# Patient Record
Sex: Female | Born: 1935 | Race: Black or African American | Hispanic: No
Health system: Midwestern US, Community
[De-identification: ages and names within clinical notes are randomized; demographics above are authoritative.]

## PROBLEM LIST (undated history)

## (undated) DIAGNOSIS — I10 Essential (primary) hypertension: Secondary | ICD-10-CM

## (undated) HISTORY — PX: APPENDECTOMY: SHX54

---

## 2014-07-15 ENCOUNTER — Emergency Department (HOSPITAL_COMMUNITY)
Admission: EM | Admit: 2014-07-15 | Discharge: 2014-07-16 | Disposition: A | Discharge status: Home / Self Care | Payer: Medicare Other | Attending: Emergency Medicine | Admitting: Emergency Medicine

## 2014-07-15 ENCOUNTER — Encounter (HOSPITAL_COMMUNITY): Payer: Self-pay | Admitting: Emergency Medicine

## 2014-07-15 DIAGNOSIS — R1013 Epigastric pain: Secondary | ICD-10-CM | POA: Insufficient documentation

## 2014-07-15 DIAGNOSIS — R079 Chest pain, unspecified: Secondary | ICD-10-CM | POA: Diagnosis present

## 2014-07-15 DIAGNOSIS — R1011 Right upper quadrant pain: Secondary | ICD-10-CM | POA: Diagnosis not present

## 2014-07-15 HISTORY — DX: Essential (primary) hypertension: I10

## 2014-07-15 LAB — I-STAT TROPONIN, ED: Troponin i, poc: 0 ng/mL (ref 0.00–0.08)

## 2014-07-15 MED ORDER — PANTOPRAZOLE SODIUM 40 MG IV SOLR
40.0000 mg | Freq: Once | INTRAVENOUS | Status: AC
  Administered 2014-07-16: 40 mg via INTRAVENOUS
  Filled 2014-07-15: qty 40

## 2014-07-15 MED ORDER — ONDANSETRON HCL 4 MG/2ML IJ SOLN
4.0000 mg | Freq: Once | INTRAMUSCULAR | Status: AC
  Administered 2014-07-16: 4 mg via INTRAVENOUS
  Filled 2014-07-15: qty 2

## 2014-07-15 MED ORDER — FENTANYL CITRATE 0.05 MG/ML IJ SOLN
50.0000 ug | Freq: Once | INTRAMUSCULAR | Status: AC
  Administered 2014-07-16: 50 ug via INTRAVENOUS
  Filled 2014-07-15: qty 2

## 2014-07-15 NOTE — ED Notes (Signed)
Pt. reports intermittent epigastric and substernal chest pressure onset this evening with emesis x1, denies SOB , no diaphoresis , pt. stated recent death in the family , pt. Received 1 NTG sl and 4 baby ASA by EMS .

## 2014-07-15 NOTE — ED Provider Notes (Signed)
This chart was scribed for Jenny Maw Zelma Mazariego, DO by Arlan Organ, ED Scribe. This patient was seen in room B17C/B17C and the patient's care was started 11:44 PM.  TIME SEEN: 11:44 PM   CHIEF COMPLAINT:  Chief Complaint  Patient presents with  . Chest Pain     HPI:   HPI Comments: [place] brought in by ambulance is a 81 y.o. female with a PMHx of GERD and HTN who presents to the Emergency Department complaining of intermittent, ongoing, gradually worsening RUQ and eigastric abdominal pain/lower chest pain that radiates to the back onset 7:00 PM this evening. Pain described as "gas pain"/pressure. Discomfort is exacerbated with certain movements and mildly alleviated after vomiting. She also reports nausea and 3 episodes of vomiting since onset. Pt was given Nitro and aspirin en route to ED without any relief for symptoms. No SOB, diaphoresis, dizziness at this time. No history of heart disease or heart failure. No previous cardiac catheterization or stress test. She denies any history of blood clots. She is not a smoker. No PSHx of abdominal surgery other than appendectomy. Pt [place]. States she is planning on staying in [place] for an extended period of time. No known allergies to medications.  ROS: See HPI Constitutional: no fever  Eyes: no drainage  ENT: no runny nose   Cardiovascular:  Positive for chest pain  Resp: no SOB  GI: Positive for nausea and vomiting GU: no dysuria Integumentary: no rash  Allergy: no hives  Musculoskeletal: no leg swelling  Neurological: no slurred speech or dizziness ROS otherwise negative  PAST MEDICAL HISTORY/PAST SURGICAL HISTORY:  No past medical history on file.  MEDICATIONS:  Prior to Admission medications   Not on File    ALLERGIES:  Allergies not on file  SOCIAL HISTORY:  History  Substance Use Topics  . Smoking status: Not on file  . Smokeless tobacco: Not on file  . Alcohol Use: Not on  file    FAMILY HISTORY: No family history on file.  EXAM: There were no vitals taken for this visit. CONSTITUTIONAL: Alert and oriented and responds appropriately to questions. Well-appearing; well-nourished HEAD: Normocephalic EYES: Conjunctivae clear, PERRL ENT: normal nose; no rhinorrhea; moist mucous membranes; pharynx without lesions noted NECK: Supple, no meningismus, no LAD  CARD: RRR; S1 and S2 appreciated; no murmurs, no clicks, no rubs, no gallops RESP: Normal chest excursion without splinting or tachypnea; breath sounds clear and equal bilaterally; no wheezes, no rhonchi, no rales,  ABD/GI: Normal bowel sounds; non-distended; soft; Tenderness to palpation over RUQ with no guarding or rebounding; no peritoneal signs; negative Murphy's sign BACK:  The back appears normal and is non-tender to palpation, there is no CVA tenderness EXT: Normal ROM in all joints; non-tender to palpation; no edema; normal capillary refill; no cyanosis; no calf tenderness or swelling, 2+ DP pulses bilaterally, 2+ radial pulses bilaterally   SKIN: Normal color for age and race; warm NEURO: Moves all extremities equally PSYCH: The patient's mood and manner are appropriate. Grooming and personal hygiene are appropriate.  MEDICAL DECISION MAKING: Patient here with right upper quadrant and epigastric pain that radiates into her chest and back. She has a heart score of 3 secondary to age and history of her blood pressure. Will obtain cardiac labs. EKG shows nonspecific T-wave abnormalities with no old for comparison. This does appear to be gastric in nature versus gallbladder in origin. We'll obtain LFTs, lipase. We'll obtain chest x-ray, right  upper quadrant ultrasound. We'll give Zofran, Protonix, fentanyl and reassess. Have discussed with patient possibility of admission for chest pain rule out she declines this.  ED PROGRESS: Patient's labs are unremarkable including normal LFTs, lipase. 2 negative  troponins. Chest x-ray clear. Abdominal ultrasound normal. Pain completely resolved after GI cocktail. Have offered admission but patient and daughter declined. We'll discharge patient on Protonix. We'll give gastroneurology follow-up information as she may have gastritis. Denies bloody stools or melena. No heavy NSAID or alcohol use. I feel that this is less likely ACS. Discussed strict return precautions. She verbalized understanding and discomfort with plan. [facility] given she is not planning on going back to [place] anytime soon.     EKG Interpretation  Date/Time:  Saturday July 15 2014 23:46:56 EDT Ventricular Rate:  58 PR Interval:  178 QRS Duration: 76 QT Interval:  450 QTC Calculation: 441 R Axis:   -19 Text Interpretation:  Sinus bradycardia Nonspecific T wave abnormality Abnormal ECG No old tracing to compare Confirmed by Ramzi Brathwaite,  DO, Khaliyah Northrop ([id number]) on 07/15/2014 11:55:42 PM       I personally performed the services described in this documentation, which was scribed in my presence. The recorded information has been reviewed and is accurate.   Jenny MawKristen N Ohm Dentler, DO 07/16/14 [phone]

## 2014-07-16 ENCOUNTER — Emergency Department (HOSPITAL_COMMUNITY): Payer: Medicare Other

## 2014-07-16 LAB — CBC WITH DIFFERENTIAL/PLATELET
BASOS PCT: 0 % (ref 0–1)
Basophils Absolute: 0 10*3/uL (ref 0.0–0.1)
Eosinophils Absolute: 0.3 10*3/uL (ref 0.0–0.7)
Eosinophils Relative: 5 % (ref 0–5)
HEMATOCRIT: 36.8 % (ref 36.0–46.0)
HEMOGLOBIN: 12.9 g/dL (ref 12.0–15.0)
Lymphocytes Relative: 32 % (ref 12–46)
Lymphs Abs: 1.7 10*3/uL (ref 0.7–4.0)
MCH: 28 pg (ref 26.0–34.0)
MCHC: 35.1 g/dL (ref 30.0–36.0)
MCV: 79.8 fL (ref 78.0–100.0)
MONO ABS: 0.5 10*3/uL (ref 0.1–1.0)
MONOS PCT: 9 % (ref 3–12)
Neutro Abs: 2.9 10*3/uL (ref 1.7–7.7)
Neutrophils Relative %: 54 % (ref 43–77)
PLATELETS: 142 10*3/uL — AB (ref 150–400)
RBC: 4.61 MIL/uL (ref 3.87–5.11)
RDW: 13.4 % (ref 11.5–15.5)
WBC: 5.4 10*3/uL (ref 4.0–10.5)

## 2014-07-16 LAB — COMPREHENSIVE METABOLIC PANEL
ALBUMIN: 3.8 g/dL (ref 3.5–5.2)
ALT: 22 U/L (ref 0–35)
AST: 31 U/L (ref 0–37)
Alkaline Phosphatase: 54 U/L (ref 39–117)
Anion gap: 9 (ref 5–15)
BUN: 11 mg/dL (ref 6–23)
CALCIUM: 8.9 mg/dL (ref 8.4–10.5)
CO2: 25 mmol/L (ref 19–32)
Chloride: 103 mmol/L (ref 96–112)
Creatinine, Ser: 0.93 mg/dL (ref 0.50–1.10)
GFR calc Af Amer: 64 mL/min — ABNORMAL LOW (ref >90)
GFR calc non Af Amer: 55 mL/min — ABNORMAL LOW (ref >90)
Glucose, Bld: 127 mg/dL — ABNORMAL HIGH (ref 70–99)
Potassium: 3.6 mmol/L (ref 3.5–5.1)
Sodium: 137 mmol/L (ref 135–145)
TOTAL PROTEIN: 6.8 g/dL (ref 6.0–8.3)
Total Bilirubin: 0.4 mg/dL (ref 0.3–1.2)

## 2014-07-16 LAB — I-STAT TROPONIN, ED: Troponin i, poc: 0 ng/mL (ref 0.00–0.08)

## 2014-07-16 LAB — BRAIN NATRIURETIC PEPTIDE: B Natriuretic Peptide: 32 pg/mL (ref 0.0–100.0)

## 2014-07-16 LAB — LIPASE, BLOOD: LIPASE: 23 U/L (ref 11–59)

## 2014-07-16 MED ORDER — GI COCKTAIL ~~LOC~~
30.0000 mL | Freq: Once | ORAL | Status: AC
  Administered 2014-07-16: 30 mL via ORAL
  Filled 2014-07-16: qty 30

## 2014-07-16 MED ORDER — FENTANYL CITRATE 0.05 MG/ML IJ SOLN
INTRAMUSCULAR | Status: AC
  Filled 2014-07-16: qty 2

## 2014-07-16 MED ORDER — PANTOPRAZOLE SODIUM 40 MG PO TBEC: 40.0000 mg | DELAYED_RELEASE_TABLET | Freq: Every day | ORAL | Status: AC

## 2014-07-16 NOTE — Discharge Instructions (Signed)
Gastritis, Adult Gastritis is soreness and swelling (inflammation) of the lining of the stomach. Gastritis can develop as a sudden onset (acute) or long-term (chronic) condition. If gastritis is not treated, it can lead to stomach bleeding and ulcers. CAUSES  Gastritis occurs when the stomach lining is weak or damaged. Digestive juices from the stomach then inflame the weakened stomach lining. The stomach lining may be weak or damaged due to viral or bacterial infections. One common bacterial infection is the Helicobacter pylori infection. Gastritis can also result from excessive alcohol consumption, taking certain medicines, or having too much acid in the stomach.  SYMPTOMS  In some cases, there are no symptoms. When symptoms are present, they may include:  Pain or a burning sensation in the upper abdomen.  Nausea.  Vomiting.  An uncomfortable feeling of fullness after eating. DIAGNOSIS  Your caregiver may suspect you have gastritis based on your symptoms and a physical exam. To determine the cause of your gastritis, your caregiver may perform the following:  Blood or stool tests to check for the H pylori bacterium.  Gastroscopy. A thin, flexible tube (endoscope) is passed down the esophagus and into the stomach. The endoscope has a light and camera on the end. Your caregiver uses the endoscope to view the inside of the stomach.  Taking a tissue sample (biopsy) from the stomach to examine under a microscope. TREATMENT  Depending on the cause of your gastritis, medicines may be prescribed. If you have a bacterial infection, such as an H pylori infection, antibiotics may be given. If your gastritis is caused by too much acid in the stomach, H2 blockers or antacids may be given. Your caregiver may recommend that you stop taking aspirin, ibuprofen, or other nonsteroidal anti-inflammatory drugs (NSAIDs). HOME CARE INSTRUCTIONS  Only take over-the-counter or prescription medicines as directed by  your caregiver.  If you were given antibiotic medicines, take them as directed. Finish them even if you start to feel better.  Drink enough fluids to keep your urine clear or pale yellow.  Avoid foods and drinks that make your symptoms worse, such as:  Caffeine or alcoholic drinks.  Chocolate.  Peppermint or mint flavorings.  Garlic and onions.  Spicy foods.  Citrus fruits, such as oranges, lemons, or limes.  Tomato-based foods such as sauce, chili, salsa, and pizza.  Fried and fatty foods.  Eat small, frequent meals instead of large meals. SEEK IMMEDIATE MEDICAL CARE IF:   You have black or dark red stools.  You vomit blood or material that looks like coffee grounds.  You are unable to keep fluids down.  Your abdominal pain gets worse.  You have a fever.  You do not feel better after 1 week.  You have any other questions or concerns. MAKE SURE YOU:  Understand these instructions.  Will watch your condition.  Will get help right away if you are not doing well or get worse. Document Released: 03/25/2001 Document Revised: 09/30/2011 Document Reviewed: 05/14/2011 [facility] Patient Information 2015 [place], [place]. This information is not intended to replace advice given to you by your health care provider. Make sure you discuss any questions you have with your health care provider.   Food Choices for Gastroesophageal Reflux Disease When you have gastroesophageal reflux disease (GERD), the foods you eat and your eating habits are very important. Choosing the right foods can help ease the discomfort of GERD. WHAT GENERAL GUIDELINES DO I NEED TO FOLLOW?  Choose fruits, vegetables, whole grains, low-fat dairy products, and  low-fat meat, fish, and poultry.  Limit fats such as oils, salad dressings, butter, nuts, and avocado.  Keep a food diary to identify foods that cause symptoms.  Avoid foods that cause reflux. These may be different for different people.  Eat  frequent small meals instead of three large meals each day.  Eat your meals slowly, in a relaxed setting.  Limit fried foods.  Cook foods using methods other than frying.  Avoid drinking alcohol.  Avoid drinking large amounts of liquids with your meals.  Avoid bending over or lying down until 2-3 hours after eating. WHAT FOODS ARE NOT RECOMMENDED? The following are some foods and drinks that may worsen your symptoms: Vegetables Tomatoes. Tomato juice. Tomato and spaghetti sauce. Chili peppers. Onion and garlic. Horseradish. Fruits Oranges, grapefruit, and lemon (fruit and juice). Meats High-fat meats, fish, and poultry. This includes hot dogs, ribs, ham, sausage, salami, and bacon. Dairy Whole milk and chocolate milk. Sour cream. Cream. Butter. Ice cream. Cream cheese.  Beverages Coffee and tea, with or without caffeine. Carbonated beverages or energy drinks. Condiments Hot sauce. Barbecue sauce.  Sweets/Desserts Chocolate and cocoa. Donuts. Peppermint and spearmint. Fats and Oils High-fat foods, including [place] fries and potato chips. Other Vinegar. Strong spices, such as black pepper, white pepper, red pepper, cayenne, curry powder, cloves, ginger, and chili powder. The items listed above may not be a complete list of foods and beverages to avoid. Contact your dietitian for more information. Document Released: 03/31/2005 Document Revised: 04/05/2013 Document Reviewed: 02/02/2013 [facility] Patient Information 2015 [place], [place]. This information is not intended to replace advice given to you by your health care provider. Make sure you discuss any questions you have with your health care provider.   Chest Pain (Nonspecific) It is often hard to give a specific diagnosis for the cause of chest pain. There is always a chance that your pain could be related to something serious, such as a heart attack or a blood clot in the lungs. You need to follow up with your health care  provider for further evaluation. CAUSES   Heartburn.  Pneumonia or bronchitis.  Anxiety or stress.  Inflammation around your heart (pericarditis) or lung (pleuritis or pleurisy).  A blood clot in the lung.  A collapsed lung (pneumothorax). It can develop suddenly on its own (spontaneous pneumothorax) or from trauma to the chest.  Shingles infection (herpes zoster virus). The chest wall is composed of bones, muscles, and cartilage. Any of these can be the source of the pain.  The bones can be bruised by injury.  The muscles or cartilage can be strained by coughing or overwork.  The cartilage can be affected by inflammation and become sore (costochondritis). DIAGNOSIS  Lab tests or other studies may be needed to find the cause of your pain. Your health care provider may have you take a test called an ambulatory electrocardiogram (ECG). An ECG records your heartbeat patterns over a 24-hour period. You may also have other tests, such as:  Transthoracic echocardiogram (TTE). During echocardiography, sound waves are used to evaluate how blood flows through your heart.  Transesophageal echocardiogram (TEE).  Cardiac monitoring. This allows your health care provider to monitor your heart rate and rhythm in real time.  Holter monitor. This is a portable device that records your heartbeat and can help diagnose heart arrhythmias. It allows your health care provider to track your heart activity for several days, if needed.  Stress tests by exercise or by giving medicine that makes the  heart beat faster. TREATMENT   Treatment depends on what may be causing your chest pain. Treatment may include:  Acid blockers for heartburn.  Anti-inflammatory medicine.  Pain medicine for inflammatory conditions.  Antibiotics if an infection is present.  You may be advised to change lifestyle habits. This includes stopping smoking and avoiding alcohol, caffeine, and chocolate.  You may be advised  to keep your head raised (elevated) when sleeping. This reduces the chance of acid going backward from your stomach into your esophagus. Most of the time, nonspecific chest pain will improve within 2-3 days with rest and mild pain medicine.  HOME CARE INSTRUCTIONS   If antibiotics were prescribed, take them as directed. Finish them even if you start to feel better.  For the next few days, avoid physical activities that bring on chest pain. Continue physical activities as directed.  Do not use any tobacco products, including cigarettes, chewing tobacco, or electronic cigarettes.  Avoid drinking alcohol.  Only take medicine as directed by your health care provider.  Follow your health care provider's suggestions for further testing if your chest pain does not go away.  Keep any follow-up appointments you made. If you do not go to an appointment, you could develop lasting (chronic) problems with pain. If there is any problem keeping an appointment, call to reschedule. SEEK MEDICAL CARE IF:   Your chest pain does not go away, even after treatment.  You have a rash with blisters on your chest.  You have a fever. SEEK IMMEDIATE MEDICAL CARE IF:   You have increased chest pain or pain that spreads to your arm, neck, jaw, back, or abdomen.  You have shortness of breath.  You have an increasing cough, or you cough up blood.  You have severe back or abdominal pain.  You feel nauseous or vomit.  You have severe weakness.  You faint.  You have chills. This is an emergency. Do not wait to see if the pain will go away. Get medical help at once. Call your local emergency services (911 in [place] Do not drive yourself to the hospital. MAKE SURE YOU:   Understand these instructions.  Will watch your condition.  Will get help right away if you are not doing well or get worse. Document Released: 01/08/2005 Document Revised: 04/05/2013 Document Reviewed: 11/04/2007 [facility] Patient  Information 2015 [place], [place]. This information is not intended to replace advice given to you by your health care provider. Make sure you discuss any questions you have with your health care provider.

## 2014-07-16 NOTE — ED Notes (Signed)
Patient transported to X-ray 

## 2014-07-16 NOTE — ED Notes (Signed)
MD at bedside. 

## 2015-10-28 NOTE — Nursing Note (Signed)
Nursing Discharge Summary - Text       Nursing Discharge Summary Entered On:  10/28/2015 10:13 EDT    Performed On:  10/28/2015 10:12 EDT by Daphine DeutscherMARTIN, RN, Apolinar JunesBRANDON               DC Information   Discharge To, Anticipated :   Home independently   Mode of Discharge :   Ambulatory   Transportation :   Private vehicle   Accompanied By :   Family member   [place], RN, Apolinar JunesBRANDON - 10/28/2015 10:12 EDT   Education   Responsible Learner(s) :   No Data Available     Barriers To Learning :   None evident   Teaching Method :   Explanation, Printed materials   Roylene ReasonMARTIN, RN, BRANDON - 10/28/2015 10:12 EDT   Post-Hospital Education Adult Grid   Importance of Follow-Up Visits :   Verbalizes understanding   Pain Management :   Verbalizes understanding   Plan of Care :   Verbalizes understanding   [place], RApolinar Junes, BRANDON - 10/28/2015 10:12 EDT   Medication Education Adult Grid   Med Dosage, Route, Scheduling :   TEFL teacherVerbalizes understanding   Med Generic/Brand Name, Purpose, Action :   Verbalizes understanding   Medication Precautions :   Verbalizes understanding   Daphine DeutscherMARTIN, RApolinar Junes, BRANDON - 10/28/2015 10:12 EDT   Education Referral Made To :   Physician Specialist, Primary Care Physician   [place], RN, Apolinar JunesBRANDON - 10/28/2015 10:12 EDT

## 2017-06-24 NOTE — Nursing Note (Signed)
Orthostatics - Text       Orthostatics Entered On:  06/24/2017 18:54 EDT    Performed On:  06/24/2017 18:45 EDT by Patrick NorthAllen,  Katie H               Orthostatics   Systolic/Diastolic  Supine BP :   228 mmHg (>HHI)    Systolic/Diastolic  Supine BP :   100 mmHg (HI)    Pulse Supine :   66 bpm   Systolic/Diastolic  Standing BP :   210 mmHg (>HHI)    Systolic/Diastolic  Standing BP :   103 mmHg (>HHI)    Pulse Standing :   69 bpm   Systolic/Diastolic  Sitting BP :   216 mmHg (>HHI)    Systolic/Diastolic  Sitting BP :   98 mmHg (HI)    Pulse Sitting :   68 bpm   Comments :   pt got lightheaded on standing   Patrick Northllen,  Katie H - 06/24/2017 18:45 EDT

## 2017-06-24 NOTE — Nursing Note (Signed)
Medication Administration Follow Up-Text       Medication Administration Follow Up Entered On:  06/24/2017 21:42 EDT    Performed On:  06/24/2017 21:42 EDT by Donivan Scull, RN, Anderson Malta      Intervention Information:     ondansetron  Performed by Ace, RN, Anderson Malta on 06/24/2017 21:11:00 EDT       ondansetron,4mg   IV Push,Wrist, Right       Med Response   ED Medication Response :   No adverse reaction, Symptoms improved   Ace, RN, Anderson Malta - 06/24/2017 21:42 EDT

## 2017-06-25 NOTE — Progress Notes (Signed)
Outpatient OT Certification Letter-Text       OT Outpatient Certification Letter Entered On:  06/25/2017 10:53 EDT    Performed On:  06/25/2017 10:51 EDT by Crisoforo OxfordAMMONS, OT, STEPHEN               Physician Certification   Date of Injury or Surgery :   06/24/2017 EDT   Ordering Physician Name :   Kerin SalenGOYAL-MD,  ABHINAV   Number of Visits This Interval :   1   Dear Physician :   Thank you for your referral. Below is the patient information for the stated interval of treatment. Please review, modify (if necessary), sign, and return. Thank you.   Date of Evaluation :   06/25/2017 EDT   OT Certification Interval End :   07/26/2017 EDT   Physician Signature Required :   Yes   Staff Physician Signature :   The physician's electronic signature noted above indicates approval of the documented Plan of Care for the stated interval.   AMMONS, OT, STEPHEN - 06/25/2017 10:51 EDT   Problem List   (As Of: 06/25/2017 10:53:01 EDT)   Problems(Active)    At risk for falls (SNOMED CT  :[id number] )  Name of Problem:   At risk for falls ; Onset Date:   06/25/2017 ; Recorder:   SYSTEM,  SYSTEM; Confirmation:   Confirmed ; Classification:   Interdisciplinary ; Code:   [id number] ; Last Updated:   06/25/2017 2:38 EDT ; Life Cycle Date:   06/25/2017 ; Life Cycle Status:   Active ; Vocabulary:   SNOMED CT   ; Comments:        06/25/2017 2:38 - SYSTEM,  SYSTEM  Problem added by Discern Expert      HLD (hyperlipidemia) (SNOMED CT  :[id number] )  Name of Problem:   HLD (hyperlipidemia) ; Recorder:   BELLEW, RN, KRISTY M; Confirmation:   Confirmed ; Classification:   Patient Stated ; Code:   [id number] ; Contributor System:   PowerChart ; Last Updated:   06/24/2017 16:16 EDT ; Life Cycle Date:   06/24/2017 ; Life Cycle Status:   Active ; Vocabulary:   SNOMED CT        HTN (hypertension) (SNOMED CT  [id number] )  Name of Problem:   HTN (hypertension) ; Recorder:   Daphine DeutscherMARTIN, RN, Apolinar JunesBRANDON; Confirmation:   Confirmed ; Classification:   Medical ; Code:    [id number] ; Contributor System:   PowerChart ; Last Updated:   10/28/2015 8:22 EDT ; Life Cycle Date:   10/28/2015 ; Life Cycle Status:   Active ; Vocabulary:   SNOMED CT          Diagnoses(Active)    Dizziness  Date:   06/24/2017 ; Diagnosis Type:   Reason For Visit ; Confirmation:   Complaint of ; Clinical Dx:   Dizziness ; Classification:   Medical ; Clinical Service:   Emergency medicine ; Code:   PNED ; Probability:   0 ; Diagnosis Code:   [id number]      Dizziness  Date:   06/24/2017 ; Diagnosis Type:   Discharge ; Confirmation:   Confirmed ; Clinical Dx:   Dizziness ; Classification:   Medical ; Clinical Service:   Non-Specified ; Code:   ICD-10-CM ; Probability:   0 ; Diagnosis Code:   R42      Epigastric pain  Date:   06/24/2017 ; Diagnosis Type:   Discharge ; Confirmation:   Confirmed ; Clinical Dx:  Epigastric pain ; Classification:   Medical ; Clinical Service:   Non-Specified ; Code:   ICD-10-CM ; Probability:   0 ; Diagnosis Code:   R10.13      Unsteadiness on feet  Date:   06/25/2017 ; Diagnosis Type:   Other ; Confirmation:   Differential ; Clinical Dx:   Unsteadiness on feet ; Classification:   Interdisciplinary ; Clinical Service:   Non-Specified ; Code:   ICD-10-CM ; Probability:   0 ; Diagnosis Code:   R26.81        Plan   OT Evaluation Date :   06/25/2017 EDT   OT Frequency Rehab :   Mo/Tu/We/Th/Fr   OT Duration Unit Rehab :   Days   OT Duration Rehab :   30   OT Anticipated Treatments :   Activity of daily living training, Balance training, Mobility training, Pain management, Patient/Caregiver education, Safety education, Therapeutic exercises   OT Certification Letter Complete :   Yes   AMMONS, OT, STEPHEN - 06/25/2017 10:51 EDT   Outpatient Review   Rehab Potential Occupational Therapy :   Good   OT Impairments or Limitations :   Balance deficits, Basic activity of daily living deficits, Cognitive deficits, Endurance deficits, Mobility deficits,  Pain, Strength deficits   OT Clinical Assessment Summary :   Pt may benefit from rehab upon d/c.     AMMONS, OT, STEPHEN - 06/25/2017 10:51 EDT    Signature Line                                                 Electronically Signed On 06/25/17 10:51 AM                                               _________________________________________________                                               Austin Miles, STEPHEN                                                     Electronically Signed On 07/03/17 07:51 PM                                               _________________________________________________                                                Kerin Salen                  Dicatation Date: 06/25/17 10:51 AM

## 2017-06-25 NOTE — Nursing Note (Signed)
Adult Patient History Form-Text       [name]:  06/25/2017 1:05 EDT    Performed On:  06/25/2017 1:01 EDT by Sharol Roussel, RN, [place]   Patient Identified :   Identification band, Verbal   Information Given By :   Self   Preferred Mode of Communication :   Verbal   Accompanied By :   Denman George   Pregnancy Status :   N/A   Has the patient received chemotherapy or biotherapy within the last 48 hours? :   No   In Clinical Trial With Signed Consent for Related Condition :   N/A   Is the patient currently (2-3 days) receiving radiation treatment? :   No   Sharol Roussel, RN, Collene Schlichter - 06/25/2017 1:01 EDT   Allergies   (As Of: 06/25/2017 01:05:08 EDT)   Allergies (Active)   Chocolate  Estimated Onset Date:   Unspecified ; Created By:   Montez Morita RN, KRISTINA A; Reaction Status:   Active ; Category:   Drug ; Substance:   Chocolate ; Type:   Allergy ; Severity:   Moderate ; Updated By:   Montez Morita RN, Caryl Never; Reviewed Date:   06/25/2017 1:01 EDT      Peaches  Estimated Onset Date:   Unspecified ; Created By:   Montez Morita RN, KRISTINA A; Reaction Status:   Active ; Category:   Drug ; Substance:   Peaches ; Type:   Allergy ; Severity:   Moderate ; Updated By:   Montez Morita RN, Caryl Never; Reviewed Date:   06/25/2017 1:01 EDT        Medication History   Medication List   (As Of: 06/25/2017 01:05:08 EDT)   Normal Order    A Patient Specific Medication  :   A Patient Specific Medication ; Status:   Ordered ; Ordered As Mnemonic:   A Patient Specific Medication ; Simple Display Line:   1 EA, Kit-Combo, q2mn, PRN: other (see comment) ; Ordering Provider:   GTyron Russell Catalog Code:   A Patient Specific Medication ; Order Dt/Tm:   06/25/2017 00:57:39 ; Comment:   to access the patient specific medication drawer          A Patient Specific Refrigerated Medication  :   A Patient Specific Refrigerated Medication ; Status:   Ordered ; Ordered As Mnemonic:   A Patient Specific Refrigerated Medication ;  Simple Display Line:   1 EA, Kit-Combo, q566m, PRN: other (see comment) ; Ordering Provider:   GOTyron RussellCatalog Code:   A Patient Specific Refrigerated Medicati ; Order Dt/Tm:   06/25/2017 00:57:40 ; Comment:   to access the patient specific Refrigerated medications          acetaminophen 325 mg Tab  :   acetaminophen 325 mg Tab ; Status:   Ordered ; Ordered As Mnemonic:   acetaminophen ; Simple Display Line:   650 mg, 2 tabs, Oral, q4hr, PRN: mild pain (1-3) ; Ordering Provider:   GOTyron RussellCatalog Code:   acetaminophen ; Order Dt/Tm:   06/25/2017 00:11:29          aluminum hydroxide/magnesium hydroxide/simethicone 200 mg-200 mg-20 mg/5 mL  :   aluminum hydroxide/magnesium hydroxide/simethicone 200 mg-200 mg-20 mg/5 mL ; Status:   Ordered ; Ordered As Mnemonic:   aluminum hydroxide/magnesium hydroxide/simethicone 200 mg-200 mg-20 mg/5 mL oral suspension ;  Simple Display Line:   30 mL, Oral, q3hr, PRN: indigestion ; Ordering Provider:   Tyron Russell; Catalog Code:   Al hydroxide/Mg hydroxide/simethicone ; Order Dt/Tm:   06/25/2017 00:11:30          amLODIPine 5 mg Tab  :   amLODIPine 5 mg Tab ; Status:   Ordered ; Ordered As Mnemonic:   amLODIPine ; Simple Display Line:   10 mg, 2 tabs, Oral, Daily ; Ordering Provider:   Tyron Russell; Catalog Code:   amLODIPine ; Order Dt/Tm:   06/25/2017 00:10:05          famotidine 10 mg/mL IV Soln 2 mL  :   famotidine 10 mg/mL IV Soln 2 mL ; Status:   Completed ; Ordered As Mnemonic:   Pepcid ; Simple Display Line:   20 mg, 2 mL, IV Push, Once ; Ordering Provider:   YOUNG, DO, KELLI M; Catalog Code:   famotidine ; Order Dt/Tm:   06/24/2017 20:57:24          hydrALAZINE 25 mg Tab  :   hydrALAZINE 25 mg Tab ; Status:   Ordered ; Ordered As Mnemonic:   hydrALAZINE ; Simple Display Line:   50 mg, 2 tabs, Oral, TID ; Ordering Provider:   Tyron Russell; Catalog Code:   hydrALAZINE ; Order Dt/Tm:   06/25/2017 00:09:59 ; Comment:   SOUND ALIKE / LOOK ALIKE  - VERIFY DRUG          hydrALAZINE 20 mg/mL Inj Soln 1 mL  :   hydrALAZINE 20 mg/mL Inj Soln 1 mL ; Status:   Ordered ; Ordered As Mnemonic:   hydrALAZINE ; Simple Display Line:   10 mg, 0.5 mL, IV Push, q6hr, PRN: hypertension ; Ordering Provider:   Tyron Russell; Catalog Code:   hydrALAZINE ; Order Dt/Tm:   06/25/2017 00:10:25 ; Comment:   Provider should specify BP parameters at order entry to meet [facility] standards  SOUND ALIKE / LOOK ALIKE - VERIFY DRUG    give for sbp>170          hydrALAZINE 20 mg/mL Inj Soln 1 mL  :   hydrALAZINE 20 mg/mL Inj Soln 1 mL ; Status:   Completed ; Ordered As Mnemonic:   hydrALAZINE ; Simple Display Line:   10 mg, 0.5 mL, IV Push, Once ; Ordering Provider:   YOUNG, DO, KELLI M; Catalog Code:   hydrALAZINE ; Order Dt/Tm:   06/24/2017 22:03:00          hydrALAZINE 20 mg/mL Inj Soln 1 mL  :   hydrALAZINE 20 mg/mL Inj Soln 1 mL ; Status:   Completed ; Ordered As Mnemonic:   hydrALAZINE ; Simple Display Line:   10 mg, 0.5 mL, IV Push, Once ; Ordering Provider:   YOUNG, DO, KELLI M; Catalog Code:   hydrALAZINE ; Order Dt/Tm:   06/24/2017 19:00:53          lidocaine 1% PF Inj Soln 2 mL  :   lidocaine 1% PF Inj Soln 2 mL ; Status:   Ordered ; Ordered As Mnemonic:   lidocaine 1% preservative-free injectable solution ; Simple Display Line:   0.5 mL, ID, q2mn, PRN: other (see comment) ; Ordering Provider:   GTyron Russell Catalog Code:   lidocaine ; Order Dt/Tm:   06/25/2017 00:57:40 ; Comment:   to access lidocaine 1%  2 mL vial for IV start and Life Port access  meclizine 12.5 mg Tab  :   meclizine 12.5 mg Tab ; Status:   Ordered ; Ordered As Mnemonic:   meclizine ; Simple Display Line:   25 mg, 2 tabs, Oral, TID, PRN: dizziness ; Ordering Provider:   Tyron Russell; Catalog Code:   meclizine ; Order Dt/Tm:   06/25/2017 00:12:43          meclizine 12.5 mg Tab  :   meclizine 12.5 mg Tab ; Status:   Completed ; Ordered As Mnemonic:   meclizine ; Simple Display Line:   25 mg, 2  tabs, Oral, Once ; Ordering Provider:   YOUNG, DO, KELLI M; Catalog Code:   meclizine ; Order Dt/Tm:   06/24/2017 23:35:40          ondansetron 2 mg/mL Inj Soln 2 mL  :   ondansetron 2 mg/mL Inj Soln 2 mL ; Status:   Ordered ; Ordered As Mnemonic:   ondansetron ; Simple Display Line:   4 mg, 2 mL, IV Push, q6hr, PRN: nausea/vomiting ; Ordering Provider:   Tyron Russell; Catalog Code:   ondansetron ; Order Dt/Tm:   06/25/2017 00:11:29          ondansetron 2 mg/mL Inj Soln 2 mL  :   ondansetron 2 mg/mL Inj Soln 2 mL ; Status:   Completed ; Ordered As Mnemonic:   Zofran ; Simple Display Line:   4 mg, 2 mL, IV Push, Once ; Ordering Provider:   YOUNG, DO, KELLI M; Catalog Code:   ondansetron ; Order Dt/Tm:   06/24/2017 13:08:65          potassium chloride 20 mEq ER Tab  :   potassium chloride 20 mEq ER Tab ; Status:   Ordered ; Ordered As Mnemonic:   potassium chloride 20 mEq oral tablet, extended release ; Simple Display Line:   40 mEq, 2 tabs, Oral, Once ; Ordering Provider:   Tyron Russell; Catalog Code:   potassium chloride ; Order Dt/Tm:   06/25/2017 00:12:03 ; Comment:   DO NOT CRUSH          promethazine 6.25 mg/5 mL Oral Syrup 480 mL  :   promethazine 6.25 mg/5 mL Oral Syrup 480 mL ; Status:   Ordered ; Ordered As Mnemonic:   promethazine ; Simple Display Line:   6.25 mg, 5 mL, Oral, q6hr, PRN: nausea/vomiting ; Ordering Provider:   Tyron Russell; Catalog Code:   promethazine ; Order Dt/Tm:   06/25/2017 00:11:30 ; Comment:   if nausea unrelieved by ondansetron (zofran) give promethazine (phenergan)          Respiratory MDI Treatment  :   Respiratory MDI Treatment ; Status:   Ordered ; Ordered As Mnemonic:   Respiratory MDI Treatment ; Simple Display Line:   1 EA, Kit-Combo, q20mn, PRN: other (see comment) ; Ordering Provider:   GTyron Russell Catalog Code:   Respiratory MDI Treatment ; Order Dt/Tm:   06/25/2017 00:57:40          simvastatin 20 mg Tab  :   simvastatin 20 mg Tab ; Status:   Ordered ;  Ordered As Mnemonic:   simvastatin ; Simple Display Line:   20 mg, 1 tabs, Oral, Once a Day (at bedtime) ; Ordering Provider:   GTyron Russell Catalog Code:   simvastatin ; Order Dt/Tm:   06/25/2017 00:10:12          Sodium Chloride 0.9% intravenous solution Bolus  :  Sodium Chloride 0.9% intravenous solution Bolus ; Status:   Completed ; Ordered As Mnemonic:   Sodium Chloride 0.9% bolus ; Simple Display Line:   1,000 mL, 1000 mL/hr, IV Piggyback, Once ; Ordering Provider:   YOUNG, DO, KELLI M; Catalog Code:   Sodium Chloride 0.9% ; Order Dt/Tm:   06/24/2017 19:48:06          sodium chloride 0.9% Inj Soln 10 mL syringe  :   sodium chloride 0.9% Inj Soln 10 mL syringe ; Status:   Ordered ; Ordered As Mnemonic:   sodium chloride 0.9% flush syringe range dose ; Simple Display Line:   30 mL, IV Push, q14mn, PRN: other (see comment) ; Ordering Provider:   GTyron Russell Catalog Code:   sodium chloride flush ; Order Dt/Tm:   06/25/2017 00:57:40 ; Comment:   for access to sodium chloride 0.9% syringe for INT flush if needed          sodium chloride 0.9% Inj Soln 10 mL vial  :   sodium chloride 0.9% Inj Soln 10 mL vial ; Status:   Ordered ; Ordered As Mnemonic:   sodium chloride 0.9% vial for reconstitution range dose ; Simple Display Line:   30 mL, IV Push, q566m, PRN: other (see comment) ; Ordering Provider:   GOTyron RussellCatalog Code:   sodium chloride flush ; Order Dt/Tm:   06/25/2017 00:57:40 ; Comment:   for access to sodium chloride 0.9% vial when needed as a diluent for reconstitutable medications          sterile water Inj Soln 10 mL  :   sterile water Inj Soln 10 mL ; Status:   Ordered ; Ordered As Mnemonic:   sterile water for reconstitution ; Simple Display Line:   10 mL, N/A, q5m4m PRN: other (see comment) ; Ordering Provider:   GOYTyron Russellatalog Code:   sterile water for reconstitution ; Order Dt/Tm:   06/25/2017 00:57:40 ; Comment:   to access sterile water when needed as a diluent  for reconstitutable medications. Not for IV use.            Home Meds    atorvastatin  :   atorvastatin ; Status:   Documented ; Ordered As Mnemonic:   atorvastatin 40 mg oral tablet ; Simple Display Line:   40 mg, 1 tabs, Oral, Daily, 0 Refill(s) ; Ordering Provider:   FRAStorm Friskatalog Code:   atorvastatin ; Order Dt/Tm:   10/28/2015 08:23:11          hydrALAZINE  :   hydrALAZINE ; Status:   Documented ; Ordered As Mnemonic:   hydrALAZINE 50 mg oral tablet ; Simple Display Line:   0 Refill(s) ; Ordering Provider:   FRAStorm Friskatalog Code:   hydrALAZINE ; Order Dt/Tm:   06/24/2017 16:16:33          Misc Medication  :   Misc Medication ; Status:   Completed ; Ordered As Mnemonic:   Misc Medication ; Simple Display Line:   0 Refill(s) ; Ordering Provider:   FRAStorm Friskatalog Code:   Misc Medication ; Order Dt/Tm:   10/28/2015 08:23:11            Problem History   (As Of: 06/25/2017 01:05:08 EDT)   Problems(Active)    HLD (hyperlipidemia) (SNOMED CT  :[id number] Name of Problem:   HLD (hyperlipidemia) ; Recorder:   BELLEW, RN, KRISTY  M; Confirmation:   Confirmed ; Classification:   Patient Stated ; Code:   [id number] ; Contributor System:   PowerChart ; Last Updated:   06/24/2017 16:16 EDT ; Life Cycle Date:   06/24/2017 ; Life Cycle Status:   Active ; Vocabulary:   SNOMED CT        HTN (hypertension) (SNOMED CT  [phone] )  Name of Problem:   HTN (hypertension) ; Recorder:   Hassell Done, RN, Erlene Quan; Confirmation:   Confirmed ; Classification:   Medical ; Code:   [id number] ; Contributor System:   PowerChart ; Last Updated:   10/28/2015 8:22 EDT ; Life Cycle Date:   10/28/2015 ; Life Cycle Status:   Active ; Vocabulary:   SNOMED CT          Diagnoses(Active)    Dizziness  Date:   06/24/2017 ; Diagnosis Type:   Reason For Visit ; Confirmation:   Complaint of ; Clinical Dx:   Dizziness ; Classification:   Medical ; Clinical Service:    Emergency medicine ; Code:   PNED ; Probability:   0 ; Diagnosis Code:   [id number]      Dizziness  Date:   06/24/2017 ; Diagnosis Type:   Discharge ; Confirmation:   Confirmed ; Clinical Dx:   Dizziness ; Classification:   Medical ; Clinical Service:   Non-Specified ; Code:   ICD-10-CM ; Probability:   0 ; Diagnosis Code:   R42      Epigastric pain  Date:   06/24/2017 ; Diagnosis Type:   Discharge ; Confirmation:   Confirmed ; Clinical Dx:   Epigastric pain ; Classification:   Medical ; Clinical Service:   Non-Specified ; Code:   ICD-10-CM ; Probability:   0 ; Diagnosis Code:   R10.13        Procedure History        -    Procedure History   (As Of: 06/25/2017 01:05:08 EDT)     Immunizations   Influenza Vaccine Status :   Not received   Last Tetanus :   Unknown   Bohman, RN, Collene Schlichter - 06/25/2017 1:01 EDT   ID Risk Screen Symptoms   CRE Screening :   Not applicable   GAME, RN, JANA C - 06/25/2017 7:50 EDT   Recent Travel History :   No recent travel   TB Symptom Screen :   No symptoms   C. diff Symptom/History ID :   Neither of the above   Patient Pregnant :   None of the above   MRSA/VRE Screening :   None of these apply   Charyl Dancer - 06/25/2017 1:01 EDT   Bloodless Medicine   Will Patient Accept Blood Transfusion and/or Blood Products :   Yes   Sharol Roussel, RN, Collene Schlichter - 06/25/2017 1:01 EDT   Nutrition   Nutritional Risk Factors :   None   Unintentional Weight Change Greater Than 10 lbs in the Last 6 Months :   No   Sharol Roussel RNCollene Schlichter - 06/25/2017 1:01 EDT   Functional   Sensory Deficits :   None   ADLs Prior to Admission :   Independent   Charyl Dancer - 06/25/2017 1:01 EDT   Social History   Social History   (As Of: 06/25/2017 01:05:08 EDT)   Tobacco:        Never smoker   (Last Updated: 10/28/2015 40:97:35 EDT by Hassell Done [place], Erlene Quan)  Spiritual   Faith/Denomination :   Protestant   Do you have a concern that you would like to address with a Chaplain? :   Yes    Bohman, RN, Collene Schlichter - 06/25/2017 1:01 EDT   Harm Screen   Injuries/Abuse/Neglect in Household :   Denies   Feels Unsafe at Home :   No   Suicidal Behavior :   None   Self Harming Behavior :   None   Suicidal Ideation :   None   Sharol Roussel, RN, Collene Schlichter - 06/25/2017 1:01 EDT   Advance Directive   Advance Directive :   No   Patient Wishes to Receive Further Information on Advance Directives :   Yes   Charyl Dancer - 06/25/2017 1:01 EDT   Education   Written Language :   Cleophus Molt   Caregiver/Advocate Primary Language :   Cleophus Molt   Caregiver/Advocate Written Language :   Cleophus Molt   Primary Language :   Janene Madeira, RN, Collene Schlichter - 06/25/2017 1:01 EDT   Caregiver/Advocate Language   Patient :   Verbal explanation   Family :   Verbal explanation   Sharol Roussel, RN, Collene Schlichter - 06/25/2017 1:01 EDT   Barriers to Learning :   None evident   Teaching Method :   Demonstration, Explanation, Printed materials   Responsible Learner Present for Session :   No   Sharol Roussel, RN, Collene Schlichter - 06/25/2017 1:01 EDT   DCP GENERIC CODE   Unit/Room Orientation :   Rosezena Sensor understanding   Environmental Safety :   Rosezena Sensor understanding   Hand Washing :   Verbalizes understanding   Infection Prevention :   Verbalizes understanding   DVT Prophylaxis :   Verbalizes understanding   Isolation Precaution :   Verbalizes understanding   Bohman, RN, Collene Schlichter - 06/25/2017 1:01 EDT   DC Needs   Living Situation :   Home independently   Anticipated Discharge Needs :   None   Bohman, RN, Collene Schlichter - 06/25/2017 1:01 EDT   Valuables and Belongings   Does Patient Have Valuables and Belongings :   Yes   Bohman, RN, Delilah Shan N - 06/25/2017 1:01 EDT   DCP GENERIC CODE   At Bedside :   Clothes   Bohman, RN, Collene Schlichter - 06/25/2017 1:01 EDT   Admission Complete   Admission Complete :   Yes   Bohman, RN, Kendall N - 06/25/2017 1:01 EDT

## 2017-06-25 NOTE — Progress Notes (Signed)
 Inpatient OT Evaluation - Text       Inpatient OT Evaluation Entered On:  06/25/2017 10:51 EDT    Performed On:  06/25/2017 10:42 EDT by AMMONS, OT, STEPHEN               Reason for Treatment   Subjective Statement :   Evaluation complete     *Reason for Referral :   Syncope     *Chief Complaint :   Dizziness     AMMONS, OT, STEPHEN - 06/25/2017 10:42 EDT   General Information   Occupational Therapy Orders :   Occupational Therapy Evaluation and Treatment Inpatient Acute - 06/25/17 1:11:00 EDT, Stop date 06/25/17 1:11:00 EDT     Precautions RTF :   Precaution Orders   No qualifying data available.     Pain Present :   Yes actual or suspected pain   Orientation Assessment :   Oriented x 4   Affect/Behavior :   Appropriate, Cooperative   AMMONS, OT, STEPHEN - 06/25/2017 10:42 EDT   Problem List   (As Of: 06/25/2017 10:51:17 EDT)   Problems(Active)    At risk for falls (SNOMED CT  :791316981 )  Name of Problem:   At risk for falls ; Onset Date:   06/25/2017 ; Recorder:   SYSTEM,  SYSTEM; Confirmation:   Confirmed ; Classification:   Interdisciplinary ; Code:   [id number] ; Last Updated:   06/25/2017 2:38 EDT ; Life Cycle Date:   06/25/2017 ; Life Cycle Status:   Active ; Vocabulary:   SNOMED CT   ; Comments:        06/25/2017 2:38 - SYSTEM,  SYSTEM  Problem added by Discern Expert      HLD (hyperlipidemia) (SNOMED CT  :07173982 )  Name of Problem:   HLD (hyperlipidemia) ; Recorder:   BELLEW, RN, KRISTY M; Confirmation:   Confirmed ; Classification:   Patient Stated ; Code:   [id number] ; Contributor System:   PowerChart ; Last Updated:   06/24/2017 16:16 EDT ; Life Cycle Date:   06/24/2017 ; Life Cycle Status:   Active ; Vocabulary:   SNOMED CT        HTN (hypertension) (SNOMED CT  :[phone] )  Name of Problem:   HTN (hypertension) ; Recorder:   GLADIS, RN, PENNE; Confirmation:   Confirmed ; Classification:   Medical ; Code:   [phone] ; Contributor System:   PowerChart ;  Last Updated:   10/28/2015 8:22 EDT ; Life Cycle Date:   10/28/2015 ; Life Cycle Status:   Active ; Vocabulary:   SNOMED CT          Diagnoses(Active)    Dizziness  Date:   06/24/2017 ; Diagnosis Type:   Reason For Visit ; Confirmation:   Complaint of ; Clinical Dx:   Dizziness ; Classification:   Medical ; Clinical Service:   Emergency medicine ; Code:   PNED ; Probability:   0 ; Diagnosis Code:   [id number]      Dizziness  Date:   06/24/2017 ; Diagnosis Type:   Discharge ; Confirmation:   Confirmed ; Clinical Dx:   Dizziness ; Classification:   Medical ; Clinical Service:   Non-Specified ; Code:   ICD-10-CM ; Probability:   0 ; Diagnosis Code:   R42      Epigastric pain  Date:   06/24/2017 ; Diagnosis Type:   Discharge ; Confirmation:   Confirmed ; Clinical Dx:   Epigastric pain ; Classification:  Medical ; Clinical Service:   Non-Specified ; Code:   ICD-10-CM ; Probability:   0 ; Diagnosis Code:   R10.13      Unsteadiness on feet  Date:   06/25/2017 ; Diagnosis Type:   Other ; Confirmation:   Differential ; Clinical Dx:   Unsteadiness on feet ; Classification:   Interdisciplinary ; Clinical Service:   Non-Specified ; Code:   ICD-10-CM ; Probability:   0 ; Diagnosis Code:   R26.81        Pain Assessment   Pain Location :   Chest   Quality :   Pressure, Tightness   Time Pattern :   Intermittent   AMMONS, OT, STEPHEN - 06/25/2017 10:42 EDT   OT Basic ADL   Basic ADL Grid   Eating :   Supervision or setup   Grooming :   Supervision or setup   Bathing :   Contact guard assistance   UE Dressing :   Supervision or setup   LE Dressing :   Contact guard assistance   AMMONS, OT, STEPHEN - 06/25/2017 10:42 EDT   Limiting Factors :   Pain, Other: Dizziness   AMMONS, OT, STEPHEN - 06/25/2017 10:42 EDT   UE Strength/ROM   Upper Extremity Overall ROM Grid   Left Upper Extremity Passive Range :   Within functional limits   Left Upper Extremity Active Range :   Within functional limits   Right Upper Extremity  Passive Range :   Within functional limits   Right Upper Extremity Active Range :   Within functional limits   AMMONS, OT, STEPHEN - 06/25/2017 10:42 EDT   Lt Upper Extremity Strength :   Within functional limits   Rt Upper Extremity Strength :   Within functional limits   AMMONS, OT, STEPHEN - 06/25/2017 10:42 EDT   Long Term Goals   Outpatient OT Long Term Goals Rehab     Long Term Goal 1  Long Term Goal 2  Long Term Goal 3      Goal :    Maximize Indep with UE/LE ADLs   Maximize Indep with toilet t/f   Maximize B UE strength/endurance          AMMONS, OT, STEPHEN - 06/25/2017 10:42 EDT  AMMONS, OT, STEPHEN - 06/25/2017 10:42 EDT  AMMONS, OT, STEPHEN - 06/25/2017 10:42 EDT       OT LT Goals Reviewed :   Yes   AMMONS, OT, STEPHEN - 06/25/2017 10:42 EDT   Short Term Goals   Eating Goal Grid     Goal #1          Assist :    Complete independence                AMMONS, OT, STEPHEN - 06/25/2017 10:42 EDT         Grooming Goal Grid     Goal #1          Assist :    Complete independence                AMMONS, OT, STEPHEN - 06/25/2017 10:42 EDT         Upper Body Dressing Short Term Goal Grid     Goal #1          Assist :    Setup                AMMONS, OT, STEPHEN - 06/25/2017 10:42 EDT  Lower Body Dressing Grid     Goal #1          Assist :    Close supervision                AMMONS, OT, STEPHEN - 06/25/2017 10:42 EDT         Bathing Goal Grid     Goal #1          Assist :    Close supervision                AMMONS, OT, STEPHEN - 06/25/2017 10:42 EDT         Toileting and Transfers Goal Grid     Goal #1          Activity :    Toilet transfers              Assist :    Close supervision              Transfer Equipment :    Vannie REAP, [id number] - 06/25/2017 10:42 EDT         OT ST Goals Reviewed :   Yes   AMMONS, OT, STEPHEN - 06/25/2017 10:42 EDT   Plan   OT Evaluation Date :   06/25/2017 EDT   OT Frequency Acute :   Mo/Tu/We/Th/Fr   Duration :   30    Duration Unit :   Days   Estimated Hours Per Day :    Other: 15 mins per day   Planned Treatments :   Balance training, Basic Activities of Daily Living, Mobility training, Pain management, Patient education, Safety education, Therapeutic activities, Therapeutic exercises, Therapeutic exercises for strengthening and ROM   Treatment Plan/Goals Established With Patient/Caregiver :   Yes   OT Evaluation Complete :   Yes   AMMONS, OT, STEPHEN - 06/25/2017 10:42 EDT   Time Spent With Patient   OT Evaluation Units, Low Complexity :   1 Unit   OT Time In :   10:30 EST   OT Time Out :   10:40 EST   OT Individual Eval Time, Low Complexity :   10 minutes   OT Total Individual Therapy Time :   10 minutes   OT Total Untimed Code Treatment Minutes :   10 minutes   OT Total Treatment Time :   10 minutes   AMMONS, OT, STEPHEN - 06/25/2017 10:42 EDT   Assessment   OT Impairments or Limitations :   Balance deficits, Basic activity of daily living deficits, Cognitive deficits, Endurance deficits, Mobility deficits, Pain, Strength deficits   Barriers to Safe Discharge OT :   Severity of deficits   OT Discharge Recommendations :   Pt may benefit from rehab upon d/c.     OT Treatment Recommendations :   Pt limited by feeling of dizziness and unsteadiness on her feet with sit>stand transition.  Pt also c/o chest discomfort that feels like someone is squeezing me.  RN Judith) notified.     AMMONS, OT, STEPHEN - 06/25/2017 10:42 EDT

## 2017-06-25 NOTE — Progress Notes (Signed)
 Inpatient PT Examination - Text       Inpatient PT Examination Entered On:  06/25/2017 12:03 EDT    Performed On:  06/25/2017 10:15 EDT by HAMBLIN, PT, PAULA               Reason for Treatment   Subjective Statement :   Pt reports feeling dizziness upon standing and fatigued after ambulation.     *Reason for Referral :   This is an 88yoF adm with dx: SOB,syncope. CT - for acute abnormality    precautions: fall risk, dizziness with change in position  PMH: HTN ,hx noncompliance     HAMBLIN, PT, PAULA - 06/25/2017 11:55 EDT   General Info   Physical Therapy Orders :   Physical Therapy Evaluation and Treatment Acute - 06/25/17 1:11:00 EDT, Stop date 06/25/17 1:11:00 EDT     Precautions RTF :    Communication to Nursing, 06/25/17 0:11:00 EDT, RD may manage/modify diet order and/or enteral nutrition per approved MNT protocol, 06/25/17 0:11:00 EDT, 06/25/17 0:11:00 EDT, Ordered   Notify Rapid Response Team, 06/25/17 0:11:00 EDT, For concerns regarding patient condition & notify MD, 06/25/17 0:11:00 EDT, 06/25/17 0:11:00 EDT, Ordered   Change attending to, 06/24/17 23:35:00 EDT, GOYAL-MD,  ABHINAV, Ordered     Orientation Assessment :   Oriented x 4   Affect/Behavior :   Appropriate, Cooperative   Basic Command Following :   Intact   Safety/Judgment :   Impaired   Pain Present :   Yes actual or suspected pain   HAMBLIN, PT, PAULA - 06/25/2017 11:55 EDT   Problem List   (As Of: 06/25/2017 12:03:46 EDT)   Problems(Active)    At risk for falls (SNOMED CT  :791316981 )  Name of Problem:   At risk for falls ; Onset Date:   06/25/2017 ; Recorder:   SYSTEM,  SYSTEM; Confirmation:   Confirmed ; Classification:   Interdisciplinary ; Code:   [id number] ; Last Updated:   06/25/2017 2:38 EDT ; Life Cycle Date:   06/25/2017 ; Life Cycle Status:   Active ; Vocabulary:   SNOMED CT   ; Comments:        06/25/2017 2:38 - SYSTEM,  SYSTEM  Problem added by Discern Expert      HLD (hyperlipidemia) (SNOMED CT  :07173982 )  Name of Problem:   HLD  (hyperlipidemia) ; Recorder:   BELLEW, RN, KRISTY M; Confirmation:   Confirmed ; Classification:   Patient Stated ; Code:   07173982 ; Contributor System:   PowerChart ; Last Updated:   06/24/2017 16:16 EDT ; Life Cycle Date:   06/24/2017 ; Life Cycle Status:   Active ; Vocabulary:   SNOMED CT        HTN (hypertension) (SNOMED CT  :[phone] )  Name of Problem:   HTN (hypertension) ; Recorder:   GLADIS, RN, PENNE; Confirmation:   Confirmed ; Classification:   Medical ; Code:   [phone] ; Contributor System:   PowerChart ; Last Updated:   10/28/2015 8:22 EDT ; Life Cycle Date:   10/28/2015 ; Life Cycle Status:   Active ; Vocabulary:   SNOMED CT          Diagnoses(Active)    Dizziness  Date:   06/24/2017 ; Diagnosis Type:   Reason For Visit ; Confirmation:   Complaint of ; Clinical Dx:   Dizziness ; Classification:   Medical ; Clinical Service:   Emergency medicine ; Code:   PNED ; Probability:  0 ; Diagnosis Code:   [id number]      Dizziness  Date:   06/24/2017 ; Diagnosis Type:   Discharge ; Confirmation:   Confirmed ; Clinical Dx:   Dizziness ; Classification:   Medical ; Clinical Service:   Non-Specified ; Code:   ICD-10-CM ; Probability:   0 ; Diagnosis Code:   R42      Epigastric pain  Date:   06/24/2017 ; Diagnosis Type:   Discharge ; Confirmation:   Confirmed ; Clinical Dx:   Epigastric pain ; Classification:   Medical ; Clinical Service:   Non-Specified ; Code:   ICD-10-CM ; Probability:   0 ; Diagnosis Code:   R10.13      Unsteadiness on feet  Date:   06/25/2017 ; Diagnosis Type:   Other ; Confirmation:   Differential ; Clinical Dx:   Unsteadiness on feet ; Classification:   Interdisciplinary ; Clinical Service:   Non-Specified ; Code:   ICD-10-CM ; Probability:   0 ; Diagnosis Code:   R26.81        Pain Assessment   Pain Location :   Other: from back  (betw scapula)around to sternum   Quality :   Fredericka SETTLE, PT, PAULA -  06/25/2017 11:55 EDT   Home Environment   Living Environment :   Home Environment  Devices/Equipment at Home:  None  Performed By:  Keller Delon HERO 06/25/2017  Lives With:  Alone  Performed By:  Keller Delon HERO 06/25/2017  Living Situation:  Home independently  Performed By:  Keller Delon HERO 06/25/2017  Sensory Deficits:  None  Performed By:  Keller Delon HERO 06/25/2017     Living Situation :   Home independently   Lives With :   Alone   Lives In :   Single level home   Calcium, PT, PAULA - 06/25/2017 11:55 EDT   Home Environment II   Living Environment :   No Living Environment Information Available     Devices/Equipment :   None   HAMBLIN, PT, PAULA - 06/25/2017 11:55 EDT   Prior Functional Status   ADL :   Independent   Mobility :   Independent   HAMBLIN, PT, PAULA - 06/25/2017 11:55 EDT   LE Range/Strength   LE Overall Range of Motion Grid   Left Lower Extremity Passive Range :   Within functional limits   Left Lower Extremity Active Range :   Within functional limits   Right Lower Extremity Passive Range :   Within functional limits   Right Lower Extremity Active Range :   Within functional limits   HAMBLIN, PT, PAULA - 06/25/2017 11:55 EDT   Lt Lower Extremity Strength :   Within functional limits   Rt Lower Extremity Strength :   Within functional limits   HAMBLIN, PT, PAULA - 06/25/2017 11:55 EDT   Sensation   Left Lower Extremity Sensation   Light Touch :   Intact   HAMBLIN, PT, PAULA - 06/25/2017 11:55 EDT   Right Lower Extremity Sensation   Light Touch :   Intact   HAMBLIN, PT, PAULA - 06/25/2017 11:55 EDT   Mobility   Mobility Grid   Sit to Supine :   Rehab Contact guard assistance   Transfer Sit to Stand :   Rehab Contact guard assistance   Transfer Stand to Sit :   Rehab Contact guard assistance   HAMBLIN, PT, PAULA - 06/25/2017 11:55 EDT  Ambulation Quality :   cga<>minA+2 for safety x 60' using rw and encouraged pt to pick @ a point @ eye level  to help with dizziness   Ambulation Distance  :   60 ft   Device :   Gait belt, Rolling walker   HAMBLIN, PT, PAULA - 06/25/2017 11:55 EDT   Balance   Balance Tests Performed :   Other: fair<>fair+ sitting balance - tends to have rounded shld posture and leans posteriorly  while sitting EOB and fair standing balance with B UE support   HAMBLIN, PT, PAULA - 06/25/2017 11:55 EDT   Assessment   PT Treatment Recommendations :   Pt presents with decreased balance(dizziness with change in position) and unsteady gait limited by dizziness. Pt reports chest discomfort and describes a sensation like something is squeezing her.Pt will benefit from skilled PT intervention for progressive amb, balance to help pt rtn to PLOF     HAMBLIN, PT, PAULA - 06/25/2017 12:04 EDT   PT Impairments or Limitations :   Ambulation deficits, Balance deficits, Endurance deficits, Safety awareness deficits, Other: dizziness with change in position   HAMBLIN, PT, PAULA - 06/25/2017 11:55 EDT   Discharge Recommendations :    may benefit from rehab @ d/c     HAMBLIN, PT, PAULA - 06/25/2017 12:04 EDT   Education   Responsible Learner Present for Session :   Yes   Teaching Method :   Explanation   HAMBLIN, PT, PAULA - 06/25/2017 12:04 EDT   Physical Therapy Education Grid   Ambulation with Nurse, adult :   Needs practice/supervision   Physical Therapy Plan of Care :   Needs practice/supervision   HAMBLIN, PT, PAULA - 06/25/2017 12:04 EDT   Long Term Goals   Outpatient PT Long Term Goals Rehab     Long Term Goal 1  Long Term Goal 2  Long Term Goal 3  Long Term Goal 4    Goal :    (I) with bed mobility sup<>sit EOB   sit<>stand +mod(I)   amb with LRAD +1 sba x 200' with good balance   sit<>stand x 5 reps +1 UE support     Status :    Initial              Comments :    to be met in 6 days                HAMBLIN, PT, PAULA - 06/25/2017 12:04 EDT  HAMBLIN, PT, PAULA - 06/25/2017 12:04 EDT  HAMBLIN, PT, PAULA - 06/25/2017 12:04 EDT  HAMBLIN, PT, PAULA - 06/25/2017 12:04 EDT      PT LT Goals Reviewed :   Yes    HAMBLIN, PT, PAULA - 06/25/2017 12:04 EDT   Short Term Goals   Other PT Goals Grid     Goal #1  Goal #2  Goal #3  Goal #4    Goal :    +1 sba with sup<>sit EOB   sit<>stand +1 cga using rw   amb x 100-150' with rw +1 cga and fair+<>good- balance   sit<>stand x5 reps B UE support +1 cga     Status :    Initial              Comments :    to be met in 3 days                HAMBLIN, PT, PAULA - 06/25/2017 12:04 EDT  HAMBLIN,  PT, PAULA - 06/25/2017 12:04 EDT  HAMBLIN, PT, PAULA - 06/25/2017 12:04 EDT  HAMBLIN, PT, PAULA - 06/25/2017 12:04 EDT      PT ST Goals Reviewed :   Yes   HAMBLIN, PT, PAULA - 06/25/2017 12:04 EDT   Plan   PT Frequency Acute :   Daily   Duration :   30    PT Duration Unit Rehab :   Days   Treatments Planned :   Balance training, Equipment training, Functional training, Gait training, Pain management, Patient education, Therapeutic activities, Therapeutic exercises   Treatment Plan/Goals Established With Patient/Caregiver :   Yes   Other PT Treatment Provided :   follow daily x 5-7days/wk   Evaluation Complete :   Yes   HAMBLIN, PT, PAULA - 06/25/2017 12:04 EDT   Time Spent With Patient   PT Evaluation Units, Low Complexity :   1 Unit   PT Individual Eval Time, Low Complexity :   15 minutes   PT Therapeutic Activity Units :   1 units   PT Therapeutic Activity Time :   10 minutes   PT Total Individual Min :   15    PT Treatment Time Comment :   PT eval, bed mobility, sit<>stand and amb with rw; ther ex sitting EOB and then rtn to supine  +1 cga. bed alarm on and nsg in with pt   PT Total Timed Code Treatment Units :   1 units   PT Total Timed Code Min :   10    PT Total Untimed Min Ac/Outp :   15    PT Total Treatment Time Ac/Outp :   25    HAMBLIN, PT, PAULA - 06/25/2017 12:04 EDT

## 2017-06-25 NOTE — Case Communication (Signed)
CM Discharge Planning Assessment - Text       CM Discharge Planning Assessment Entered On:  06/25/2017 10:22 EDT    Performed On:  06/25/2017 10:19 EDT by Vida Rigger               Home Environment   Living Environment :   No Living Environment Information Available     Affect/Behavior :   Appropriate, Calm, Cooperative   Lives With :   Alone   Living Situation :   Home independently   Outside Facility Information :   PCP: Dr. Silvana Newness  Pharmacy: [facility]   Home Health Provider :   none   Barriers at Home :   None   Vida Rigger - 06/25/2017 10:19 EDT   Home Environment   Home Equipment Rehab :   None   ADLs Prior to Admission :   Independent   Sensory Deficits :   None   Vida Rigger - 06/25/2017 10:19 EDT   Discharge Needs I   Previously Documented Discharge Needs :   DISCHARGE PLAN/NEEDS:  EQUIPMENT/TREATMENT NEEDS:       Previously Documented Benefits Information :   No discharge data available.     Anticipated Discharge Date :   06/25/2017 EDT   Anticipated Discharge Time Slot :   1400-1600   Discharge To :   Home independently   Home Caregiver Name/Relationship CM :   Neighbor: Jesusita Oka [phone]   Vida Rigger - 06/25/2017 10:19 EDT   CM Progress Note :   03/14: Patient admitted for dizziness and epigastric pain.  Patient is a/o and lives at home alone.  Patient is independent with ADLs.  Patient has no DME or home health. Patient is in OBS status.  MOON letter discussed and signed by patient. PT/OT to eval.  Awaiting recommedations.  CM to follow. (JLD)     Vida Rigger - 06/25/2017 10:22 EDT     Discharge Needs II   DischargDischarge Device/Equipment CMe Device/Equipment CM :   None   Professional Skilled Services :   No Needs   Needs Assistance with Transportation :   No   Discharge Transportation Arranged :   N/A   Needs Assistance at Home Upon Discharge :   No   Requires Caregiver Involvement :   No   Discharge Planning Time Spent :   10 minutes   Vida Rigger - 06/25/2017 10:19 EDT   Benefits   Admission Medicare Message Provided :   06/25/2017 9:35 EDT   Insurance Information :   Medicare, Private Insurance   Vida Rigger - 06/25/2017 10:19 EDT   Advance Directive   *Advance Directive :   No   Patient Wishes to Receive Further Information on Advance Directives :   No   Vida Rigger - 06/25/2017 10:19 EDT   Discharge Planning   Discharge Arrangements :       Patient Post-Acute Information    Patient Name: Allison [name], Allison [name]  MRN: [id number]  FIN: [id number]  Gender: Female  DOB: May 20, 2028  Age:  84 Years        *** No Post-Acute Placement(s) Listed ***          *** No Post-Acute Service(s) Listed ***       Barriers to Discharge Identified :   None identified   Vida Rigger - 06/25/2017 10:19 EDT  Discharge Planning Assessment Status   Discharge Planning Assessment Complete :   Yes   Vida RiggerDewolfe,  Jennifer M - 06/25/2017 10:19 EDT

## 2017-06-25 NOTE — Progress Notes (Signed)
Outpatient PT Certification Letter-Text       PT Outpatient Certification Letter Entered On:  06/25/2017 12:13 EDT    Performed On:  06/25/2017 12:12 EDT by Tresa Res, PT, PAULA               Physician Certification   Date of Injury or Surgery :   06/24/2017 EDT   Ordering Physician Name :   Tyron Russell   Number of Visits This Interval :   1   Dear Physician :   Thank you for your referral. Below is the patient information for the stated interval of treatment. Please review, modify (if necessary), sign, and return. Thank you.   Date of Evaluation :   1/32/4401 EDT   PT Certification Interval End :   06/25/2017 EDT   Physician Signature Required :   Yes   Staff Physician Signature :   The physician's electronic signature noted above indicates approval of the documented Plan of Care for the stated interval.   HAMBLIN, PT, PAULA - 06/25/2017 12:12 EDT   Problem List   (As Of: 06/25/2017 12:13:45 EDT)   Problems(Active)    At risk for falls (SNOMED CT  :027253664 )  Name of Problem:   At risk for falls ; Onset Date:   06/25/2017 ; Recorder:   SYSTEM,  SYSTEM; Confirmation:   Confirmed ; Classification:   Interdisciplinary ; Code:   [id number] ; Last Updated:   06/25/2017 2:38 EDT ; Life Cycle Date:   06/25/2017 ; Life Cycle Status:   Active ; Vocabulary:   SNOMED CT   ; Comments:        06/25/2017 2:38 - SYSTEM,  SYSTEM  Problem added by Discern Expert      HLD (hyperlipidemia) (SNOMED CT  :56387564 )  Name of Problem:   HLD (hyperlipidemia) ; Recorder:   BELLEW, RN, KRISTY M; Confirmation:   Confirmed ; Classification:   Patient Stated ; Code:   [id number] ; Contributor System:   PowerChart ; Last Updated:   06/24/2017 16:16 EDT ; Life Cycle Date:   06/24/2017 ; Life Cycle Status:   Active ; Vocabulary:   SNOMED CT        HTN (hypertension) (SNOMED CT  [phone] )  Name of Problem:   HTN (hypertension) ; Recorder:   Hassell Done, RN, Erlene Quan; Confirmation:   Confirmed ; Classification:   Medical ; Code:    [phone] ; Contributor System:   PowerChart ; Last Updated:   10/28/2015 8:22 EDT ; Life Cycle Date:   10/28/2015 ; Life Cycle Status:   Active ; Vocabulary:   SNOMED CT          Diagnoses(Active)    Dizziness  Date:   06/24/2017 ; Diagnosis Type:   Reason For Visit ; Confirmation:   Complaint of ; Clinical Dx:   Dizziness ; Classification:   Medical ; Clinical Service:   Emergency medicine ; Code:   PNED ; Probability:   0 ; Diagnosis Code:   [id number]      Dizziness  Date:   06/24/2017 ; Diagnosis Type:   Discharge ; Confirmation:   Confirmed ; Clinical Dx:   Dizziness ; Classification:   Medical ; Clinical Service:   Non-Specified ; Code:   ICD-10-CM ; Probability:   0 ; Diagnosis Code:   R42      Epigastric pain  Date:   06/24/2017 ; Diagnosis Type:   Discharge ; Confirmation:   Confirmed ; Clinical Dx:  Epigastric pain ; Classification:   Medical ; Clinical Service:   Non-Specified ; Code:   ICD-10-CM ; Probability:   0 ; Diagnosis Code:   R10.13      Unsteadiness on feet  Date:   06/25/2017 ; Diagnosis Type:   Other ; Confirmation:   Differential ; Clinical Dx:   Unsteadiness on feet ; Classification:   Interdisciplinary ; Clinical Service:   Non-Specified ; Code:   ICD-10-CM ; Probability:   0 ; Diagnosis Code:   R26.81        Plan   PT Frequency Rehab :   Daily   PT Duration Rehab :   30   PT Anticipated Treatments, Needs :   Balance training, Equipment training, Functional training, Gait training, Pain management, Patient education, Therapeutic activities, Therapeutic exercises   PT Certification Letter Complete :   Yes   HAMBLIN, PT, [place] - 06/25/2017 12:12 EDT   Outpatient Review   Prior Functional Level Grid   ADL :   Independent   Mobility :   Independent   HAMBLIN, PT, PAULA - 06/25/2017 12:12 EDT   PT Impairments or Limitations :   Ambulation deficits, Balance deficits, Endurance deficits, Safety awareness deficits, Other: dizziness with change in  position   *Clinical Assessment Summary :   pt presents with good strength but demostrated unsteady gait and decreased balance 2* dizziness. Pt will benefit from skilled PT for balance,safety awareness andprogressive amb. pt may benefit from rehab @ d/c     HAMBLIN, PT, PAULA - 06/25/2017 12:12 EDT   Short Term Goals   Other PT Goals Grid     Goal #1  Goal #2  Goal #3  Goal #4    Goal :    +1 sba with sup<>sit EOB   sit<>stand +1 cga using rw   amb x 100-150' with rw +1 cga and fair+<>good- balance   sit<>stand x5 reps B UE support +1 cga     Status :    Initial              Comments :    to be met in 3 days                HAMBLIN, PT, PAULA - 06/25/2017 12:12 EDT  HAMBLIN, PT, PAULA - 06/25/2017 12:12 EDT  HAMBLIN, PT, PAULA - 06/25/2017 12:12 EDT  HAMBLIN, PT, PAULA - 06/25/2017 12:12 EDT      PT ST Goals Reviewed :   Yes   HAMBLIN, PT, PAULA - 06/25/2017 12:12 EDT    Signature Line                                                 Electronically Signed On 06/25/17 12:12 PM                                               _________________________________________________                                               HAMBLIN, PT, PAULA  Electronically Signed On 07/03/17 07:52 PM                                               _________________________________________________                                                Tyron Russell                  Dicatation Date: 06/25/17 12:12 PM

## 2017-06-25 NOTE — Progress Notes (Signed)
Chaplaincy Note - Text       Chaplaincy Note Entered On:  06/25/2017 9:37 EDT    Performed On:  06/25/2017 9:34 EDT by Renee HarderBennett,  Kevin               Chaplaincy Consult   Faith/Denomination :   Protestant   Importance of Faith :   Moderately important   Religious Spiritual Resources :   [facility]   Additional Information, Chaplaincy :   Pt was postive about themselves. There were no issues that she raised and she was hopeful about recovery.   Renee HarderBennett,  Kevin - 06/25/2017 9:34 EDT

## 2017-06-26 NOTE — Progress Notes (Signed)
Anticoagulation Monitoring - Text       Pharmacy Anticoagulation Monitoring Entered On:  06/26/2017 12:08 EDT    Performed On:  06/26/2017 12:07 EDT by Ernestine ConradShah,  Setu               Anticoagulation Monitoring Chart   Indication for Treatment :   VTE prophylaxis   Ernestine ConradShah,  Setu - 06/26/2017 12:07 EDT [Not Validated]    Date :    06/26/2017 EDT              Medication(s) :    Enoxaparin SQ              Current Dose :    40mg  q24h              Hgb/Hct :    12.3              CrCl :    40               Notes :    Plt 141, 84yo F, Wt 68kg              Initials :    SS                Ernestine ConradShah,  Setu - 06/26/2017 12:07 EDT

## 2017-06-26 NOTE — Progress Notes (Signed)
Inpatient PT Daily Documentation - Text       Inpatient PT Daily Documentation Entered On:  06/26/2017 12:53 EDT    Performed On:  06/26/2017 12:44 EDT by WARD, PTA, CHERYL H               Reason for Treatment   *Reason for Referral :   pta #1 pt sup and agreed to PT    This is an 88yoF adm with dx: SOB,syncope. CT - for acute abnormality    precautions: fall risk, dizziness with change in position  PMH: HTN ,hx noncompliance     WARD, PTA, CHERYL H - 06/26/2017 12:44 EDT   Review/Treatments Provided   PT Goals :   PT Short Term Goals    06/25/2017  Other PT Goal #1: +1 sba with sup<>sit EOB; Initial; to be met in 3 days  Other PT Goal #2: sit<>stand +1 cga using rw  Other PT Goal #3: amb x 100-150' with rw +1 cga and fair+<>good- balance  Other PT Goal #4: sit<>stand x5 reps B UE support +1 cga     PT Plan :   Treatment Frequency:  Daily (modified)   Performed ByTresa Res, PT, PAULA  06/25/2017 12:12  Treatment Duration: 30 Performed By: Tresa Res, PT, PAULA  06/25/2017 12:12  Planned Treatments: Balance training, Equipment training, Functional training, Gait training, Pain management, Patient education, Therapeutic activities, Therapeutic exercises Performed ByTresa Res, PT, [place]  06/25/2017 12:12     Physical Therapy Orders :   Physical Therapy Additional Treatment Acute - 06/25/17 12:12:05 EDT, Balance training, Equipment training, Functional training, Gait training, Pain management, Patient education, Therapeutic activities, Therapeutic exercises, for 30 Days, Stop date 07/26/17 6:59:00 EDT, Daily     Pain Present :   No actual or suspected pain   PT Therapeutic Activity,Mobility,Balance :   Yes   WARD, PTA, CHERYL H - 06/26/2017 12:44 EDT   Therapeutic Activities/Mobility/Balance   Reassess Mobility :   Yes   WARD, PTA, CHERYL H - 06/26/2017 12:44 EDT   Mobility   Mobility Grid   Supine to Sit :   Rehab Contact guard assistance   Scooting :   Rehab Contact guard assistance   Transfer Sit to Stand :   Rehab Contact  guard assistance   Transfer Stand to Sit :   Rehab Contact guard assistance   WARD, PTA, CHERYL H - 06/26/2017 12:44 EDT   Ambulation Level :   Minimal contact assistance   Ambulation Quality :   100' c RW; 120' HHA   Ambulation Distance :   220 ft   Device :   Rolling walker   WARD, PTA, CHERYL H - 06/26/2017 12:44 EDT   Assessment   PT Impairments or Limitations :   Ambulation deficits, Balance deficits, Endurance deficits, Safety awareness deficits, Other: dizziness with change in position   Discharge Recommendations :    may benefit from rehab @ d/c     PT Treatment Recommendations :   Pt improving but did c/o of slight dizziness at onset of session. Able to inc amb distance and also able to amb c steady, slow pace c RW and HHA. Pt lives alone and is agreeable to rehab stay to cont improving overall fxl mob and ind.      WARD, PTA, CHERYL H - 06/26/2017 12:44 EDT   Time Spent With Patient   PT Time In :   10:11 EST   PT Time Out :   10:36  EST   PT Therapeutic Activity Units :   2 units   PT Therapeutic Activity Time :   25 minutes   PT Treatment Time Comment :   sup-sit; sat EOB; sit-stand; amb c RW and HHA; returned to chair; discussed d/c plans to rehab and answered pt questions; d/c planner arrived   PT Total Timed Code Treatment Units :   2 units   PT Total Timed Code Min :   25    PT Total Treatment Time Ac/Outp :   25    WARD, PTA, CHERYL H - 06/26/2017 12:44 EDT

## 2017-06-26 NOTE — Case Communication (Signed)
CM Discharge Planning Assessment - Text       CM Discharge Planning Assessment Entered On:  06/26/2017 11:16 EDT    Performed On:  06/26/2017 11:13 EDT by PATTERSON,  [place] Environment   Living Environment :   Living Situation: Home independently  Current Home Treatments:   Home Devices/Equipment   Professional Skilled Services:   Education administrator and Community Resources:   Sensory Deficits: None  Performed by: Ainsley Spinner M-06/25/17 10:19:00    Living Situation: Home independently  Current Home Treatments:   Home Devices/Equipment   Professional Skilled Services:   Education administrator and Community Resources:   Sensory Deficits:   Performed byTresa Res, PT, PAULA-06/25/17 10:15:00       Affect/Behavior :   Appropriate, Calm, Cooperative   Lives With :   Alone   Lives In :   Single level home   Living Situation :   Home independently   Outside Facility Information :   PCP: Dr. Almyra Deforest  Pharmacy: [place] Provider :   none   Barriers at Home :   None   PATTERSON,  [name] - 06/26/2017 11:13 EDT   Home Environment   Home Equipment Rehab :   None   ADLs Prior to Admission :   Independent   Sensory Deficits :   None   Cloria Spring R - 06/26/2017 11:13 EDT   Discharge Needs I   Previously Documented Discharge Needs :   DISCHARGE PLAN/NEEDS:  Anticipated Discharge Date: 06/25/2017 - Maryfrances Bunnell - 06/25/17 10:19:00  Discharge To, Anticipated: Home independently - Maryfrances Bunnell - 06/25/17 10:19:00  Needs Assistance with Transportation: No - Maryfrances Bunnell - 06/25/17 10:19:00  EQUIPMENT/TREATMENT NEEDS:  Needs Assistance at Home Upon Discharge: No - Maryfrances Bunnell - 06/25/17 10:19:00  Professional Skilled Services: No Needs - Maryfrances Bunnell - 06/25/17 10:19:00       Previously Documented Benefits Information :   Performed By: Maryfrances Bunnell  - 06/25/17 10:19:00       Anticipated Discharge Date :   06/28/2017 EDT   Anticipated Discharge Time  Slot :   1400-1600   Discharge To :   [place] Caregiver Name/Relationship CM :   NeighborCarolin Coy [phone]   CM Progress Note :   03/14: Patient admitted for dizziness and epigastric pain.  Patient is a/o and lives at home alone.  Patient is independent with ADLs.  Patient has no DME or home health. Patient is in OBS status.  MOON letter discussed and signed by patient. PT/OT to eval.  Awaiting recommedations.  CM to follow. (JLD)    3/15 RRP I met with pt to followup.  PT rec rehab.  Gave her SNF list.  She chose [facility], referral sent.  PPD ordered today.  3rd night stay will be sat night, possible dc Sunday.  MD notified.  She was changed to inpt yesterday, IM letter reviewed and signed today.  Her granddaughter plans to move in with her to help.  Her daughter is coming into town Sunday.  Packet started and transport forms filled out.     Cloria Spring R - 06/26/2017 11:13 EDT   Discharge Needs II   DischargDischarge Device/Equipment CMe Device/Equipment CM :   None  Professional Skilled Services :   Nursing, Physical Therapy   Needs Assistance with Transportation :   No   Discharge Transportation Arranged :   N/A   Needs Assistance at Home Upon Discharge :   Yes   Requires Caregiver Involvement :   No   Discharge Planning Time Spent :   30 minutes   [name] - 06/26/2017 11:13 EDT   Benefits   Admission Medicare Message Provided :   06/25/2017 9:35 EDT   Insurance Information :   Medicare, [place],  [place] - 06/26/2017 11:13 EDT   Advance Directive   *Advance Directive :   No   Patient Wishes to Receive Further Information on Advance Directives :   No   [name] - 06/26/2017 11:13 EDT   Discharge Planning   Discharge Arrangements :       Patient Post-Acute Information    Patient Name: VERMELL, MADRID  MRN: [id number]  FIN: [id number]  Gender: Female  DOB: Sep 08, 2028  Age:  84 Years        *** No Post-Acute Placement(s) Listed  ***          *** No Post-Acute Service(s) Listed ***       [place] [phone]  [facility] [phone]     Discharge Options Discussed with Patient :   Nursing home   Interventions Performed :   All resolved   Discharge Plan Discussion :   Discussed with patient, Patient agrees with plan   Discharge Medicare Message Date/Time :   06/26/2017 11:16 EDT   Choice Lists Provided :   Skilled nursing facility   Barriers to Discharge Identified :   None identified   [name] - 06/26/2017 11:13 EDT   Discharge Planning Assessment Status   Discharge Planning Assessment Complete :   Yes   PATTERSON,  [name] - 06/26/2017 11:13 EDT

## 2017-06-26 NOTE — Progress Notes (Signed)
Chaplaincy Note - Text       Chaplaincy Note Entered On:  06/26/2017 15:46 EDT    Performed On:  06/26/2017 15:43 EDT by Elmer SowILEY,  ROSALIND R               Chaplaincy Consult   Faith/Denomination :   Non-denominational   Worshipping Community :   [facility] [employer]  [facility] [place]   Importance of Faith :   Very important   Religious Spiritual Resources :   Divine presence, Hope, Spiritual well being   Religious Spiritual Practices :   Prayer, Scripture, Worship services   Religious Family Issues :   Church member present   Additional Information, Chaplaincy :   Pt has a strong faith foundation.  She is very upbeat and a little talker.  I establsihed pastoral presence and support, offered prayer, and left contact card.   Elmer SowRILEY,  ROSALIND R - 06/26/2017 15:43 EDT

## 2017-06-26 NOTE — Nursing Note (Signed)
Medication Administration Follow Up-Text       Medication Administration Follow Up Entered On:  06/26/2017 22:26 EDT    Performed On:  06/26/2017 22:26 EDT by Cornelia CopaEvangelista, RN, [place]      Intervention Information:     hydrALAZINE  Performed by Cornelia CopaEvangelista, RN, [place] on 06/26/2017 21:30:00 EDT       hydrALAZINE,10mg   IV Push,Forearm, Mid Right,hypertension       Med Response   ED Medication Response :   No adverse reaction, Symptoms improved   Cornelia CopaEvangelista, RN, [place] - 06/26/2017 22:26 EDT

## 2017-06-26 NOTE — Case Communication (Signed)
CM Discharge Planning Assessment - Text       CM Discharge Planning Ongoing Assessment Entered On:  06/26/2017 15:02 EDT    Performed On:  06/26/2017 15:01 EDT by Allison [name]               Discharge Needs I   Previously Documented Discharge Needs :   DISCHARGE PLAN/NEEDS:  Anticipated Discharge Date: 06/28/2017 - Allison [name] - 06/26/17 11:13:00  Discharge To, Anticipated: Allison [name] - 06/26/17 11:13:00  Needs Assistance with Transportation: No - Allison [name] - 06/26/17 11:13:00  EQUIPMENT/TREATMENT NEEDS:  Needs Assistance at Home Upon Discharge: Yes Allison [name] - 06/26/17 11:13:00  Professional Skilled Services: Nursing, Physical Therapy - Allison [name] - 06/26/17 11:13:00       Previously Documented Benefits Information :   Performed By: Allison [name]  - 06/26/17 11:13:00    Performed By: Allison [name]  - 06/25/17 10:19:00       Anticipated Discharge Date :   06/28/2017 EDT   Anticipated Discharge Time Slot :   1400-1600   Discharge To :   [place] Caregiver Name/Relationship CM :   Neighbor: Allison [name] [phone]  Allison [name] (dtr) [phone]   Allison [name] - 06/26/2017 15:01 EDT   CM Progress Note :   03/14: Patient admitted for dizziness and epigastric pain.  Patient is a/o and lives at home alone.  Patient is independent with ADLs.  Patient has no DME or home health. Patient is in OBS status.  MOON letter discussed and signed by patient. PT/OT to eval.  Awaiting recommedations.  CM to follow. (JLD)    3/15 RRP I met with pt to followup.  PT rec rehab.  Gave her SNF list.  She chose [facility], referral sent.  PPD ordered today.  3rd night stay will be sat night, possible dc Sunday.  MD notified.  She was changed to inpt yesterday, IM letter reviewed and signed today.  Her granddaughter plans to move in with her to help.  Her daughter is coming into town Sunday.   Packet started and transport forms filled out.  Spoke with pt's dtr Allison [name], she will eb here Sunday to help transition from hospital to SNF.     Allison [name] - 06/26/2017 15:07 EDT     Discharge Needs II   DischargDischarge Device/Equipment CMe Device/Equipment CM :   None   Professional Skilled Services :   Nursing, Physical Therapy   Needs Assistance with Transportation :   No   Discharge Transportation Arranged :   N/A   Needs Assistance at Home Upon Discharge :   Yes   Requires Caregiver Involvement :   No   Discharge Planning Time Spent :   30 minutes   Allison [name] - 06/26/2017 15:01 EDT   Discharge Planning   Discharge Arrangements :       Patient Post-Acute Information    Patient Name: Allison [name], Allison [name]  MRN: [id number]  FIN: [id number]  Gender: Female  DOB: 09-Sep-2028  Age:  84 Years        *** No Post-Acute Placement(s) Listed ***          *** No Post-Acute Service(s) Listed [facility] Services :   [place] [phone])  [phone]  [facility] [phone]     Discharge Options Discussed with Patient :   Nursing home   Interventions Performed :   All resolved   Discharge Plan Discussion :   Discussed with patient, Patient agrees with plan   Discharge Medicare Message Date/Time :   06/26/2017 11:16 EDT   Choice Lists Provided :   Skilled nursing facility   Barriers to Discharge Identified :   None identified   Allison [name] - 06/26/2017 15:01 EDT   Discharge Planning Assessment Status   Discharge Planning Assessment Complete :   Yes   Allison [name] - 06/26/2017 15:01 EDT

## 2017-06-26 NOTE — Nursing Note (Signed)
Orthostatics - Text       Orthostatics Entered On:  06/26/2017 21:32 EDT    Performed On:  06/26/2017 21:30 EDT by Cornelia Copa, RN, [place]               Orthostatics   Systolic/Diastolic  Supine BP :   178 mmHg (HI)    Systolic/Diastolic  Supine BP :   74 mmHg   Pulse Supine :   50 bpm (LOW)    Systolic/Diastolic  Standing BP :   193 mmHg (>HHI)    Systolic/Diastolic  Standing BP :   86 mmHg   Pulse Standing :   60 bpm   Systolic/Diastolic  Sitting BP :   180 mmHg (HI)    Systolic/Diastolic  Sitting BP :   82 mmHg   Pulse Sitting :   56 bpm (LOW)    Comments :   denies any c/o dizziness nor light  headedness   Cornelia Copa, RN, [place] - 06/26/2017 21:30 EDT

## 2017-06-27 NOTE — Nursing Note (Signed)
PPD Reading - Text       PPD Reading Entered On:  06/27/2017 23:16 EDT    Performed On:  06/27/2017 23:04 EDT by Cornelia CopaEvangelista, RN, [place]               PPD Reading   PPD mm of Induration :   0 mm   PPD Interpretation :   Negative   Cornelia CopaEvangelista, RN, [place] - 06/27/2017 23:04 EDT

## 2017-06-28 NOTE — Discharge Summary (Signed)
 Inpatient Patient Summary       ;        [facility]  [address]  [place], [place] [address]  [phone]  Patient Discharge Instructions     Name: Allison [name], Allison [name]  Current Date: 06/28/2017 14:06:49  DOB: Sep 05, 1928 MRN: [id number] FIN: [id number]  Patient Address: [address] [place] [address]  Patient Phone: [phone]  Primary Care Provider:  Name: LORENZO MAGDALENA CROME  Phone: [phone]   Immunizations Provided:         Immunization(s) Given This Visit    Name Date   tuberculin purified protein derivative 06/26/17 15:43:00           Discharge Diagnosis: Dizziness; Epigastric pain  Discharged To: TO, ANTICIPATED%>Skilled Nursing Facility - Short Term  Home Treatments: TREATMENTS, ANTICIPATED%>  Devices/Equipment: EQUIPMENT REHAB%>None  Post Hospital Services: HOSPITAL SERVICES%>[facility] [phone]    [facility] [phone]      Professional Skilled Services: SKILLED SERVICES%>  Therapist, sports and Community Resources:               SERV AND COMM RES, ANTICIPATED%>  Mode of Discharge Transportation: TRANSPORTATION%>  Discharge Orders          Discharge Patient 06/28/17 9:41:00 EDT, Discharge to Acute Care Facility         Comment:      Medications   During the course of your visit, your medication list was updated with the most current information. The details of those changes are reflected below:          New Medications  Other Medications  acetaminophen  (acetaminophen  325 mg oral tablet) 2 Tabs Oral (given by mouth) every 4 hours as needed mild pain (1-3).  Last Dose:____________________  lisinopril  (lisinopril  10 mg oral tablet) 1 Tabs Oral (given by mouth) At Bedtime (Once a Day). Refills: 0.  Last Dose:____________________  lisinopril  (lisinopril  20 mg oral tablet) 1 Tabs Oral (given by mouth) every day.  Last Dose:____________________  meclizine  (meclizine  12.5 mg oral tablet) 2 Tabs Oral (given by mouth) 3 times a day as needed dizziness.  Last  Dose:____________________  omeprazole (omeprazole 20 mg oral delayed release tablet) 1 Tabs Oral (given by mouth) every day. Refills: 0.  Last Dose:____________________  Medications that have not changed  Other Medications  amLODIPine  (Norvasc  10 mg oral tablet) 1 Tabs Oral (given by mouth) every day.  Last Dose:____________________  aspirin  (aspirin  81 mg oral tablet) 1 Tabs Oral (given by mouth) every day.  Last Dose:____________________  atorvastatin (atorvastatin 40 mg oral tablet) 1 Tabs Oral (given by mouth) every day.  Last Dose:____________________  multivitamin with minerals (One-A-Day Women 50 Plus oral tablet) 1 Tabs Oral (given by mouth) every day.  Last Dose:____________________  No Longer Take the Following Medications  hydrALAZINE  (hydrALAZINE  50 mg oral tablet) 1 Tabs Oral (given by mouth) 3 times a day., SOUND ALIKE / LOOK ALIKE - VERIFY DRUG  Stop Taking Reason: Physician Request  hydrALAZINE  (hydrALAZINE  50 mg oral tablet)   Stop Taking Reason:   irbesartan (irbesartan 300 mg oral tablet) 1 Tabs Oral (given by mouth) every day.  Stop Taking Reason: Physician Request         [facility] would like to thank you for allowing us  to assist you with your healthcare needs. The following includes patient education materials and information regarding your injury/illness.     [name], [name] has been given the following list of follow-up  instructions, prescriptions, and patient education materials:  Follow-up Instructions:              With: Address: When:   [facility] [address], [address] [place], [place] [address]  [phone] Business (1) Within 1 to 2 weeks   Comments:   Follow up after discharge from rehab                    It is important to always keep an active list of medications available so that you can share with other providers and manage your medications appropriately. As an additional courtesy, we are also providing you with your final active medications list that you  can keep with you.            acetaminophen  (acetaminophen  325 mg oral tablet) 2 Tabs Oral (given by mouth) every 4 hours as needed mild pain (1-3).  amLODIPine  (Norvasc  10 mg oral tablet) 1 Tabs Oral (given by mouth) every day.  aspirin  (aspirin  81 mg oral tablet) 1 Tabs Oral (given by mouth) every day.  atorvastatin (atorvastatin 40 mg oral tablet) 1 Tabs Oral (given by mouth) every day.  lisinopril  (lisinopril  10 mg oral tablet) 1 Tabs Oral (given by mouth) At Bedtime (Once a Day). Refills: 0.  lisinopril  (lisinopril  20 mg oral tablet) 1 Tabs Oral (given by mouth) every day.  meclizine  (meclizine  12.5 mg oral tablet) 2 Tabs Oral (given by mouth) 3 times a day as needed dizziness.  multivitamin with minerals (One-A-Day Women 50 Plus oral tablet) 1 Tabs Oral (given by mouth) every day.  omeprazole (omeprazole 20 mg oral delayed release tablet) 1 Tabs Oral (given by mouth) every day. Refills: 0.      Take only the medications listed above. Contact your doctor prior to taking any medications not on this list.        Discharge instructions, if any, will display below     Instructions for Diet: INSTRUCTIONS FOR DIET%>   Instructions for Supplements: SUPPLEMENT INSTRUCTIONS%>   Instructions for Activity: INSTRUCTIONS FOR ACTIVITY%>   Instructions for Wound Care: INSTRUCTIONS FOR WOUND CARE%>     Medication leaflets, if any, will display below     Patient education materials, if any, will display below            Omeprazole; Sodium Bicarbonate oral capsule   What is this medicine?   OMEPRAZOLE; SODIUM BICARBONATE (oh ME pray zol; SOE dee um bye KAR Golden ate) prevents the production of acid in the stomach. It is used to treat gastroesophageal reflux disease (GERD), ulcers, and inflammation of the esophagus.   How should I use this medicine?   Take this medicine by mouth with a glass of water. Do not take with other drinks or foods. Follow the directions on the prescription label. Do not cut, crush or chew the capsules.  Take this medicine on an empty stomach, at least 1 hour before a meal. Take your medicine at regular intervals. Do not take your medicine more often than directed. Do not stop taking except on your doctor's advice.   Talk to your pediatrician regarding the use of this medicine in children. Special care may be needed.   What side effects may I notice from receiving this medicine?   Side effects that you should report to your doctor or health care professional as soon as possible:      allergic reactions like skin rash, itching or hives, swelling of the face, lips, or tongue  bone, muscle or joint pain      breathing problems      chest pain      dark urine      diarrhea      dizziness      fast, irregular heartbeat      feeling faint or lightheaded, falls      fever or sore throat      high blood pressure      palpitations      muscle spasms      redness, blistering, peeling or loosening of the skin, including inside the mouth      seizures      stomach pain      swelling of the ankles, legs      tremors      trouble passing urine or change in the amount of urine      unusual bleeding, bruising      unusually weak or tired      yellowing of the eyes or skin    Side effects that usually do not require medical attention (report to your doctor or health care professional if they continue or are bothersome):      constipation      dry mouth      headache      loose stools      nausea    What may interact with this medicine?   Do not take this medicine with any of the following medications:      atazanavir      clopidogrel      nelfinavir    This medicine may also interact with the following medications:      antifungals like itraconazole, ketoconazole, and voriconazole      antivirals like delavirdine, efavirenz, indinavir, saquinavir      certain medicines that treat or prevent blood clots like warfarin      cyclosporine      dasatinib      digoxin      disulfiram       diuretics      iron supplements      medicines for anxiety, panic, and sleep like diazepam      medicines for seizures like carbamazepine, phenobarbital, phenytoin      methotrexate      mycophenolate mofetil      other medicines for stomach acid      tacrolimus      tolmetin      vitamin B12    What if I miss a dose?   If you miss a dose, take it as soon as you can. If it is almost time for your next dose, take only that dose. Do not take double or extra doses.   Where should I keep my medicine?   Keep out of the reach of children.   Store at room temperature between 15 and 30 degrees C (59 and 86 degrees F). Protect from moisture. Throw away any unused medicine after the expiration date.   What should I tell my health care provider before I take this medicine?   They need to know if you have any of these conditions:      Bartter's syndrome      kidney disease      history of low levels of calcium, magnesium , or potassium in the blood      low salt diet      metabolic alkalosis      an unusual reaction to omeprazole,  sodium bicarbonate, other medicines, foods, dyes, or preservatives      pregnant or trying to get pregnant      breast-feeding    What should I watch for while using this medicine?   Visit your doctor for regular check ups. Tell your doctor if your symptoms do not get better or if they get worse.   Do not treat diarrhea with over the counter products. Contact your doctor if you have diarrhea that lasts more than 2 days or if it is severe and watery.   You may need blood work done while you are taking this medicine.          NOTE:This sheet is a summary. It may not cover all possible information. If you have questions about this medicine, talk to your doctor, pharmacist, or health care provider. Copyright 2017 [employer] Standard             Meclizine  tablets or capsules   What is this medicine?   MECLIZINE  (MEK li zeen) is an antihistamine. It is used to prevent nausea, vomiting, or  dizziness caused by motion sickness. It is also used to prevent and treat vertigo (extreme dizziness or a feeling that you or your surroundings are tilting or spinning around).   How should I use this medicine?   Take this medicine by mouth with a glass of water. Follow the directions on the prescription label. If you are using this medicine to prevent motion sickness, take the dose at least 1 hour before travel. If it upsets your stomach, take it with food or milk. Take your doses at regular intervals. Do not take your medicine more often than directed.   Talk to your pediatrician regarding the use of this medicine in children. Special care may be needed.   What side effects may I notice from receiving this medicine?   Side effects that you should report to your doctor or health care professional as soon as possible:      feeling faint or lightheaded, falls      fast, irregular heartbeat     Side effects that usually do not require medical attention (report these to your doctor or health care professional if they continue or are bothersome):      constipation      headache      trouble passing urine or change in the amount of urine      trouble sleeping      upset stomach    What may interact with this medicine?   Do not take this medicine with any of the following medications:      MAOIs like Carbex, Eldepryl, Marplan, Nardil, and Parnate     This medicine may also interact with the following medications:      alcohol      antihistamines for allergy, cough and cold      certain medicines for anxiety or sleep      certain medicines for depression, like amitriptyline, fluoxetine, sertraline      certain medicines for seizures like phenobarbital, primidone      general anesthetics like halothane, isoflurane, methoxyflurane, propofol       local anesthetics like lidocaine , pramoxine, tetracaine      medicines that relax muscles for surgery      narcotic medicines for pain      phenothiazines like  chlorpromazine, mesoridazine, prochlorperazine, thioridazine    What if I miss a dose?   If you miss a dose, take it as  soon as you can. If it is almost time for your next dose, take only that dose. Do not take double or extra doses.   Where should I keep my medicine?   Keep out of the reach of children.   Store at room temperature between 15 and 30 degrees C (59 and 86 degrees F). Keep container tightly closed. Throw away any unused medicine after the expiration date.   What should I tell my health care provider before I take this medicine?   They need to know if you have any of these conditions:      glaucoma      lung or breathing disease, like asthma      problems urinating      prostate disease      stomach or intestine problems      an unusual or allergic reaction to meclizine , other medicines, foods, dyes, or preservatives      pregnant or trying to get pregnant      breast-feeding    What should I watch for while using this medicine?   Tell your doctor or healthcare professional if your symptoms do not start to get better or if they get worse.   You may get drowsy or dizzy. Do not drive, use machinery, or do anything that needs mental alertness until you know how this medicine affects you. Do not stand or sit up quickly, especially if you are an older patient. This reduces the risk of dizzy or fainting spells. Alcohol may interfere with the effect of this medicine. Avoid alcoholic drinks.   Your mouth may get dry. Chewing sugarless gum or sucking hard candy, and drinking plenty of water may help. Contact your doctor if the problem does not go away or is severe.   This medicine may cause dry eyes and blurred vision. If you wear contact lenses you may feel some discomfort. Lubricating drops may help. See your eye doctor if the problem does not go away or is severe.          NOTE:This sheet is a summary. It may not cover all possible information. If you have questions about this medicine, talk to your  doctor, pharmacist, or health care provider. Copyright 2017 [employer] Standard             Lisinopril  tablets   What is this medicine?   LISINOPRIL  (lyse IN oh pril) is an ACE inhibitor. This medicine is used to treat high blood pressure and heart failure. It is also used to protect the heart immediately after a heart attack.   How should I use this medicine?   Take this medicine by mouth with a glass of water. Follow the directions on your prescription label. You may take this medicine with or without food. If it upsets your stomach, take it with food. Take your medicine at regular intervals. Do not take it more often than directed. Do not stop taking except on your doctor's advice.   Talk to your pediatrician regarding the use of this medicine in children. Special care may be needed. While this drug may be prescribed for children as young as 78 years of age for selected conditions, precautions do apply.   What side effects may I notice from receiving this medicine?   Side effects that you should report to your doctor or health care professional as soon as possible:      allergic reactions like skin rash, itching or hives, swelling of the hands, feet, face,  lips, throat, or tongue      breathing problems      signs and symptoms of kidney injury like trouble passing urine or change in the amount of urine      signs and symptoms of increased potassium like muscle weakness; chest pain; or fast, irregular heartbeat      signs and symptoms of liver injury like dark yellow or brown urine; general ill feeling or flu-like symptoms; light-colored stools; loss of appetite; nausea; right upper belly pain; unusually weak or tired; yellowing of the eyes or skin      signs and symptoms of low blood pressure like dizziness; feeling faint or lightheaded, falls; unusually weak or tired      stomach pain with or without nausea and vomiting     Side effects that usually do not require medical attention (report to your doctor or  health care professional if they continue or are bothersome):      changes in taste      cough      dizziness      fever      headache      sensitivity to light    What may interact with this medicine?   Do not take this medicine with any of the following medications:      hymenoptera venomThis medicines may also interact with the following medications:      aliskiren      angiotensin receptor blockers, like losartan or valsartan      certain medicines for diabetes      diuretics      everolimus      gold compounds      lithium      NSAIDs, medicines for pain and inflammation, like ibuprofen or naproxen      potassium salts or supplements      salt substitutes      sirolimus      temsirolimus    What if I miss a dose?   If you miss a dose, take it as soon as you can. If it is almost time for your next dose, take only that dose. Do not take double or extra doses.   Where should I keep my medicine?   Keep out of the reach of children.   Store at room temperature between 15 and 30 degrees C (59 and 86 degrees F). Protect from moisture. Keep container tightly closed. Throw away any unused medicine after the expiration date.   What should I tell my health care provider before I take this medicine?   They need to know if you have any of these conditions:      diabetes      heart or blood vessel disease      kidney disease      low blood pressure      previous swelling of the tongue, face, or lips with difficulty breathing, difficulty swallowing, hoarseness, or tightening of the throat      an unusual or allergic reaction to lisinopril , other ACE inhibitors, insect venom, foods, dyes, or preservatives      pregnant or trying to get pregnant      breast-feeding    What should I watch for while using this medicine?   Visit your doctor or health care professional for regular check ups. Check your blood pressure as directed. Ask your doctor what your blood pressure should be, and when you should  contact him or her.   Do not treat  yourself for coughs, colds, or pain while you are using this medicine without asking your doctor or health care professional for advice. Some ingredients may increase your blood pressure.   Women should inform their doctor if they wish to become pregnant or think they might be pregnant. There is a potential for serious side effects to an unborn child. Talk to your health care professional or pharmacist for more information.   Check with your doctor or health care professional if you get an attack of severe diarrhea, nausea and vomiting, or if you sweat a lot. The loss of too much body fluid can make it dangerous for you to take this medicine.   You may get drowsy or dizzy. Do not drive, use machinery, or do anything that needs mental alertness until you know how this drug affects you. Do not stand or sit up quickly, especially if you are an older patient. This reduces the risk of dizzy or fainting spells. Alcohol can make you more drowsy and dizzy. Avoid alcoholic drinks.   Avoid salt substitutes unless you are told otherwise by your doctor or health care professional.          NOTE:This sheet is a summary. It may not cover all possible information. If you have questions about this medicine, talk to your doctor, pharmacist, or health care provider. Copyright 2017 [employer] Standard      Gastritis, Adult    Gastritis is soreness and swelling (inflammation) of the lining of the stomach. Gastritis can develop as a sudden onset (acute) or long-term (chronic) condition. If gastritis is not treated, it can lead to stomach bleeding and ulcers.      CAUSES    Gastritis occurs when the stomach lining is weak or damaged. Digestive juices from the stomach then inflame the weakened stomach lining. The stomach lining may be weak or damaged due to viral or bacterial infections. One common bacterial infection is the Helicobacter pylori  infection. Gastritis can also result from excessive alcohol  consumption, taking certain medicines, or having too much acid in the stomach.     SYMPTOMS    In some cases, there are no symptoms. When symptoms are present, they may include:     Pain or a burning sensation in the upper abdomen.     Nausea.     Vomiting.     An uncomfortable feeling of fullness after eating.     DIAGNOSIS    Your caregiver may suspect you have gastritis based on your symptoms and a physical exam. To determine the cause of your gastritis, your caregiver may perform the following:     Blood or stool tests to check for the H pylori bacterium.     Gastroscopy. A thin, flexible tube (endoscope) is passed down the esophagus and into the stomach. The endoscope has a light and camera on the end. Your caregiver uses the endoscope to view the inside of the stomach.     Taking a tissue sample (biopsy) from the stomach to examine under a microscope.     TREATMENT    Depending on the cause of your gastritis, medicines may be prescribed. If you have a bacterial infection, such as an H pylori infection, antibiotics may be given. If your gastritis is caused by too much acid in the stomach, H2 blockers or antacids may be given. Your caregiver may recommend that you stop taking aspirin , ibuprofen, or other nonsteroidal anti-inflammatory drugs (NSAIDs).    HOME CARE INSTRUCTIONS  Only take over-the-counter or prescription medicines as directed by your caregiver.     If you were given antibiotic medicines, take them as directed. Finish them even if you start to feel better.     Drink enough fluids to keep your urine clear or pale yellow.      Avoid foods and drinks that make your symptoms worse, such as:    ? Caffeine or alcoholic drinks.    ? Chocolate.    ? Peppermint or mint flavorings.    ? Garlic and onions.    ? Spicy foods.    ? Citrus fruits, such as oranges, lemons, or limes.    ? Tomato-based foods such as sauce, chili, salsa, and pizza.    ? Fried and fatty foods.     Eat small, frequent meals instead  of large meals.    SEEK IMMEDIATE MEDICAL CARE IF:     You have black or dark red stools.     You vomit blood or material that looks like coffee grounds.     You are unable to keep fluids down.     Your abdominal pain gets worse.     You have a fever.     You do not feel better after 1 week.      You have any other questions or concerns.    MAKE SURE YOU:     Understand these instructions.     Will watch your condition.     Will get help right away if you are not doing well or get worse.    This information is not intended to replace advice given to you by your health care provider. Make sure you discuss any questions you have with your health care provider.    Document Released: 03/25/2001 Document Revised: 09/30/2011 Document Reviewed: 05/14/2011  Elsevier Interactive Patient Education ?2016 [employer]       Dizziness    Dizziness is a common problem. It is a feeling of unsteadiness or light-headedness. You may feel like you are about to faint. Dizziness can lead to injury if you stumble or fall. Anyone can become dizzy, but dizziness is more common in older adults. This condition can be caused by a number of things, including medicines, dehydration, or illness.      HOME CARE INSTRUCTIONS    Taking these steps may help with your condition:    Eating and Drinking     Drink enough fluid to keep your urine clear or pale yellow. This helps to keep you from becoming dehydrated. Try to drink more clear fluids, such as water.     Do not drink alcohol.     Limit your caffeine intake if directed by your health care provider.     Limit your salt intake if directed by your health care provider.    Activity     Avoid making quick movements.    ? Rise slowly from chairs and steady yourself until you feel okay.    ? In the morning, first sit up on the side of the bed. When you feel okay, stand slowly while you hold onto something until you know that your balance is fine.     Move your legs often if you need to stand in one  place for a long time. Tighten and relax your muscles in your legs while you are standing.     Do not drive or operate heavy machinery if you feel dizzy.     Avoid  bending down if you feel dizzy. Place items in your home so that they are easy for you to reach without leaning over.    Lifestyle     Do not use any tobacco products, including cigarettes, chewing tobacco, or electronic cigarettes. If you need help quitting, ask your health care provider.     Try to reduce your stress level, such as with yoga or meditation. Talk with your health care provider if you need help.    General Instructions     Watch your dizziness for any changes.     Take medicines only as directed by your health care provider. Talk with your health care provider if you think that your dizziness is caused by a medicine that you are taking.     Tell a friend or a family member that you are feeling dizzy. If he or she notices any changes in your behavior, have this person call your health care provider.     Keep all follow-up visits as directed by your health care provider. This is important.    SEEK MEDICAL CARE IF:     Your dizziness does not go away.     Your dizziness or light-headedness gets worse.     You feel nauseous.     You have reduced hearing.     You have new symptoms.     You are unsteady on your feet or you feel like the room is spinning.    SEEK IMMEDIATE MEDICAL CARE IF:     You vomit or have diarrhea and are unable to eat or drink anything.     You have problems talking, walking, swallowing, or using your arms, hands, or legs.     You feel generally weak.     You are not thinking clearly or you have trouble forming sentences. It may take a friend or family member to notice this.     You have chest pain, abdominal pain, shortness of breath, or sweating.     Your vision changes.     You notice any bleeding.     You have a headache.     You have neck pain or a stiff neck.     You have a fever.    This information is not intended  to replace advice given to you by your health care provider. Make sure you discuss any questions you have with your health care provider.    Document Released: 09/24/2000 Document Revised: 08/15/2014 Document Reviewed: 03/27/2014  Elsevier Interactive Patient Education ?2016 [employer]             IS IT A STROKE? Act FAST and Check for these signs:    FACE                         Does the face look uneven?    ARM                         Does one arm drift down?    SPEECH                    Does their speech sound strange?    TIME                         Call 9-1-1 at any sign of stroke  ---------------------------------------------------------------------------------------------------------------------------  Heart Attack Signs  Chest discomfort: Most heart  attacks involve discomfort in the center of the chest and lasts more than a few minutes, or goes away and comes back. It can feel like uncomfortable pressure, squeezing, fullness or pain.  Discomfort in upper body: Symptoms can include pain or discomfort in one or both arms, back, neck, jaw or stomach.  Shortness of breath: With or without discomfort.  Other signs: Breaking out in a cold sweat, nausea, or lightheaded.  Remember, MINUTES DO MATTER. If you experience any of these heart attack warning signs, call 9-1-1 to get immediate medical attention!     ---------------------------------------------------------------------------------------------------------------------------  [facility] allows you to manage your health, view your test results, and retrieve your discharge documents from your hospital stay securely and conveniently from your computer.  To begin the enrollment process, visit [email or web address] Click on "Sign up now" under [facility].

## 2017-06-28 NOTE — Discharge Summary (Signed)
 Inpatient Clinical Summary             [facility]  Post-Acute Care Transfer Instructions  PERSON INFORMATION   Name: Allison [name], Allison [name]  MRN: [id number]    FIN#: [id number]   PHYSICIANS  Admitting Physician: TAMALA HINDERS  Attending Physician: TAMALA HINDERS   PCP: LORENZO MAGDALENA CROME  Discharge Diagnosis:  Dizziness; Epigastric pain  Comment:       PATIENT EDUCATION INFORMATION  Instructions:             Omeprazole; Sodium Bicarbonate oral capsule; Meclizine  tablets or capsules; Lisinopril  tablets; Gastritis, Adult; Dizziness  Medication Leaflets:               Follow-up:                           With: Address: When:   [facility] [address], [address] [place], [place] [address]  [phone] Business (1) Within 1 to 2 weeks   Comments:   Follow up after discharge from rehab                             MEDICATION LIST  Medication Reconciliation at Discharge:          New Medications  Other Medications  acetaminophen  (acetaminophen  325 mg oral tablet) 2 Tabs Oral (given by mouth) every 4 hours as needed mild pain (1-3).  Last Dose:____________________  lisinopril  (lisinopril  10 mg oral tablet) 1 Tabs Oral (given by mouth) At Bedtime (Once a Day). Refills: 0.  Last Dose:____________________  lisinopril  (lisinopril  20 mg oral tablet) 1 Tabs Oral (given by mouth) every day.  Last Dose:____________________  meclizine  (meclizine  12.5 mg oral tablet) 2 Tabs Oral (given by mouth) 3 times a day as needed dizziness.  Last Dose:____________________  omeprazole (omeprazole 20 mg oral delayed release tablet) 1 Tabs Oral (given by mouth) every day. Refills: 0.  Last Dose:____________________  Medications that have not changed  Other Medications  amLODIPine  (Norvasc  10 mg oral tablet) 1 Tabs Oral (given by mouth) every day.  Last Dose:____________________  aspirin  (aspirin  81 mg oral tablet) 1 Tabs Oral (given by mouth) every day.  Last Dose:____________________  atorvastatin (atorvastatin 40 mg oral  tablet) 1 Tabs Oral (given by mouth) every day.  Last Dose:____________________  multivitamin with minerals (One-A-Day Women 50 Plus oral tablet) 1 Tabs Oral (given by mouth) every day.  Last Dose:____________________  No Longer Take the Following Medications  hydrALAZINE  (hydrALAZINE  50 mg oral tablet) 1 Tabs Oral (given by mouth) 3 times a day., SOUND ALIKE / LOOK ALIKE - VERIFY DRUG  Stop Taking Reason: Physician Request  hydrALAZINE  (hydrALAZINE  50 mg oral tablet)   Stop Taking Reason:   irbesartan (irbesartan 300 mg oral tablet) 1 Tabs Oral (given by mouth) every day.  Stop Taking Reason: Physician Request         Patient's Final Home Medication List Upon Discharge:           acetaminophen  (acetaminophen  325 mg oral tablet) 2 Tabs Oral (given by mouth) every 4 hours as needed mild pain (1-3).  amLODIPine  (Norvasc  10 mg oral tablet) 1 Tabs Oral (given by mouth) every day.  aspirin  (aspirin  81 mg oral tablet) 1 Tabs Oral (given by mouth) every day.  atorvastatin (atorvastatin 40 mg oral tablet) 1 Tabs Oral (given by mouth) every day.  lisinopril  (lisinopril  10 mg oral  tablet) 1 Tabs Oral (given by mouth) At Bedtime (Once a Day). Refills: 0.  lisinopril  (lisinopril  20 mg oral tablet) 1 Tabs Oral (given by mouth) every day.  meclizine  (meclizine  12.5 mg oral tablet) 2 Tabs Oral (given by mouth) 3 times a day as needed dizziness.  multivitamin with minerals (One-A-Day Women 50 Plus oral tablet) 1 Tabs Oral (given by mouth) every day.  omeprazole (omeprazole 20 mg oral delayed release tablet) 1 Tabs Oral (given by mouth) every day. Refills: 0.         Comment:       ORDERS          Order Name Order Details   Discharge Patient 06/28/17 9:41:00 EDT, Discharge to Acute Care Facility

## 2017-06-28 NOTE — Nursing Note (Signed)
Nursing Discharge Summary - Text       Nursing Discharge Summary Entered On:  06/28/2017 15:32 EDT    Performed On:  06/28/2017 15:26 EDT by Burnadette Popimmons, RN, Alfredo Martinezreva M               DC Information   Discharge To, Anticipated :   Acute Rehab   Transportation :   Ground ambulance   Accompanied By :   Daughter, Alphonzo GrieveFriend   Mode of Discharge :   Roxana HiresStretcher   Timmons, RN, Alfredo Martinezreva M - 06/28/2017 15:26 EDT   Education   Responsible Learner(s) :   Living Situation: Home independently        Performed by: Idamae SchullerPATTERSON,  [name] - 06/26/2017 11:13  Discharge To: Skilled Nursing Facility - Short Term        Performed by: Idamae SchullerPATTERSON,  [name] - 06/28/2017 14:33     Home Caregiver Present for Session :   No   Barriers To Learning :   None evident   Teaching Method :   Explanation, Printed materials   Elenor Legatoimmons, RN, Treva M - 06/28/2017 15:26 EDT   Post-Hospital Education Adult Grid   Activity Expectations :   Bristol-Myers SquibbVerbalizes understanding   Community Resources :   [employer] SquibbVerbalizes understanding   Disease Process :   Verbalizes understanding   Importance of Follow-Up Visits :   TEFL teacherVerbalizes understanding   Physical Limitations :   Verbalizes understanding   Plan of Care :   Verbalizes understanding   When to Call Health Care Provider :   [employer] SquibbVerbalizes understanding   Burnadette Popimmons, RAlfredo Martinez, Treva M - 06/28/2017 15:26 EDT   Health Maintenance Education Adult Grid   Allergies :   Verbalizes understanding   Diet/Nutrition :   Public house managerVerbalizes understanding   Timmons, RN, Alfredo Martinezreva M - 06/28/2017 15:26 EDT   Medication Education Adult Grid   Drug to Food Interactions :   Verbalizes understanding   Med Dosage, Route, Scheduling :   TEFL teacherVerbalizes understanding, Needs practice/supervision   Med Teacher, musicpecial Administration, Storage :   [employer] SquibbVerbalizes understanding   Medication Precautions :   Verbalizes understanding   Burnadette Popimmons, Radiographer, therapeuticN, Treva M - 06/28/2017 15:26 EDT   Safety Education Adult Grid   Safety, Fall :   Verbalizes understanding   [place], RN, Alfredo Martinezreva M - 06/28/2017 15:26 EDT   Additional Learner(s)  Present :   Daughter, Family member, Friend   Time Spent Educating Patient :   20 minutes   Burnadette Popimmons, Radiographer, therapeuticN, Treva M - 06/28/2017 15:26 EDT

## 2017-06-28 NOTE — Case Communication (Signed)
CM Discharge Planning Assessment - Text       CM Discharge Planning Ongoing Assessment Entered On:  06/28/2017 14:35 EDT    Performed On:  06/28/2017 14:33 EDT by Cloria Spring R               Discharge Needs I   Previously Documented Discharge Needs :   DISCHARGE PLAN/NEEDS:  Anticipated Discharge Date: 06/28/2017 - [name] - 06/26/17 15:01:00  Discharge To, Anticipated: [place],  [name] - 06/26/17 15:01:00  Needs Assistance with Transportation: No - Cloria Spring R - 06/26/17 15:01:00  EQUIPMENT/TREATMENT NEEDS:  Needs Assistance at Home Upon Discharge: Yes [name] - 06/26/17 15:01:00  Professional Skilled Services: Nursing, Physical Therapy - Cloria Spring R - 06/26/17 15:01:00       Previously Documented Benefits Information :   Performed By: Cloria Spring R  - 06/26/17 11:13:00    Performed By: Maryfrances Bunnell  - 06/25/17 10:19:00       Anticipated Discharge Date :   06/28/2017 EDT   Anticipated Discharge Time Slot :   1400-1600   Discharge To :   [place] Caregiver Name/Relationship CM :   Neighbor: Allison [name] [phone]  Allison [name] (dtr) [phone]   CM Progress Note :   03/14: Patient admitted for dizziness and epigastric pain.  Patient is a/o and lives at home alone.  Patient is independent with ADLs.  Patient has no DME or home health. Patient is in OBS status.  MOON letter discussed and signed by patient. PT/OT to eval.  Awaiting recommedations.  CM to follow. (JLD)    3/15 RRP I met with pt to followup.  PT rec rehab.  Gave her SNF list.  She chose [facility], referral sent.  PPD ordered today.  3rd night stay will be sat night, possible dc Sunday.  MD notified.  She was changed to inpt yesterday, IM letter reviewed and signed today.  Her granddaughter plans to move in with her to help.  Her daughter is coming into town Sunday.  Packet started and transport forms filled  out.  Spoke with pt's dtr Allison [name], she will eb here Sunday to help transition from hospital to SNF.    3/17 RRP Pt stable for dc to [place] today.  Her IM from friday still good.  Transport arranged via [facility].  DC paperwork sent via [facility] and faxed to [place]     [name] - 06/28/2017 14:33 EDT   Discharge Needs II   DischargDischarge Device/Equipment CMe Device/Equipment CM :   None   Professional Skilled Services :   Nursing, Physical Therapy   Needs Assistance with Transportation :   No   Discharge Transportation Arranged :   N/A   Needs Assistance at Home Upon Discharge :   Yes   Requires Caregiver Involvement :   No   Discharge Planning Time Spent :   20 minutes   [name] - 06/28/2017 14:33 EDT   Discharge Planning   Discharge Arrangements :       Patient Post-Acute Information    Patient Name: Allison [name], Allison [name]  MRN: [id number]  FIN: [id number]  Gender: Female  DOB: 07/24/2028  Age:  84 Years        *** No Post-Acute Placement(s) Listed ***          ***  No Post-Acute Service(s) Listed [facility] Services :   [place] [phone]  [facility] [phone]     Discharge Options Discussed with Patient :   Nursing home   Interventions Performed :   All resolved   Discharge Plan Discussion :   Discussed with patient, Patient agrees with plan   Discharge Medicare Message Date/Time :   06/26/2017 11:16 EDT   Choice Lists Provided :   Skilled nursing facility   Barriers to Discharge Identified :   None identified   [name] - 06/28/2017 14:33 EDT   Discharge Planning Assessment Status   Discharge Planning Assessment Complete :   Yes   Cloria Spring R - 06/28/2017 14:33 EDT

## 2017-12-28 LAB — COMPREHENSIVE METABOLIC PANEL
ALT: 18 U/L (ref 0–33)
AST: 23 U/L (ref 0–32)
Albumin/Globulin Ratio: 1.47 mmol/L (ref 1.00–2.00)
Albumin: 4.7 g/dL (ref 3.5–5.2)
Alk Phosphatase: 66 U/L (ref 35–117)
Anion Gap: 12 mmol/L (ref 2–17)
BUN: 9 mg/dL (ref 8–23)
CO2: 28 mmol/L (ref 22–29)
Calcium: 9.6 mg/dL (ref 8.8–10.2)
Chloride: 104 mmol/L (ref 98–107)
Creatinine: 0.7 mg/dL (ref 0.5–0.9)
GFR African American: 90 mL/min/{1.73_m2} (ref >=90)
GFR Non-African American: 78 mL/min/{1.73_m2} — ABNORMAL LOW (ref >=90)
Globulin: 3.2 g/dL (ref 1.9–4.4)
Glucose: 106 mg/dL — ABNORMAL HIGH (ref 70–99)
Potassium: 3.7 mmol/L (ref 3.5–5.3)
Sodium: 144 mmol/L (ref 135–145)
Total Bilirubin: 0.5 mg/dL (ref 0.00–1.20)
Total Protein: 7.9 g/dL (ref 6.4–8.3)

## 2017-12-28 LAB — CBC
Hematocrit: 39.8 % (ref 34.0–47.0)
Hemoglobin: 13.7 g/dL (ref 11.5–15.7)
MCH: 28.9 pg (ref 27.0–34.5)
MCHC: 34.5 g/dL (ref 32.0–36.0)
MCV: 83.7 fL (ref 81.0–99.0)
MPV: 8.9 fL (ref 7.2–11.2)
Platelets: 172 10*3/uL (ref 140–440)
RBC: 4.75 x10e6/mcL (ref 3.60–5.20)
RDW: 14.3 % (ref 11.0–16.0)
WBC: 4.8 10*3/uL (ref 3.8–10.6)

## 2017-12-28 LAB — TROPONIN T: Troponin T: <0.01 ng/mL (ref 0.000–0.010)

## 2017-12-28 NOTE — Nursing Note (Signed)
Adult Patient History Form-Text       [name]:  12/28/2017 18:20 EDT    Performed On:  12/28/2017 17:57 EDT by [name], RN, Dellia Cloud Info   Patient Identified :   Identification band, Verbal   Patient Identified :   Allison [name]   Information Given By :   Self   Preferred Mode of Communication :   Verbal   Accompanied By :   Family member   In Charge of News (ICON) Name :   Mort Sawyers [phone]  [facility] [phone]   Pregnancy Status :   N/A   Has the patient received chemotherapy or biotherapy within the last 48 hours? :   No   In Clinical Trial With Signed Consent for Related Condition :   No signed consent for clinical trial   Is the patient currently (2-3 days) receiving radiation treatment? :   No   [name] RN, Huntley Dec - 12/28/2017 17:57 EDT   Allergies   (As Of: 12/28/2017 18:20:51 EDT)   Allergies (Active)   Chocolate  Estimated Onset Date:   Unspecified ; Created By:   Judi Saa RN, KRISTINA A; Reaction Status:   Active ; Category:   Drug ; Substance:   Chocolate ; Type:   Allergy ; Severity:   Moderate ; Updated By:   Judi Saa RN, Anabel Bene; Reviewed Date:   12/28/2017 18:00 EDT      Peaches  Estimated Onset Date:   Unspecified ; Created By:   Judi Saa RN, KRISTINA A; Reaction Status:   Active ; Category:   Drug ; Substance:   Peaches ; Type:   Allergy ; Severity:   Moderate ; Updated By:   Judi Saa RN, Anabel Bene; Reviewed Date:   12/28/2017 18:00 EDT      shellfish  Estimated Onset Date:   Unspecified ; Reactions:   facial swelling ; Created By:   Jennet Maduro, RN, Ciara; Reaction Status:   Active ; Category:   Drug ; Substance:   shellfish ; Type:   Allergy ; Severity:   Severe ; Updated By:   Jennet Maduro, RN, Ciara; Reviewed Date:   12/28/2017 18:00 EDT        Medication History   Medication List   (As Of: 12/28/2017 18:20:51 EDT)   Normal Order    amLODIPine 10 mg Tab  :   amLODIPine 10 mg Tab ; Status:   Ordered ; Ordered As Mnemonic:   Norvasc ; Simple Display Line:    10 mg, 1 tabs, Oral, Daily ; Ordering Provider:   [name], MD, Madlyn Frankel; Catalog Code:   amLODIPine ; Order Dt/Tm:   12/28/2017 17:31:15 EDT          aspirin 81 mg Chew Tab  :   aspirin 81 mg Chew Tab ; Status:   Ordered ; Ordered As Mnemonic:   aspirin ; Simple Display Line:   81 mg, 1 tabs, Oral, Daily ; Ordering Provider:   [name], MD, Madlyn Frankel; Catalog Code:   aspirin ; Order Dt/Tm:   12/28/2017 17:59:24 EDT          aspirin  :   aspirin ; Status:   Voided ; Ordered As Mnemonic:   aspirin 81 mg oral tablet ; Simple Display Line:   81 mg, 1 tabs, Oral, Daily ; Ordering Provider:   [name], MD, Madlyn Frankel; Catalog Code:  aspirin ; Order Dt/Tm:   12/28/2017 17:31:16 EDT          atorvastatin 40 mg Tab  :   atorvastatin 40 mg Tab ; Status:   Ordered ; Ordered As Mnemonic:   atorvastatin ; Simple Display Line:   40 mg, 1 tabs, Oral, Daily ; Ordering Provider:   [name], MD, Madlyn Frankel; Catalog Code:   atorvastatin ; Order Dt/Tm:   12/28/2017 17:31:18 EDT          lisinopril 20 mg Tab  :   lisinopril 20 mg Tab ; Status:   Ordered ; Ordered As Mnemonic:   lisinopril ; Simple Display Line:   20 mg, 1 tabs, Oral, Daily ; Ordering Provider:   [name], MD, Madlyn Frankel; Catalog Code:   lisinopril ; Order Dt/Tm:   12/28/2017 17:31:25 EDT          Therapeutic Multiple Vitamins with Minerals Tab  :   Therapeutic Multiple Vitamins with Minerals Tab ; Status:   Ordered ; Ordered As Mnemonic:   Centrum Silver ; Simple Display Line:   1 tabs, Oral, Daily ; Ordering Provider:   [name], MD, Madlyn Frankel; Catalog Code:   multivitamin with minerals ; Order Dt/Tm:   12/28/2017 17:59:05 EDT          multivitamin with minerals  :   multivitamin with minerals ; Status:   Voided ; Ordered As Mnemonic:   One-A-Day Women 50 Plus oral tablet ; Simple Display Line:   1 tabs, Oral, Daily ; Ordering Provider:   [name], MD, Madlyn Frankel; Catalog Code:   multivitamin with minerals ; Order Dt/Tm:   12/28/2017 17:31:29 EDT          omeprazole 20  mg DR Cap  :   omeprazole 20 mg DR Cap ; Status:   Ordered ; Ordered As Mnemonic:   omeprazole ; Simple Display Line:   20 mg, 1 caps, Oral, Daily ; Ordering Provider:   [name], MD, Madlyn Frankel; Catalog Code:   omeprazole ; Order Dt/Tm:   12/28/2017 17:56:54 EDT          hydrALAZINE 25 mg Tab  :   hydrALAZINE 25 mg Tab ; Status:   Ordered ; Ordered As Mnemonic:   hydrALAZINE ; Simple Display Line:   25 mg, 1 tabs, Oral, TID ; Ordering Provider:   [name], MD, Madlyn Frankel; Catalog Code:   hydrALAZINE ; Order Dt/Tm:   12/28/2017 17:34:57 EDT ; Comment:   SOUND ALIKE / LOOK ALIKE - VERIFY DRUG          acetaminophen 325 mg Tab  :   acetaminophen 325 mg Tab ; Status:   Ordered ; Ordered As Mnemonic:   acetaminophen ; Simple Display Line:   650 mg, 2 tabs, Oral, TID, PRN: mild pain (1-3) ; Ordering Provider:   [name], MD, Madlyn Frankel; Catalog Code:   acetaminophen ; Order Dt/Tm:   12/28/2017 17:56:53 EDT          aluminum hydroxide/magnesium hydroxide/simethicone 200 mg-200 mg-20 mg/5 mL  :   aluminum hydroxide/magnesium hydroxide/simethicone 200 mg-200 mg-20 mg/5 mL ; Status:   Ordered ; Ordered As Mnemonic:   aluminum hydroxide/magnesium hydroxide/simethicone 200 mg-200 mg-20 mg/5 mL oral suspension ; Simple Display Line:   30 mL, Oral, q3hr, PRN: indigestion ; Ordering Provider:   [name], MD, Madlyn Frankel; Catalog Code:   Al hydroxide/Mg hydroxide/simethicone ; Order Dt/Tm:   12/28/2017 17:56:54 EDT  bisacodyl 10 mg Rectal Supp  :   bisacodyl 10 mg Rectal Supp ; Status:   Ordered ; Ordered As Mnemonic:   bisacodyl ; Simple Display Line:   10 mg, 1 supp, PR, Once a Day (at bedtime), PRN: constipation ; Ordering Provider:   [name], MD, Madlyn Frankel; Catalog Code:   bisacodyl ; Order Dt/Tm:   12/28/2017 17:56:54 EDT          docusate-senna 50 mg-8.6 mg Tab  :   docusate-senna 50 mg-8.6 mg Tab ; Status:   Ordered ; Ordered As Mnemonic:   docusate-senna 50 mg-8.6 mg oral tablet ; Simple Display Line:   1 tabs, Oral,  BID, PRN: constipation ; Ordering Provider:   [name], MD, Madlyn Frankel; Catalog Code:   docusate-senna ; Order Dt/Tm:   12/28/2017 17:56:54 EDT ; Comment:   Use first as oral treatment PRN for constipation          magnesium hydroxide 8% Oral Susp 30 mL  :   magnesium hydroxide 8% Oral Susp 30 mL ; Status:   Ordered ; Ordered As Mnemonic:   magnesium hydroxide ; Simple Display Line:   30 mL, Oral, q6hr, PRN: constipation ; Ordering Provider:   [name], MD, Madlyn Frankel; Catalog Code:   magnesium hydroxide ; Order Dt/Tm:   12/28/2017 17:56:55 EDT ; Comment:   May use MOM  if Senokot-S does not relieve constipation          ondansetron 2 mg/mL Inj Soln 2 mL  :   ondansetron 2 mg/mL Inj Soln 2 mL ; Status:   Ordered ; Ordered As Mnemonic:   ondansetron ; Simple Display Line:   4 mg, 2 mL, IV Push, q6hr, PRN: nausea/vomiting ; Ordering Provider:   [name], MD, Madlyn Frankel; Catalog Code:   ondansetron ; Order Dt/Tm:   12/28/2017 17:56:54 EDT          cloNIDine 0.1 mg Tab  :   cloNIDine 0.1 mg Tab ; Status:   Ordered ; Ordered As Mnemonic:   cloNIDine ; Simple Display Line:   0.1 mg, 1 tabs, Oral, q8hr, PRN: hypertension ; Ordering Provider:   [name], MD, Madlyn Frankel; Catalog Code:   cloNIDine ; Order Dt/Tm:   12/28/2017 17:35:21 EDT ; Comment:   SOUND ALIKE / LOOK ALIKE - VERIFY DRUG          hydrALAZINE 20 mg/mL Inj Soln 1 mL  :   hydrALAZINE 20 mg/mL Inj Soln 1 mL ; Status:   Ordered ; Ordered As Mnemonic:   hydrALAZINE ; Simple Display Line:   10 mg, 0.5 mL, IV Push, q6hr, PRN: hypertension ; Ordering Provider:   [name], MD, Madlyn Frankel; Catalog Code:   hydrALAZINE ; Order Dt/Tm:   12/28/2017 17:34:21 EDT ; Comment:   Provider should specify BP parameters at order entry to meet JC standards  SOUND ALIKE / LOOK ALIKE - VERIFY DRUG          lisinopril 5 mg Tab  :   lisinopril 5 mg Tab ; Status:   Completed ; Ordered As Mnemonic:   lisinopril ; Simple Display Line:   10 mg, 2 tabs, Oral, Once ; Ordering Provider:    Yolanda Manges; Catalog Code:   lisinopril ; Order Dt/Tm:   12/28/2017 14:14:45 EDT          amLODIPine 5 mg Tab  :   amLODIPine 5 mg Tab ; Status:   Completed ; Ordered As Mnemonic:   Norvasc ;  Simple Display Line:   10 mg, 2 tabs, Oral, Once ; Ordering Provider:   Yolanda Manges; Catalog Code:   amLODIPine ; Order Dt/Tm:   12/28/2017 14:14:30 EDT          aspirin 81 mg Chew Tab  :   aspirin 81 mg Chew Tab ; Status:   Completed ; Ordered As Mnemonic:   aspirin ; Simple Display Line:   324 mg, 4 tabs, Chewed, Once ; Ordering Provider:   Yolanda Manges; Catalog Code:   aspirin ; Order Dt/Tm:   12/28/2017 12:56:31 EDT          meclizine 12.5 mg Tab  :   meclizine 12.5 mg Tab ; Status:   Completed ; Ordered As Mnemonic:   Antivert ; Simple Display Line:   25 mg, 2 tabs, Oral, Once ; Ordering Provider:   Yolanda Manges; Catalog Code:   meclizine ; Order Dt/Tm:   12/28/2017 12:56:32 EDT            Prescription/Discharge Order    omeprazole  :   omeprazole ; Status:   Voided ; Ordered As Mnemonic:   omeprazole 20 mg oral delayed release tablet ; Simple Display Line:   20 mg, 1 tabs, Oral, Daily, 30 tabs, 0 Refill(s) ; Ordering Provider:   Ottis Stain; Catalog Code:   omeprazole ; Order Dt/Tm:   06/28/2017 09:35:57 EDT          lisinopril  :   lisinopril ; Status:   Prescribed ; Ordered As Mnemonic:   lisinopril 10 mg oral tablet ; Simple Display Line:   10 mg, 1 tabs, Oral, At Bedtime (Once a Day), 30 tabs, 0 Refill(s) ; Ordering Provider:   Ottis Stain; Catalog Code:   lisinopril ; Order Dt/Tm:   06/28/2017 08:59:39 EDT            Home Meds    furosemide  :   furosemide ; Status:   Documented ; Ordered As Mnemonic:   Lasix ; Simple Display Line:   10 mg, Daily, 0 Refill(s) ; Catalog Code:   furosemide ; Order Dt/Tm:   12/28/2017 12:08:27 EDT          acetaminophen  :   acetaminophen ; Status:   Discontinued ; Ordered As Mnemonic:   acetaminophen 325 mg oral tablet ; Simple Display Line:   650 mg, 2 tabs,  Oral, q4hr, PRN: mild pain (1-3), 0 Refill(s) ; Ordering Provider:   Ottis Stain; Catalog Code:   acetaminophen ; Order Dt/Tm:   06/28/2017 08:57:05 EDT          lisinopril  :   lisinopril ; Status:   Documented ; Ordered As Mnemonic:   lisinopril 20 mg oral tablet ; Simple Display Line:   20 mg, 1 tabs, Oral, Daily, 0 Refill(s) ; Ordering Provider:   Ottis Stain; Catalog Code:   lisinopril ; Order Dt/Tm:   06/28/2017 08:57:17 EDT          meclizine  :   meclizine ; Status:   Voided ; Ordered As Mnemonic:   meclizine 12.5 mg oral tablet ; Simple Display Line:   25 mg, 2 tabs, Oral, TID, PRN: dizziness, 0 Refill(s) ; Ordering Provider:   Ottis Stain; Catalog Code:   meclizine ; Order Dt/Tm:   06/28/2017 08:57:19 EDT          amLODIPine  :   amLODIPine ; Status:   Documented ;  Ordered As Mnemonic:   Norvasc 10 mg oral tablet ; Simple Display Line:   10 mg, 1 tabs, Oral, Daily, 0 Refill(s) ; Catalog Code:   amLODIPine ; Order Dt/Tm:   06/25/2017 12:51:45 EDT          aspirin  :   aspirin ; Status:   Documented ; Ordered As Mnemonic:   aspirin 81 mg oral tablet ; Simple Display Line:   81 mg, 1 tabs, Oral, Daily, 0 Refill(s) ; Catalog Code:   aspirin ; Order Dt/Tm:   06/25/2017 12:54:01 EDT          multivitamin with minerals  :   multivitamin with minerals ; Status:   Documented ; Ordered As Mnemonic:   One-A-Day Women 50 Plus oral tablet ; Simple Display Line:   1 tabs, Oral, Daily, 30 tabs, 0 Refill(s) ; Catalog Code:   multivitamin with minerals ; Order Dt/Tm:   12/28/2017 18:01:25 EDT          atorvastatin  :   atorvastatin ; Status:   Documented ; Ordered As Mnemonic:   atorvastatin 40 mg oral tablet ; Simple Display Line:   40 mg, 1 tabs, Oral, Daily, 0 Refill(s) ; Ordering Provider:   Nadean Corwin; Catalog Code:   atorvastatin ; Order Dt/Tm:   10/28/2015 08:23:11 EDT            Problem History   (As Of: 12/28/2017 18:20:51 EDT)   Problems(Active)    At risk for falls (SNOMED CT  :[id number] )   Name of Problem:   At risk for falls ; Onset Date:   06/25/2017 ; Recorder:   SYSTEM,  SYSTEM; Confirmation:   Confirmed ; Classification:   Interdisciplinary ; Code:   [phone] ; Last Updated:   06/25/2017 2:38 EDT ; Life Cycle Date:   06/25/2017 ; Life Cycle Status:   Active ; Vocabulary:   SNOMED CT   ; Comments:        06/25/2017 2:38 - SYSTEM,  SYSTEM  Problem added by Discern Expert      HLD (hyperlipidemia) (SNOMED CT  :[id number] )  Name of Problem:   HLD (hyperlipidemia) ; Recorder:   BELLEW, RN, KRISTY M; Confirmation:   Confirmed ; Classification:   Patient Stated ; Code:   [id number] ; Contributor System:   PowerChart ; Last Updated:   06/24/2017 16:16 EDT ; Life Cycle Date:   06/24/2017 ; Life Cycle Status:   Active ; Vocabulary:   SNOMED CT        HTN (hypertension) (SNOMED CT  :[phone] )  Name of Problem:   HTN (hypertension) ; Recorder:   Daphine Deutscher, RN, Apolinar Junes; Confirmation:   Confirmed ; Classification:   Medical ; Code:   [phone] ; Contributor System:   PowerChart ; Last Updated:   10/28/2015 8:22 EDT ; Life Cycle Date:   10/28/2015 ; Life Cycle Status:   Active ; Vocabulary:   SNOMED CT          Diagnoses(Active)    Dizziness  Date:   12/28/2017 ; Diagnosis Type:   Reason For Visit ; Confirmation:   Complaint of ; Clinical Dx:   Dizziness ; Classification:   Medical ; Clinical Service:   Emergency medicine ; Code:   PNED ; Probability:   0 ; Diagnosis Code:   [id number]      Dizziness  Date:   12/28/2017 ; Diagnosis Type:   Discharge ; Confirmation:   Confirmed ; Clinical Dx:  Dizziness ; Classification:   Medical ; Clinical Service:   Non-Specified ; Code:   ICD-10-CM ; Probability:   0 ; Diagnosis Code:   R42      Hypertensive emergency  Date:   12/28/2017 ; Diagnosis Type:   Discharge ; Confirmation:   Confirmed ; Clinical Dx:   Hypertensive emergency ; Classification:   Medical ; Clinical Service:   Non-Specified ; Code:    ICD-10-CM ; Probability:   0 ; Diagnosis Code:   I16.1        Procedure History        -    Procedure History   (As Of: 12/28/2017 18:20:51 EDT)     Anesthesia Minutes:   0 ; Procedure Name:   Exploratory laparotomy ; Procedure Minutes:   0 ; Comments:     12/28/2017 18:07 EDT - Janee Mornhompson, RN, Huntley DecSara  Suspected fibroids, but did not find any. ; Last Reviewed Dt/Tm:   12/28/2017 18:07:26 EDT            Immunizations   Last Tetanus :   Greater than 5 years   Randall Hisshompson, RN, Sara - 12/28/2017 17:57 EDT   ID Risk Screen Symptoms   Recent Travel History :   No recent travel   TB Symptom Screen :   No symptoms   C. diff Symptom/History ID :   Neither of the above   Patient Pregnant :   None of the above   MRSA/VRE Screening :   None of these apply   CRE Screening :   Not applicable   Randall Hisshompson, RN, Sara - 12/28/2017 17:57 EDT   Bloodless Medicine   Will Patient Accept Blood Transfusion and/or Blood Products :   Yes   Janee Mornhompson, RN, Huntley DecSara - 12/28/2017 17:57 EDT   Nutrition   MST Does Your Current Diet Include :   None   MST Have You Recently Lost Weight Without Trying? :   No   MST Weight Loss Score :   0    MST Have You Been Eating Poorly? :   No   MST Appetite Score :   0    MST Score :   0    MST Interpretation :   Not at risk   Randall Hisshompson, RN, Sara - 12/28/2017 17:57 EDT   Functional   Sensory Deficits :   None   ADLs Prior to Admission :   Independent   Janee Mornhompson, RHuntley Dec, Sara - 12/28/2017 17:57 EDT   Social History   Social History   (As Of: 12/28/2017 18:20:51 EDT)   Tobacco:        Never smoker   (Last Updated: 10/28/2015 08:23:28 EDT by Daphine DeutscherMARTIN, RN, Apolinar JunesBRANDON)            Spiritual   Faith/Denomination :   [place] Knuck[place]ian, Non-denominational   Do you have a concern that you would like to address with a Chaplain? :   No   Do you have any religious/spiritual/cultural beliefs that could impact the way your care is provided? :   No   Janee Mornhompson, RN, Huntley DecSara - 12/28/2017 17:57 EDT   Harm Screen   Injuries/Abuse/Neglect in Household :   Denies   Feels Unsafe  at Home :   No   Recent Life Events :   None   Family Behavioral Health History :   None   Previous Mental Illness Diagnosis :   None   Suicidal Behavior :   None   Self Harming Behavior :  None   Suicidal Ideation :   None   Randall Hiss - 12/28/2017 17:57 EDT   Advance Directive   Location of Advance Directive :   Unable to obtain copy   Advance Directive :   Yes   Type of Advance Directive :   Living will   Intent of Advance Directive Stated By :   Relative   Medical Decision Maker :   Delrae Alfred- (daughter) POA   Patient Wishes to Receive Further Information on Advance Directives :   No   Randall Hiss - 12/28/2017 17:57 EDT   Education   Written Language :   [place]   Caregiver/Advocate Primary Language :   [place]   Caregiver/Advocate Written Language :   [place]   Primary Language :   Adelene Amas, RN, Huntley Dec - 12/28/2017 17:57 EDT   Caregiver/Advocate Language   Patient :   Verbal explanation   Family :   Verbal explanation   [name] RN, Huntley Dec - 12/28/2017 17:57 EDT   Barriers to Learning :   None evident   Teaching Method :   Explanation   Responsible Learner Present for Session :   Yes   Additional Session Learner(s) Present :   Family member   Randall Hiss - 12/28/2017 17:57 EDT   Preventative Measures Information   Unit/Room Orientation :   [place] understanding   Environmental Safety :   [place] understanding   Hand Washing :   Verbalizes understanding   Infection Prevention :   Verbalizes understanding   DVT Prophylaxis :   Verbalizes understanding   Isolation Precaution :   Verbalizes understanding   [name], RN, Huntley Dec - 12/28/2017 17:57 EDT   DC Needs   Living Situation :   Home with family support   Randall Hiss - 12/28/2017 17:57 EDT   Valuables and Belongings   Does Patient Have Valuables and Belongings :   No   Randall Hiss - 12/28/2017 17:57 EDT   Valuables and Belongings   Sent Home :   Clothes   [name], RNHuntley Dec - 12/28/2017 17:57 EDT   Admission Complete    Admission Complete :   Yes   Randall Hiss - 12/28/2017 17:57 EDT

## 2017-12-29 LAB — BASIC METABOLIC PANEL
Anion Gap: 12 mmol/L (ref 2–17)
BUN: 10 mg/dL (ref 8–23)
CO2: 24 mmol/L (ref 22–29)
Calcium: 9.1 mg/dL (ref 8.8–10.2)
Chloride: 105 mmol/L (ref 98–107)
Creatinine: 0.6 mg/dL (ref 0.5–0.9)
GFR African American: 95 mL/min/{1.73_m2} (ref >=90)
GFR Non-African American: 82 mL/min/{1.73_m2} — ABNORMAL LOW (ref >=90)
Glucose: 94 mg/dL (ref 70–99)
Potassium: 3.3 mmol/L — ABNORMAL LOW (ref 3.5–5.3)
Sodium: 141 mmol/L (ref 135–145)

## 2017-12-29 LAB — URINALYSIS WITH REFLEX TO CULTURE
Bilirubin Urine: NEGATIVE
Blood, Urine: NEGATIVE
Glucose, UA: NEGATIVE mg/dL
Ketones, Urine: NEGATIVE mg/dL
Leukocyte Esterase, Urine: NEGATIVE
Nitrite, Urine: NEGATIVE
Protein, UA: NEGATIVE
Specific Gravity, UA: 1.01 (ref 1.003–1.035)
Urobilinogen, Urine: 0.2 EU/dL
pH, UA: 7.5 (ref 4.5–8.0)

## 2017-12-29 LAB — SEDIMENTATION RATE: Sed Rate: 2 mm/hr (ref 0–30)

## 2017-12-29 LAB — HEPATIC FUNCTION PANEL
ALT: 14 U/L (ref 0–33)
AST: 19 U/L (ref 0–32)
Albumin: 3.6 g/dL (ref 3.5–5.2)
Alk Phosphatase: 55 U/L (ref 35–117)
Bilirubin, Direct: <0.2 mg/dL (ref 0.00–0.30)
Total Bilirubin: 0.5 mg/dL (ref 0.00–1.20)
Total Protein: 6.9 g/dL (ref 6.4–8.3)

## 2017-12-29 LAB — TROPONIN T: Troponin T: <0.01 ng/mL (ref 0.000–0.010)

## 2017-12-29 LAB — CBC
Hematocrit: 38.2 % (ref 34.0–47.0)
Hemoglobin: 13.2 g/dL (ref 11.5–15.7)
MCH: 29 pg (ref 27.0–34.5)
MCHC: 34.6 g/dL (ref 32.0–36.0)
MCV: 83.6 fL (ref 81.0–99.0)
MPV: 9.4 fL (ref 7.2–11.2)
Platelets: 159 10*3/uL (ref 140–440)
RBC: 4.57 x10e6/mcL (ref 3.60–5.20)
RDW: 14.2 % (ref 11.0–16.0)
WBC: 7.8 10*3/uL (ref 3.8–10.6)

## 2017-12-29 LAB — TSH: TSH, 3RD GENERATION: 2.2 mcIU/mL (ref 0.358–3.740)

## 2017-12-29 LAB — VITAMIN B12: Vitamin B-12: 988 pg/mL (ref 232–1245)

## 2017-12-29 LAB — MAGNESIUM: Magnesium: 2.2 mg/dL (ref 1.6–2.6)

## 2017-12-29 LAB — N TERMINAL PROBNP (AKA NTPROBNP): NT Pro-BNP: 281 pg/mL (ref 0–450)

## 2017-12-29 NOTE — Progress Notes (Signed)
 Inpatient PT Examination - Text       Inpatient PT Examination Entered On:  12/29/2017 12:05 EDT    Performed On:  12/29/2017 11:53 EDT by HAMBLIN, PT, PAULA               Reason for Treatment   Subjective Statement :   Pt reports, I was doing well till yesterday when I got dizzy and had difficulty walking.I went to [facility] in March and was there for 3 wks. I graduated and got a certificate for passing everything.     *Reason for Referral :   This is an 89yoF adm with dx: dizziness     precautions: fall risk, dizziness with change in position  EFY:czmuphn, HTN, diastolic heart failure, OA     HAMBLIN, PT, PAULA - 12/29/2017 11:53 EDT   General Info   Physical Therapy Orders :   PT Evaluation and Treatment Acute - 12/28/17 17:56:00 EDT, Stop date 12/28/17 17:56:00 EDT, A consult cannot be completed if there is a bedrest activity order; check for bedrest order.     Precautions RTF :    Communication to Nursing, 12/28/17 17:56:00 EDT, RD may manage/modify diet order and/or enteral nutrition per approved MNT protocol, 12/28/17 17:56:00 EDT, 12/28/17 17:56:00 EDT, Ordered   Hospitalist Team Color, 12/28/17 17:56:00 EDT, Purple, Constant Indicator, Ordered   Notify Rapid Response Team, 12/28/17 17:56:00 EDT, For concerns regarding patient condition & notify MD, 12/28/17 17:56:00 EDT, 12/28/17 17:56:00 EDT, Ordered   Change attending to, 12/28/17 17:12:00 EDT, THOMPSON-MD,  THEODORE TELL, Ordered     Orientation Assessment :   Oriented x 4   Affect/Behavior :   Appropriate, Cooperative   Basic Command Following :   Intact   Pain Present :   No actual or suspected pain   HAMBLIN, PT, PAULA - 12/29/2017 11:53 EDT   Problem List   (As Of: 12/29/2017 12:05:03 EDT)   Problems(Active)    At risk for falls (SNOMED CT  :[id number] )  Name of Problem:   At risk for falls ; Onset Date:   06/25/2017 ; Recorder:   SYSTEM,  SYSTEM; Confirmation:   Confirmed ; Classification:   Interdisciplinary ; Code:   791316981 ; Last Updated:    06/25/2017 2:38 EDT ; Life Cycle Date:   06/25/2017 ; Life Cycle Status:   Active ; Vocabulary:   SNOMED CT   ; Comments:        06/25/2017 2:38 - SYSTEM,  SYSTEM  Problem added by Discern Expert      HLD (hyperlipidemia) (SNOMED CT  :07173982 )  Name of Problem:   HLD (hyperlipidemia) ; Recorder:   BELLEW, RN, KRISTY M; Confirmation:   Confirmed ; Classification:   Patient Stated ; Code:   07173982 ; Contributor System:   PowerChart ; Last Updated:   06/24/2017 16:16 EDT ; Life Cycle Date:   06/24/2017 ; Life Cycle Status:   Active ; Vocabulary:   SNOMED CT        HTN (hypertension) (SNOMED CT  :[phone] )  Name of Problem:   HTN (hypertension) ; Recorder:   GLADIS, RN, PENNE; Confirmation:   Confirmed ; Classification:   Medical ; Code:   [phone] ; Contributor System:   PowerChart ; Last Updated:   10/28/2015 8:22 EDT ; Life Cycle Date:   10/28/2015 ; Life Cycle Status:   Active ; Vocabulary:   SNOMED CT        No contraindication to deep vein thrombosis (DVT)  prophylaxis (SNOMED CT  :[id number] )  Name of Problem:   No contraindication to deep vein thrombosis (DVT) prophylaxis ; Recorder:   THOMPSON-MD,  THEODORE TELL; Confirmation:   Confirmed ; Classification:   Medical ; Code:   386980 ; Contributor System:   PowerChart ; Last Updated:   12/28/2017 18:29 EDT ; Life Cycle Status:   Active ; Responsible Provider:   DARIO RICARDO BONNET; Vocabulary:   SNOMED CT          Diagnoses(Active)    Chronic GERD  Date:   12/28/2017 ; Diagnosis Type:   Discharge ; Confirmation:   Confirmed ; Clinical Dx:   Chronic GERD ; Classification:   Medical ; Clinical Service:   Non-Specified ; Code:   ICD-10-CM ; Probability:   0 ; Diagnosis Code:   K21.9      Diastolic CHF, chronic  Date:   12/28/2017 ; Diagnosis Type:   Discharge ; Confirmation:   Confirmed ; Clinical Dx:   Diastolic CHF, chronic ; Classification:   Medical ; Clinical Service:   Non-Specified ; Code:   ICD-10-CM  ; Probability:   0 ; Diagnosis Code:   I50.32      Dizziness  Date:   12/28/2017 ; Diagnosis Type:   Reason For Visit ; Confirmation:   Complaint of ; Clinical Dx:   Dizziness ; Classification:   Medical ; Clinical Service:   Emergency medicine ; Code:   PNED ; Probability:   0 ; Diagnosis Code:   [id number]      HLD (hyperlipidemia)  Date:   12/28/2017 ; Diagnosis Type:   Discharge ; Confirmation:   Confirmed ; Clinical Dx:   HLD (hyperlipidemia) ; Classification:   Patient Stated ; Clinical Service:   Non-Specified ; Code:   ICD-10-CM ; Probability:   0 ; Diagnosis Code:   E78.5      HTN (hypertension)  Date:   12/28/2017 ; Diagnosis Type:   Discharge ; Confirmation:   Confirmed ; Clinical Dx:   HTN (hypertension) ; Classification:   Medical ; Clinical Service:   Non-Specified ; Code:   ICD-10-CM ; Probability:   0 ; Diagnosis Code:   I10      Hypertensive emergency  Date:   12/28/2017 ; Diagnosis Type:   Discharge ; Confirmation:   Confirmed ; Clinical Dx:   Hypertensive emergency ; Classification:   Medical ; Clinical Service:   Non-Specified ; Code:   ICD-10-CM ; Probability:   0 ; Diagnosis Code:   I16.1      No contraindication to deep vein thrombosis (DVT) prophylaxis  Date:   12/28/2017 ; Diagnosis Type:   Discharge ; Confirmation:   Confirmed ; Clinical Dx:   No contraindication to deep vein thrombosis (DVT) prophylaxis ; Classification:   Medical ; Clinical Service:   Non-Specified ; Code:   ICD-10-CM ; Probability:   0 ; Diagnosis Code:   Z78.9      Unsteadiness on feet  Date:   12/29/2017 ; Diagnosis Type:   Other ; Confirmation:   Differential ; Clinical Dx:   Unsteadiness on feet ; Classification:   Interdisciplinary ; Clinical Service:   Non-Specified ; Code:   ICD-10-CM ; Probability:   0 ; Diagnosis Code:   R26.81        Home Environment   Living Environment :   Home Environment  Living Situation:  Home with family support  Performed By:  Sebastian, RN, Camie 12/28/2017  Sensory  Deficits:  None  Performed By:  Sebastian, RN, Camie 12/28/2017     Living Situation :   Home with family support   Lives With :   Family   Lives In :   Single level home   [place], [place], PAULA - 12/29/2017 11:53 EDT   Stairs     Outside Stairs          Number of Stairs :    1                 HAMBLIN, PT, PAULA - 12/29/2017 11:53 EDT         Home Environment II   Living Environment :   Home Environment  Living Situation:  Home with family support  Performed By:  Sebastian, RN, Camie 12/28/2017  Sensory Deficits:  None  Performed By:  Sebastian, RN, Camie 12/28/2017     Devices/Equipment :   [place] Finder - Rolling   Additional Information :   pt's granddaughter resides with pt in single level home and has cane/rw but rarely uses them.   HAMBLIN, PT, PAULA - 12/29/2017 11:53 EDT   LE Range/Strength   LE Overall Range of Motion Grid   Left Lower Extremity Passive Range :   Within functional limits   Left Lower Extremity Active Range :   Within functional limits   Right Lower Extremity Passive Range :   Within functional limits   Right Lower Extremity Active Range :   Within functional limits   HAMBLIN, PT, PAULA - 12/29/2017 11:53 EDT   Lt Lower Extremity Strength :   Within functional limits   Rt Lower Extremity Strength :   Within functional limits   HAMBLIN, PT, PAULA - 12/29/2017 11:53 EDT   UE Strength/ROM   Upper Extremity Overall ROM Grid   Left Upper Extremity Passive Range :   Within functional limits   Left Upper Extremity Active Range :   Within functional limits   Right Upper Extremity Passive Range :   Within functional limits   Right Upper Extremity Active Range :   Within functional limits   HAMBLIN, PT, PAULA - 12/29/2017 11:53 EDT   Lt Upper Extremity Strength :   Within functional limits   Rt Upper Extremity Strength :   Within functional limits   HAMBLIN, PT, PAULA - 12/29/2017 11:53 EDT   Sensation   Left Lower Extremity Sensation   Light Touch :   Intact   HAMBLIN, PT, PAULA - 12/29/2017 11:53 EDT   Right Lower  Extremity Sensation   Light Touch :   Intact   HAMBLIN, PT, PAULA - 12/29/2017 11:53 EDT   Mobility   Mobility Grid   Roll Left :   Rehab Contact guard assistance   Roll Right :   Rehab Contact guard assistance   Supine to Sit :   Rehab Contact guard assistance   Sit to Supine :   Rehab Contact guard assistance   Transfer Sit to Stand :   Rehab Minimal assistance, Rehab Contact guard assistance   Transfer Stand to Sit :   Rehab Contact guard assistance, Rehab Minimal assistance   HAMBLIN, PT, PAULA - 12/29/2017 11:53 EDT   Ambulation Level Acute :   Minimal assistance   Ambulation Quality :   min<>cga c  mobility 2* dizziness; req'd +1 seated rest and BP's taken in  sitting (128/71; stand 101/66); performed ther ex x 10reps (AP,heel raises, LAQ's, knee flex<>ext and marching; shld shrugs);;  amb again c  rw x 75' +1 cga; amb x 10' to chair s AD   Ambulation Distance :   20 ft   Device :   Gait belt, Rolling walker   HAMBLIN, PT, PAULA - 12/29/2017 11:53 EDT   Balance   Balance Tests Performed :   Other: fair<>fair+ sitting balance with mild posterior lean; standing balance fair initial standing attempt and then fair+ with 2nd attempt.   HAMBLIN, PT, PAULA - 12/29/2017 11:53 EDT   Vital Signs   Additional Information :   supine 145/70; sitting EOB 128/71   Systolic/ Diastolic BP :   101 mmHg   Diastolic Blood Pressure with Activity :   66 mmHg   Additional Information :   initial standing   HAMBLIN, PT, PAULA - 12/29/2017 13:04 EDT   AM-PAC Basic Mobility   AM-PAC Basic Mobility   Turning from your back on your side while in a flat bed without using bedrails? :   None = 4 points   Moving from lying on your back to sitting on the side of a flat bed without using bedrails? :   A little = 3 points   Moving to and from a bed to a chair (including a wheelchair)? :   A little = 3 points   Standing up from a chair using your arms (e.g. wheelchair or bedside chair)? :   A little = 3 points   To walk in hospital room? :   A little  = 3 points   HAMBLIN, PT, PAULA - 12/29/2017 13:04 EDT   AM-PAC Mobility Raw Score :   16    HAMBLIN, PT, PAULA - 12/29/2017 13:04 EDT   Assessment   PT Impairments or Limitations :   Ambulation deficits, Balance deficits, Endurance deficits   Discharge Recommendations :   d/c recs pending mobility progress- pt prefers to go home with family support     PT Treatment Recommendations :   pt presents with impairments in functional mobility. gait instability and has posterior lean upon initial sitting EOB and in standing. Pt with dizziness with transitional mov'ts and will benefit from skilled PT intervention for balance and progressive amb with appropriate ass't device     HAMBLIN, PT, PAULA - 12/29/2017 13:04 EDT   Education   Responsible Learner Present for Session :   Yes   Home Caregiver Name/Relationship :   pt   Teaching Method :   Demonstration, Explanation   HAMBLIN, PT, PAULA - 12/29/2017 13:04 EDT   Physical Therapy Education Grid   Ambulation with Nurse, adult :   Needs practice/supervision   Bed Mobility :   Needs practice/supervision   Physical Therapy Plan of Care :   Needs practice/supervision   HAMBLIN, PT, PAULA - 12/29/2017 13:04 EDT   Long Term Goals   PT LT Goals Reviewed :   Yes   HAMBLIN, PT, PAULA - 12/29/2017 13:04 EDT   Outpatient PT Long Term Goals Rehab     Long Term Goal 1  Long Term Goal 2  Long Term Goal 3  Long Term Goal 4    Goal :    (I) with bed mobility sup<>sit EOB   sit<>stand +mod(I)   amb with LRAD +1 sba x 200' with good balance   sit<>stand x 5 reps +1 UE support     Time Frame :    6 days from 12/29/17              Status :  Initial              Comments :    to be met in 6 days                HAMBLIN, PT, PAULA - 12/29/2017 13:04 EDT  HAMBLIN, PT, PAULA - 12/29/2017 13:04 EDT  HAMBLIN, PT, PAULA - 12/29/2017 13:04 EDT  HAMBLIN, PT, PAULA - 12/29/2017 13:04 EDT      Short Term Goals   PT ST Goals Reviewed :   Yes   HAMBLIN, PT, PAULA - 12/29/2017 13:04 EDT   Other PT Goals Grid     Goal  #1  Goal #2  Goal #3  Goal #4    Goal :    +1 sba with sup<>sit EOB   sit<>stand +1 cga<>sba using rw   amb x 100' with rw +1 sba   sit<>stand with  good balance x 5 reps B UE support +1 cga     Time Frame :     3days from 12/29/17                HAMBLIN, PT, PAULA - 12/29/2017 13:04 EDT  HAMBLIN, PT, PAULA - 12/29/2017 13:04 EDT  HAMBLIN, PT, PAULA - 12/29/2017 13:04 EDT  HAMBLIN, PT, PAULA - 12/29/2017 13:04 EDT      Plan   PT Frequency Acute :   Daily   Duration :   30    PT Duration Unit Rehab :   Days   Treatments Planned :   Balance training, Functional training, Gait training, Patient education, Stair training, Therapeutic activities, Therapeutic exercises   Treatment Plan/Goals Established With Patient/Caregiver :   Yes   Other PT Treatment Provided :   follow daily x 5-7days/wk   Evaluation Complete :   Yes   HAMBLIN, PT, PAULA - 12/29/2017 13:04 EDT   Time Spent With Patient   PT Evaluation Units, Low Complexity :   1 Unit   PT Individual Eval Time, Low Complexity :   15 minutes   PT Therapeutic Exercise Units :   1 units   PT Therapeutic Exercise Time :   8 minutes   PT Therapeutic Activity Units :   1 units   PT Therapeutic Activity Time :   15 minutes   PT Total Individual Min :   15    PT Treatment Time Comment :   PT eval, bed mobility, transfers, sitting ther ex, amb with rwx2 and then amb from s AD. rtn to sitting in recliner with chair TABS . Nsg updated. all needs in reach   PT Total Timed Code Treatment Units :   2 units   PT Total Timed Code Min :   23    PT Total Untimed Min Ac/Outp :   15    PT Total Treatment Time Ac/Outp :   38    HAMBLIN, PT, PAULA - 12/29/2017 13:04 EDT

## 2017-12-29 NOTE — Case Communication (Signed)
CM Discharge Planning Assessment - Text       CM Discharge Planning Assessment Entered On:  12/29/2017 15:03 EDT    Performed On:  12/29/2017 15:00 EDT by Coralee PesaPATTERSON,  [name]               Home Environment   Living Environment :   Living Situation: Home with family support  Current Home Treatments:   Home Devices/Equipment   Professional Skilled Services:   Therapist, sportspecial Services and Community Resources:   Sensory Deficits:   Performed byNicholes Rough: HAMBLIN, PT, PAULA-12/29/17 11:53:00       Affect/Behavior :   Appropriate, Cooperative   Lives With :   Family   Lives In :   Single level home   JenisonPATTERSON,  [place] R - 12/29/2017 15:00 EDT   Rehabilitation Stairs Grid     Outside Stairs          Number of Stairs :    1                 Coralee PesaTERSON,  [name] - 12/29/2017 15:00 EDT         Living Situation :   Home with family support   Outside Facility Information :   Dr. Susann GivensFranklin   Barriers at Home :   None   PATTERSON,  [name] - 12/29/2017 15:00 EDT   Recent Hospitalization Grid     Hospitalization #1          Date :    March 2019              Reason :    weakness, syncope              Comment  (Comment: [facility] [PATTERSON,  [name] - 12/29/2017 15:00 EDT] )         Coralee PesaTERSON,  [name] - 12/29/2017 15:00 EDT         Home Environment   Home Equipment Rehab :   Ephraim HamburgerCane, Walker - Rolling   ADLs Prior to Admission :   Independent   Sensory Deficits :   None   PATTERSON,  [name] - 12/29/2017 15:00 EDT   Discharge Needs I   Previously Documented Discharge Needs :   DISCHARGE PLAN/NEEDS:  EQUIPMENT/TREATMENT NEEDS:       Previously Documented Benefits Information :   No discharge data available.     Anticipated Discharge Date :   12/29/2017 EDT   Anticipated Discharge Time Slot :   1200-1400   Discharge To :   Home with family support   CM Progress Note :   9/17 RRP 84 yo DNR admitted for HTN emergency.  She lives with her granddaughter, I with ADLs, has a cane and walker but rarely uses.  She was here in march for same c/o and dc to [facility] for  rehab.  She has PCP Dr. Susann GivensFranklin.  [place] and asked for home companionship info.  A place for mom flyer provided and sitter list with financial assistance info given.  She was changed to OBS this afternoon so MOON notice reviewed and signed.     Coralee PesaTERSON,  [name] - 12/29/2017 15:00 EDT   Discharge Needs II   Professional Skilled Services :   No Needs   Needs Assistance with Transportation :   No   Discharge Transportation Arranged :   N/A   Needs Assistance at Home Upon Discharge :  No   Requires Caregiver Involvement :   No   Discharge Planning Time Spent :   20 minutes   PATTERSON,  [name] - 12/29/2017 15:00 EDT   Benefits   Insurance Information :   Medicare, Private Insurance   [place],  [place] R - 12/29/2017 15:00 EDT   Advance Directive   Advance Directive :   Yes   Type of Advance Directive :   Living will   Location of Advance Directive :   Unable to obtain copy   Intent of Advance Directive Stated By :   Relative   Patient Wishes to Receive Further Information on Advance Directives :   No   Coralee Pesa R - 12/29/2017 15:00 EDT   Discharge Planning   Discharge Arrangements :       Patient Post-Acute Information    Patient Name: VALINA, MAES  MRN: [id number]  FIN: [id number]  Gender: Female  DOB: 01/27/2029  Age:  84 Years        *** No Post-Acute Placement(s) Listed ***          *** No Post-Acute Service(s) Listed ***       Interventions Performed :   All resolved   Discharge Plan Discussion :   Family/Caregiver, Discussed with patient, Patient agrees with plan, Family agrees with plan   Barriers to Discharge Identified :   None identified   Idamae Schuller - 12/29/2017 15:00 EDT   Discharge Planning Assessment Status   Discharge Planning Assessment Complete :   Yes   PATTERSON,  [name] - 12/29/2017 15:00 EDT

## 2017-12-29 NOTE — Progress Notes (Signed)
Chaplaincy Note - Text       Chaplaincy Note Entered On:  12/29/2017 13:34 EDT    Performed On:  12/29/2017 13:32 EDT by Elmer SowILEY,  ROSALIND R               Chaplaincy Consult   Faith/Denomination :   Ephri[employer] Knuck[employer]ristian   Importance of Faith :   Very important   Religious Spiritual Resources :   Spiritual well being   Religious Spiritual Practices :   Prayer   Religious Family Issues :   Daughter present   Additional Information, Chaplaincy :   I am familiar with pt from previous visit.  Pt's has strong faith foundation and trust in God.  Very pleasant individual.  I reassured pt of pastoral presence and support, offered prayer, and left contact card.   Elmer SowRILEY,  ROSALIND R - 12/29/2017 13:32 EDT

## 2017-12-29 NOTE — Nursing Note (Signed)
Nursing Discharge Summary - Text       Nursing Discharge Summary Entered On:  12/29/2017 17:32 EDT    Performed On:  12/29/2017 17:22 EDT by Rudie MeyerKURTZ, RN, Theodosia QuayAROLINE W               DC Information   Discharge To, Anticipated :   Home with family support   Transportation :   Private vehicle   Accompanied By :   Daughter   Mode of Discharge :   Wheelchair   KURTZ, RN, Theodosia QuayCAROLINE W - 12/29/2017 17:22 EDT   Education   Responsible Learner(s) :   Living Situation: Home with family support        Performed by: Idamae SchullerPATTERSON,  [name] - 12/29/2017 15:00  Discharge To: Home with family support        Performed by: Coralee PesaPATTERSON,  [name] - 12/29/2017 15:00  Home Caregiver Name/Relationship: pt        Performed by: Nicholes RoughHAMBLIN, PT, PAULA - 12/29/2017 11:53     Home Caregiver Present for Session :   Yes   Barriers To Learning :   Desire/Motivation   Teaching Method :   Explanation, Printed materials   [place]UR[place], RN, Theodosia QuayCAROLINE W - 12/29/2017 17:22 EDT   Post-Hospital Education Adult Grid   Activity Expectations :   Bristol-Myers SquibbVerbalizes understanding   Community Resources :   Bristol-Myers SquibbVerbalizes understanding   Disease Process :   Verbalizes understanding   Importance of Follow-Up Visits :   TEFL teacherVerbalizes understanding   Physical Limitations :   Verbalizes understanding   Plan of Care :   Verbalizes understanding   When to Call Health Care Provider :   Bristol-Myers SquibbVerbalizes understanding   KURTZ, RN, Theodosia QuayCAROLINE W - 12/29/2017 17:22 EDT   Medication Education Adult Grid   Med Dosage, Route, Scheduling :   TEFL teacherVerbalizes understanding   Med Generic/Brand Name, Purpose, Action :   Verbalizes understanding   Med Preadministration Procedures :   [employer] CorningVerbalizes understanding   KURTZ, RN, Theodosia QuayCAROLINE W - 12/29/2017 17:22 EDT   Additional Learner(s) Present :   Daughter   Time Spent Educating Patient :   15 minutes   KURTZ, RN, Theodosia QuayCAROLINE W - 12/29/2017 17:22 EDT

## 2017-12-29 NOTE — Progress Notes (Signed)
Anticoagulation Monitoring - Text       Pharmacy Anticoagulation Monitoring Entered On:  12/29/2017 14:35 EDT    Performed On:  12/29/2017 14:34 EDT by Barrett HenleBAIRD,  MELISSA A               Anticoagulation Monitoring Chart   Indication for Treatment :   VTE prophylaxis   BAIRD,  MELISSA A - 12/29/2017 14:34 EDT   Date :    12/29/2017 EDT              Medication(s) :    enox              Current Dose :    30mg  q24h              Hgb/Hct :    13.2              CrCl :    60               Notes :    p 159 wt 60kg                BAIRD,  MELISSA A - 12/29/2017 14:34 EDT

## 2017-12-29 NOTE — Nursing Note (Signed)
Medication Administration Follow Up-Text       Medication Administration Follow Up Entered On:  12/29/2017 1:45 EDT    Performed On:  12/29/2017 1:43 EDT by Lewis-Coker, RN, Lind CovertLaToya S      Intervention Information:     hydrALAZINE  Performed by Lewis-Coker, RN, LaToya S on 12/29/2017 00:55:00 EDT       hydrALAZINE,10mg   IV Push,Antecubital, Right,hypertension       Med Response   ED Medication Response :   No adverse reaction, Symptoms improved, Continue to observe for symptoms, Other: bp 121/67 MAP 92   [place]ero Opioid Induced Sedation Scale :   1 = Awake and alert   Lewis-Coker, RN, Lind CovertLaToya S - 12/29/2017 1:43 EDT

## 2017-12-29 NOTE — Progress Notes (Signed)
Anticoagulation Monitoring - Text       Pharmacy Anticoagulation Monitoring Entered On:  12/29/2017 14:41 EDT    Performed On:  12/29/2017 14:41 EDT by Barrett HenleBAIRD,  MELISSA A               Anticoagulation Monitoring Chart   Indication for Treatment :   VTE prophylaxis   BAIRD,  MELISSA A - 12/29/2017 14:41 EDT   Date :    12/29/2017 EDT              Medication(s) :    enox              Current Dose :    30mg  q24h              Hgb/Hct :    13.2              CrCl :    50               Notes :    p 159 wt 59.1kg scr .6 rounded to .7 for crcl, 84 yo                BAIRD,  MELISSA A - 12/29/2017 14:41 EDT

## 2017-12-29 NOTE — Progress Notes (Signed)
Anticoagulation Monitoring - Text       Pharmacy Anticoagulation Monitoring Entered On:  12/29/2017 14:44 EDT    Performed On:  12/29/2017 14:43 EDT by Barrett HenleBAIRD,  MELISSA A               Anticoagulation Monitoring Chart   Indication for Treatment :   VTE prophylaxis   BAIRD,  MELISSA A - 12/29/2017 14:43 EDT   Date :    12/29/2017 EDT              Medication(s) :    enox              Current Dose :    30mg  q24h              Hgb/Hct :    13.2              CrCl :    50               Notes :    p 159 wt 59.1kg scr .6 rounded to .7 for crcl, 84 yo  dishcarge today or in am.  adjust enox 9/18 to 40 if still here.               Initials :    mb                BAIRD,  MELISSA A - 12/29/2017 14:43 EDT

## 2017-12-29 NOTE — Progress Notes (Signed)
Anticoagulation Monitoring - Text       Pharmacy Anticoagulation Monitoring Entered On:  12/29/2017 14:38 EDT    Performed On:  12/29/2017 14:38 EDT by Barrett HenleBAIRD,  MELISSA A               Anticoagulation Monitoring Chart   Indication for Treatment :   VTE prophylaxis   BAIRD,  MELISSA A - 12/29/2017 14:38 EDT   Date :    12/29/2017 EDT              Medication(s) :    enox              Current Dose :    30mg  q24h              Hgb/Hct :    13.2              CrCl :    50               Notes :    p 159 wt 59.1kg scr .6 rounded to .7 for crcl, 84 yo                 BAIRD,  MELISSA A - 12/29/2017 14:38 EDT

## 2017-12-29 NOTE — Discharge Summary (Signed)
 Inpatient Patient Summary               [facility]  [address]  [place], [place] [address]  [phone]  Patient Discharge Instructions     Name: Allison [name], Allison [name]  Current Date: 12/29/2017 14:11:56  DOB: Jun 22, 1928 MRN: [id number] FIN: [phone]  Patient Address: [address] [place] [address]  Patient Phone: [phone]  Primary Care Provider:  Name: Allison [name]  Phone: [phone]   Immunizations Provided:       Discharge Diagnosis: 1:Hypertensive emergency; 3:HTN (hypertension); 4:HLD (hyperlipidemia); 5:Diastolic CHF, chronic; 6:Chronic GERD; 7:No contraindication to deep vein thrombosis (DVT) prophylaxis  Discharged To: TO, ANTICIPATED%>Home with family support  Home Treatments: TREATMENTS, ANTICIPATED%>  Devices/Equipment: EQUIPMENT REHAB%>Cane, Walker - [employer]: HOSPITAL SERVICES%>  Professional Skilled Services: SKILLED SERVICES%>  Therapist, sports and Community Resources:               SERV AND COMM RES, ANTICIPATED%>  Mode of Discharge Transportation: TRANSPORTATION%>  Discharge Orders          Discharge Patient 12/29/17 13:55:00 EDT, Discharge Home/Home Health         Comment:      Medications   During the course of your visit, your medication list was updated with the most current information. The details of those changes are reflected below:          Medications That Were Updated - Follow Current Instructions  [facility] [phone], [address] Allison [name], [place] [phone], [phone]  Current lisinopril  (lisinopril  20 mg oral tablet) 1 Tabs Oral (given by mouth) every day. Refills: 0.  Last Dose:____________________    Medications that have not changed  Other Medications  amLODIPine  (Norvasc  10 mg oral tablet) 1 Tabs Oral (given by mouth) every day.  Last Dose:____________________  aspirin  (aspirin  81 mg oral tablet) 1 Tabs Oral (given by mouth) every day.  Last Dose:____________________  atorvastatin (atorvastatin 40  mg oral tablet) 1 Tabs Oral (given by mouth) every day.  Last Dose:____________________  furosemide  (Lasix ) 10 Milligram every day.  Last Dose:____________________  multivitamin with minerals (One-A-Day Women 50 Plus oral tablet) 1 Tabs Oral (given by mouth) every day.  Last Dose:____________________  No Longer Take the Following Medications  acetaminophen  (acetaminophen  325 mg oral tablet) 2 Tabs Oral (given by mouth) every 4 hours as needed mild pain (1-3).  Stop Taking Reason: CV Entry Error         [facility] would like to thank you for allowing us  to assist you with your healthcare needs. The following includes patient education materials and information regarding your injury/illness.     [name], [name] has been given the following list of follow-up instructions, prescriptions, and patient education materials:  Follow-up Instructions:              With: Address: When:   [facility] [address] [place], [place] [address]  [phone] Business (1) Within 1 to 2 weeks                    It is important to always keep an active list of medications available so that you can share with other providers and manage your medications appropriately. As an additional courtesy, we are also providing you with your final active medications list that you can keep with you.            amLODIPine  (  Norvasc  10 mg oral tablet) 1 Tabs Oral (given by mouth) every day.  aspirin  (aspirin  81 mg oral tablet) 1 Tabs Oral (given by mouth) every day.  atorvastatin (atorvastatin 40 mg oral tablet) 1 Tabs Oral (given by mouth) every day.  furosemide  (Lasix ) 10 Milligram every day.  lisinopril  (lisinopril  20 mg oral tablet) 1 Tabs Oral (given by mouth) every day. Refills: 0.  multivitamin with minerals (One-A-Day Women 50 Plus oral tablet) 1 Tabs Oral (given by mouth) every day.      Take only the medications listed above. Contact your doctor prior to taking any medications not on this list.        Discharge  instructions, if any, will display below     Instructions for Diet: INSTRUCTIONS FOR DIET%>   Instructions for Supplements: SUPPLEMENT INSTRUCTIONS%>   Instructions for Activity: INSTRUCTIONS FOR ACTIVITY%>   Instructions for Wound Care: INSTRUCTIONS FOR WOUND CARE%>     Medication leaflets, if any, will display below     Patient education materials, if any, will display below              Preventing [name] in the Home  An adult or child can fall for many reasons. If you are an older adult, you may fall because your reaction time slows down. Your muscles and joints may get stiff, weak, or less flexible because of illness, medicines, or a physical condition. These things can also make a child more likely to fall or be injured in a fall.  Other health problems that make [name] more likely include:   Arthritis   Dizziness or lightheadedness when you get out of bed (orthostatic hypotension)   History of a stroke   Dizziness   Anemia   Certain medicines taken for mental illness   Problems with balance or gait   History of [name] with or without an injury   Changes in vision (vision impairment)   Changes in thinking skills and memory (cognitive impairment)  Injuries from a fall can include broken bones, dislocated joints, and cuts. When these injuries are serious enough, they can make it impossible for you or a child who is injured in a fall to live on his or her own.  Prevention tips  To help prevent [name] and fall-related injuries, follow the tips below.   Assistive Devices       Please use your cane and/or walker!   Floors  Make floors safer by doing the following:    Put nonskid pads under area rugs.   Remove throw rugs.   Replace worn floor coverings.   Tack carpets firmly to each step on carpeted stairs. Put nonskid strips on the edges of uncarpeted stairs.   Keep floors and stairs free of clutter and cords.   Arrange furniture so there are clear pathways.   Clean up any spills right away.   Wear  shoes that fit.  Bathrooms    Make bathrooms safer by doing the following:    Install grab bars in the tub or shower.   Apply nonskid strips or put a nonskid rubber mat in the tub or shower.   Sit on a bath chair to bathe.   Use bathmats with nonskid backing.  Lighting and the environment  Improve lighting in your home by doing the following:    Keep a flashlight in each room. Or put a lamp next to the bed within easy reach.   Put nightlights in the bedrooms, hallways, kitchen, and bathrooms.  Make sure all stairways have good lighting.   Take your time when going up and down stairs.   Put handrails on both sides of stairs and in walkways for more support. To prevent injury to your wrist or arm, don't use handrails to pull yourself up.   Install grab bars to pull yourself up.   Move or rearrange items that you use often. This will make them easier to find or reach.   Look at your home to find any safety hazards. Especially look at doorways, walkways, and the driveway. Remove or repair any safety problems that you find.     2000-2017 [employer] [address] [place], [place] [address]. All rights reserved. This information is not intended as a substitute for professional medical care. Always follow your healthcare professional's instructions.             IS IT A STROKE? Act FAST and Check for these signs:    FACE                         Does the face look uneven?    ARM                         Does one arm drift down?    SPEECH                    Does their speech sound strange?    TIME                         Call 9-1-1 at any sign of stroke  ---------------------------------------------------------------------------------------------------------------------------  Heart Attack Signs  Chest discomfort: Most heart attacks involve discomfort in the center of the chest and lasts more than a few minutes, or goes away and comes back. It can feel like uncomfortable pressure, squeezing,  fullness or pain.  Discomfort in upper body: Symptoms can include pain or discomfort in one or both arms, back, neck, jaw or stomach.  Shortness of breath: With or without discomfort.  Other signs: Breaking out in a cold sweat, nausea, or lightheaded.  Remember, MINUTES DO MATTER. If you experience any of these heart attack warning signs, call 9-1-1 to get immediate medical attention!     ---------------------------------------------------------------------------------------------------------------------------  [facility] allows you to manage your health, view your test results, and retrieve your discharge documents from your hospital stay securely and conveniently from your computer.  To begin the enrollment process, visit [email or web address] Click on "Sign up now" under [facility]

## 2017-12-29 NOTE — Discharge Summary (Signed)
 Inpatient Clinical Summary             [facility]  Post-Acute Care Transfer Instructions  PERSON INFORMATION   Name: Allison [name], Allison [name]  MRN: [id number]    FIN#: [id number]   PHYSICIANS  Admitting Physician: DARIO RICARDO BONNET  Attending Physician: DARIO RICARDO BONNET   PCP: LORENZO MAGDALENA CROME  Discharge Diagnosis:  1:Hypertensive emergency; 3:HTN (hypertension); 4:HLD (hyperlipidemia); 5:Diastolic CHF, chronic; 6:Chronic GERD; 7:No contraindication to deep vein thrombosis (DVT) prophylaxis  Comment:       PATIENT EDUCATION INFORMATION  Instructions:             Preventing Falls in the Home  Medication Leaflets:               Follow-up:                           With: Address: When:   [facility] [address] [place], [place] [address]  [phone] Business (1) Within 1 to 2 weeks                             MEDICATION LIST  Medication Reconciliation at Discharge:          Medications That Were Updated - Follow Current Instructions  [facility] [phone], [address] [place], [place] [id number], [phone]  Current lisinopril  (lisinopril  20 mg oral tablet) 1 Tabs Oral (given by mouth) every day. Refills: 0.  Last Dose:____________________    Medications that have not changed  Other Medications  amLODIPine  (Norvasc  10 mg oral tablet) 1 Tabs Oral (given by mouth) every day.  Last Dose:____________________  aspirin  (aspirin  81 mg oral tablet) 1 Tabs Oral (given by mouth) every day.  Last Dose:____________________  atorvastatin (atorvastatin 40 mg oral tablet) 1 Tabs Oral (given by mouth) every day.  Last Dose:____________________  furosemide  (Lasix ) 10 Milligram every day.  Last Dose:____________________  multivitamin with minerals (One-A-Day Women 50 Plus oral tablet) 1 Tabs Oral (given by mouth) every day.  Last Dose:____________________  No Longer Take the Following Medications  acetaminophen  (acetaminophen  325 mg oral tablet) 2 Tabs Oral (given by mouth)  every 4 hours as needed mild pain (1-3).  Stop Taking Reason: CV Entry Error         Patient's Final Home Medication List Upon Discharge:           amLODIPine  (Norvasc  10 mg oral tablet) 1 Tabs Oral (given by mouth) every day.  aspirin  (aspirin  81 mg oral tablet) 1 Tabs Oral (given by mouth) every day.  atorvastatin (atorvastatin 40 mg oral tablet) 1 Tabs Oral (given by mouth) every day.  furosemide  (Lasix ) 10 Milligram every day.  lisinopril  (lisinopril  20 mg oral tablet) 1 Tabs Oral (given by mouth) every day. Refills: 0.  multivitamin with minerals (One-A-Day Women 50 Plus oral tablet) 1 Tabs Oral (given by mouth) every day.         Comment:       ORDERS          Order Name Order Details   Discharge Patient 12/29/17 13:55:00 EDT, Discharge Home/Home Health

## 2017-12-29 NOTE — Progress Notes (Signed)
Inpatient OT Evaluation - Text       Inpatient OT Evaluation Entered On:  12/29/2017 14:12 EDT    Performed On:  12/29/2017 14:05 EDT by AMMONS, OT, STEPHEN               Reason for Treatment   Subjective Statement :   Evaluation complete     *Reason for Referral :   Dizziness     *Chief Complaint :   Weakness     AMMONS, OT, STEPHEN - 12/29/2017 14:05 EDT   General Information   Occupational Therapy Orders :   Occupational Therapy Evaluation and Treatment Inpatient Acute - 06/25/17 1:11:00 EDT, Stop date 06/25/17 1:11:00 EDT     Precautions RTF :   Precaution Orders   No qualifying data available.     Pain Present :   No actual or suspected pain   Orientation Assessment :   Oriented x 4   Affect/Behavior :   Appropriate, Cooperative   AMMONS, OT, STEPHEN - 12/29/2017 14:05 EDT   Problem List   (As Of: 12/29/2017 14:12:59 EDT)   Problems(Active)    At risk for falls (SNOMED CT  :[id number] )  Name of Problem:   At risk for falls ; Onset Date:   06/25/2017 ; Recorder:   SYSTEM,  SYSTEM; Confirmation:   Confirmed ; Classification:   Interdisciplinary ; Code:   [id number] ; Last Updated:   06/25/2017 2:38 EDT ; Life Cycle Date:   06/25/2017 ; Life Cycle Status:   Active ; Vocabulary:   SNOMED CT   ; Comments:        06/25/2017 2:38 - SYSTEM,  SYSTEM  Problem added by Discern Expert      HLD (hyperlipidemia) (SNOMED CT  :84696295 )  Name of Problem:   HLD (hyperlipidemia) ; Recorder:   BELLEW, RN, KRISTY M; Confirmation:   Confirmed ; Classification:   Patient Stated ; Code:   [id number] ; Contributor System:   PowerChart ; Last Updated:   06/24/2017 16:16 EDT ; Life Cycle Date:   06/24/2017 ; Life Cycle Status:   Active ; Vocabulary:   SNOMED CT        HTN (hypertension) (SNOMED CT  :[phone] )  Name of Problem:   HTN (hypertension) ; Recorder:   Daphine Deutscher, RN, Apolinar Junes; Confirmation:   Confirmed ; Classification:   Medical ; Code:   [phone] ; Contributor System:   PowerChart ;  Last Updated:   10/28/2015 8:22 EDT ; Life Cycle Date:   10/28/2015 ; Life Cycle Status:   Active ; Vocabulary:   SNOMED CT        No contraindication to deep vein thrombosis (DVT) prophylaxis (SNOMED CT  :706237 )  Name of Problem:   No contraindication to deep vein thrombosis (DVT) prophylaxis ; Recorder:   [name], MD, Madlyn Frankel; Confirmation:   Confirmed ; Classification:   Medical ; Code:   [id number] ; Contributor System:   PowerChart ; Last Updated:   12/28/2017 18:29 EDT ; Life Cycle Status:   Active ; Responsible Provider:   [name], MD, Madlyn Frankel; Vocabulary:   SNOMED CT          Diagnoses(Active)    Chronic GERD  Date:   12/28/2017 ; Diagnosis Type:   Discharge ; Confirmation:   Confirmed ; Clinical Dx:   Chronic GERD ; Classification:   Medical ; Clinical Service:   Non-Specified ; Code:   ICD-10-CM ; Probability:   0 ; Diagnosis Code:  K21.9      Diastolic CHF, chronic  Date:   12/28/2017 ; Diagnosis Type:   Discharge ; Confirmation:   Confirmed ; Clinical Dx:   Diastolic CHF, chronic ; Classification:   Medical ; Clinical Service:   Non-Specified ; Code:   ICD-10-CM ; Probability:   0 ; Diagnosis Code:   I50.32      Dizziness  Date:   12/28/2017 ; Diagnosis Type:   Reason For Visit ; Confirmation:   Complaint of ; Clinical Dx:   Dizziness ; Classification:   Medical ; Clinical Service:   Emergency medicine ; Code:   PNED ; Probability:   0 ; Diagnosis Code:   [id number]      HLD (hyperlipidemia)  Date:   12/28/2017 ; Diagnosis Type:   Discharge ; Confirmation:   Confirmed ; Clinical Dx:   HLD (hyperlipidemia) ; Classification:   Patient Stated ; Clinical Service:   Non-Specified ; Code:   ICD-10-CM ; Probability:   0 ; Diagnosis Code:   E78.5      HTN (hypertension)  Date:   12/28/2017 ; Diagnosis Type:   Discharge ; Confirmation:   Confirmed ; Clinical Dx:   HTN (hypertension) ; Classification:   Medical ; Clinical Service:   Non-Specified ; Code:   ICD-10-CM ; Probability:   0 ;  Diagnosis Code:   I10      Hypertensive emergency  Date:   12/28/2017 ; Diagnosis Type:   Discharge ; Confirmation:   Confirmed ; Clinical Dx:   Hypertensive emergency ; Classification:   Medical ; Clinical Service:   Non-Specified ; Code:   ICD-10-CM ; Probability:   0 ; Diagnosis Code:   I16.1      No contraindication to deep vein thrombosis (DVT) prophylaxis  Date:   12/28/2017 ; Diagnosis Type:   Discharge ; Confirmation:   Confirmed ; Clinical Dx:   No contraindication to deep vein thrombosis (DVT) prophylaxis ; Classification:   Medical ; Clinical Service:   Non-Specified ; Code:   ICD-10-CM ; Probability:   0 ; Diagnosis Code:   Z78.9      Unsteadiness on feet  Date:   12/29/2017 ; Diagnosis Type:   Other ; Confirmation:   Differential ; Clinical Dx:   Unsteadiness on feet ; Classification:   Interdisciplinary ; Clinical Service:   Non-Specified ; Code:   ICD-10-CM ; Probability:   0 ; Diagnosis Code:   R26.81        OT Basic ADL   Basic ADL Grid   Eating :   Complete independence   Grooming :   Complete independence   Bathing :   Contact guard assistance   UE Dressing :   Complete independence   LE Dressing :   Contact guard assistance   AMMONS, OT, STEPHEN - 12/29/2017 14:05 EDT   Limiting Factors :   Motor   AMMONS, OT, STEPHEN - 12/29/2017 14:05 EDT   UE Strength/ROM   Upper Extremity Overall ROM Grid   Left Upper Extremity Passive Range :   Within functional limits   Left Upper Extremity Active Range :   Within functional limits   Right Upper Extremity Passive Range :   Within functional limits   Right Upper Extremity Active Range :   Within functional limits   AMMONS, OT, STEPHEN - 12/29/2017 14:05 EDT   Lt Upper Extremity Strength :   Within functional limits   Rt Upper Extremity Strength :   Within functional  limits   AMMONS, OT, STEPHEN - 12/29/2017 14:05 EDT   Long Term Goals   OT LT Goals Reviewed :   Yes   AMMONS, OT, STEPHEN - 12/29/2017 14:05 EDT   Outpatient OT Long Term Goals Rehab     Long Term Goal  1  Long Term Goal 2  Long Term Goal 3      Goal :    Maximize Indep with UE/LE ADLs   Maximize Indep with toilet t/f   Maximize B UE strength/endurance          AMMONS, OT, STEPHEN - 12/29/2017 14:05 EDT  AMMONS, OT, STEPHEN - 12/29/2017 14:05 EDT  AMMONS, OT, STEPHEN - 12/29/2017 14:05 EDT       Short Term Goals   OT ST Goals Reviewed :   Yes   AMMONS, OT, STEPHEN - 12/29/2017 14:05 EDT   Plan   OT Evaluation Date :   12/29/2017 EDT   OT Frequency Acute :   Mo/Tu/We/Th/Fr   Duration :   7    Duration Unit :   Days   Estimated Hours Per Day :   Other: 15 mins per day   Planned Treatments :   Basic Activities of Daily Living, Mobility training, Patient education, Safety education, Therapeutic activities, Therapeutic exercises, Therapeutic exercises for strengthening and ROM   Treatment Plan/Goals Established With Patient/Caregiver :   Yes   OT Evaluation Complete :   Yes   AMMONS, OT, STEPHEN - 12/29/2017 14:05 EDT   Time Spent With Patient   OT Evaluation Units, Low Complexity :   1 Unit   OT Time In :   13:55 EST   OT Time Out :   14:05 EST   OT Individual Eval Time, Low Complexity :   10 minutes   OT Total Individual Therapy Time :   10 minutes   OT Total Untimed Code Treatment Minutes :   10 minutes   OT Total Treatment Time :   10 minutes   AMMONS, OT, STEPHEN - 12/29/2017 14:05 EDT   AM-PAC Daily Activity   AM-PAC Daily Activity   Putting on and taking off regular lower body clothing? :   A little = 3 points   Bathing (including washing, rinsing, drying)? :   A little = 3 points   Toileting , which includes using toilet, bedpan or urinal? :   A little = 3 points   Putting on and taking off regular upper body clothing? :   None = 4 points   Taking care of personal grooming such as brushing teeth? :   None = 4 points   Eating meals? :   None = 4 points   AMMONS, OT, STEPHEN - 12/29/2017 14:05 EDT   AM-PAC Daily Activity Raw Score :   21    AMMONS, OT, STEPHEN - 12/29/2017 14:05 EDT   Assessment   OT Impairments or  Limitations :   Basic activity of daily living deficits, Mobility deficits, Strength deficits   OT Discharge Recommendations :   Pt appropriate to d/c home with family supervision.     OT Treatment Recommendations :   Pt appears to functioning at her baseline.  Pt dtr asking about companion/caregiver services.  RN adviced and stated she would contact case management.     AMMONS, OT, STEPHEN - 12/29/2017 14:05 EDT

## 2020-01-17 NOTE — ED Notes (Signed)
 ED Patient Education Note     Patient Education Materials Follows:  Musculoskeletal     How to Use a Sling    A sling is a type of hanging bandage. You wear it around your neck to protect an injured arm, shoulder, or other body part. You may need to wear a sling to keep you from moving the injured body part while it heals. Keeping the injured part of your body still reduces pain and speeds up healing. Your doctor may suggest you use a sling if you have:       A broken arm.     A broken collarbone.     A shoulder injury.     Surgery.    RISKS AND COMPLICATIONS    Wearing a sling the wrong way can:     Make your injury worse.     Cause stiffness or numbness.     Affect blood circulation in your arm and hand. This can cause tingling or numbness in your fingers or hands.    HOW TO USE A SLING    The way that you should use a sling depends on your injury. It is important that you follow all of your doctor's instructions for your injury. Also follow these general suggestions:     Wear the sling so that your arm bends 90 degrees at the elbow. That is like a right angle or the shape of a capital letter L. The sling should also support your wrist and hand.     Try not to move your arm.     Do not lie down flat on your back while you have to wear a sling. Sleep in a recliner or use pillows to raise your upper body in bed.     Do not twist, raise, or move your arm in a way that could make your injury worse.     Do not lean on your arm while you have to wear a sling.     Do not lift anything while you have to wear a sling.    GET HELP IF:     You have bruising, swelling, or pain that is getting worse.     Your pain medicine is not helping.     You have a fever.    GET HELP RIGHT AWAY IF:     Your fingers are numb or tingling.     Your fingers turn blue or feel cold to the touch.     You cannot control the bleeding from your injury.     You are short of breath.    This information is not intended to replace advice given to you  by your health care provider. Make sure you discuss any questions you have with your health care provider.    Document Released: 06/25/2009 Document Revised: 04/21/2014 Document Reviewed: 02/01/2014  Elsevier Interactive Patient Education ?2016 Elsevier Inc.         Clavicle Fracture    The clavicle, also called the collarbone, is the long bone that connects your shoulder to your rib cage. You can feel your collarbone at the top of your shoulders and rib cage. A clavicle fracture is a broken clavicle. It is a common injury that can happen at any age.       CAUSES    Common causes of a clavicle fracture include:     A direct blow to your shoulder.     A car accident.  A fall, especially if you try to break your fall with an outstretched arm.     RISK FACTORS    You may be at increased risk if:     You are younger than 25 years or older than 75 years. Most clavicle fractures happen to people who are younger than 25 years.     You are a female.      You play contact sports.    SIGNS AND SYMPTOMS    A fractured clavicle is painful. It also makes it hard to move your arm. Other signs and symptoms may include:     A shoulder that drops downward and forward.      Pain when trying to lift your shoulder.     Bruising, swelling, and tenderness over your clavicle.     A grinding noise when you try to move your shoulder.      A bump over your clavicle.    DIAGNOSIS    Your health care provider can usually diagnose a clavicle fracture by asking about your injury and examining your shoulder and clavicle. He or she may take an X-ray to determine the position of your clavicle.    TREATMENT    Treatment depends on the position of your clavicle after the fracture:     If the broken ends of the bone are not out of place, your health care provider may put your arm in a sling or wrap a support bandage around your chest (figure-of-eight wrap).     If the broken ends of the bone are out of place, you may need surgery. Surgery may involve  placing screws, pins, or plates to keep your clavicle stable while it heals. Healing may take about 3 months.     When your health care provider thinks your fracture has healed enough, you may have to do physical therapy to regain normal movement and build up your arm strength.    HOME CARE INSTRUCTIONS     Apply ice to the injured area:    ? Put ice in a plastic bag.    ? Place a towel between your skin and the bag.    ? Leave the ice on for 20 minutes, 2?3 times a day.     If you have a wrap or splint:    ? Wear it all the time, and remove it only to take a bath or shower.    ? When you bathe or shower, keep your shoulder in the same position as when the sling or wrap is on.     ? Do not lift your arm.     If you have a figure-of-eight wrap:    ? Another person must tighten it every day.     ? It should be tight enough to hold your shoulders back.    ? Allow enough room to place your index finger between your body and the strap.    ? Loosen the wrap immediately if you feel numbness or tingling in your hands.     Only take medicines as directed by your health care provider.     Avoid activities that make the injury or pain worse for 4?6 weeks after surgery.     Keep all follow-up appointments.    SEEK MEDICAL CARE IF:    Your medicine is not helping to relieve pain and swelling.    SEEK IMMEDIATE MEDICAL CARE IF:    Your arm is numb, cold, or pale, even  when the splint is loose.    MAKE SURE YOU:     Understand these instructions.      Will watch your condition.     Will get help right away if you are not doing well or get worse.    This information is not intended to replace advice given to you by your health care provider. Make sure you discuss any questions you have with your health care provider.    Document Released: 01/08/2005 Document Revised: 04/05/2013 Document Reviewed: 02/21/2013  Elsevier Interactive Patient Education ?2016 Elsevier Inc.

## 2020-01-17 NOTE — Discharge Summary (Signed)
ED Clinical Summary                     [facility]  [address]  [place], [place], [address]  [phone]          PERSON INFORMATION  Name: Allison [name], Allison [name] Age:  86 Years DOB: [birth year]   Sex: Female Language: English PCP: Silvana Newness L-MD   Marital Status: Widowed Phone: [phone] Med Service: Harle Stanford   MRN: [id number] Acct# [id number] Arrival: 01/17/2020 06:08:00   Visit Reason: Fall; FELL/LT SHOULDER PAIN Acuity: 4 LOS: 000 02:57   Address:    [address] [place] [address]   Diagnosis:    Clavicle fracture  Medications:          Medications that have not changed  Other Medications  amLODIPine (Norvasc 10 mg oral tablet) 1 Tabs Oral (given by mouth) every day.  Last Dose:____________________  aspirin (aspirin 81 mg oral tablet) 81 Milligram Oral (given by mouth) every day.  Last Dose:____________________  atorvastatin (atorvastatin 40 mg oral tablet) 1 Tabs Oral (given by mouth) every day.  Last Dose:____________________  furosemide (Lasix) 10 Milligram every day.  Last Dose:____________________  lisinopril (lisinopril 20 mg oral tablet) 1 Tabs Oral (given by mouth) every day. Refills: 0.  Last Dose:____________________  multivitamin with minerals (One-A-Day Women 50 Plus oral tablet) 1 Tabs Oral (given by mouth) every day.  Last Dose:____________________      Medications Administered During Visit:              Allergies      Peaches      Chocolate      shellfish (facial swelling)      Major Tests and Procedures:  The following procedures and tests were performed during your ED visit.  COMMON PROCEDURES%>  COMMON PROCEDURES COMMENTS%>                PROVIDER INFORMATION               Provider Role Assigned Marianne Sofia [facility] ED Provider 01/17/2020 06:13:53    Chestine Spore, RN, [place]iann Mohs ED Nurse 01/17/2020 07:18:52        Attending Physician:  Ardis Hughs CHARLES-MD      Admit Doc  PRICE,  [facility] CHARLES-MD     Consulting Doc       VITALS  INFORMATION  Vital Sign Triage Latest   Temp Oral ORAL_1%> ORAL%>   Temp Temporal TEMPORAL_1%> TEMPORAL%>   Temp Intravascular INTRAVASCULAR_1%> INTRAVASCULAR%>   Temp Axillary AXILLARY_1%> AXILLARY%>   Temp Rectal RECTAL_1%> RECTAL%>   02 Sat 99 % 99 %   Respiratory Rate RATE_1%> RATE%>   Peripheral Pulse Rate PULSE RATE_1%> PULSE RATE%>   Apical Heart Rate HEART RATE_1%> HEART RATE%>   Blood Pressure BLOOD PRESSURE_1%>/ BLOOD PRESSURE_1%>91 mmHg BLOOD PRESSURE%> / BLOOD PRESSURE%>81 mmHg                 Immunizations      No Immunizations Documented This Visit          DISCHARGE INFORMATION   Discharge Disposition: H Outpt-Sent Home   Discharge Location:  Home   Discharge Date and Time:  01/17/2020 09:05:00   ED Checkout Date and Time:  01/17/2020 09:05:00     DEPART REASON INCOMPLETE INFORMATION               Depart Action Incomplete Reason   Interactive  View/I&O Recently assessed               Problems      Active           No contraindication to deep vein thrombosis (DVT) prophylaxis          HTN (hypertension)              Smoking Status      Never smoker         PATIENT EDUCATION INFORMATION  Instructions:     How to Use a Sling, Easy-to-Read; Clavicle Fracture     Follow up:                   With: Address: When:   Lovena Le, Physician - Orthopedics *[address] [place], [place] [address]  [phone] Business (1) Within 1 week       With: Address: When:   Follow up with primary care provider  Within 1 week   Comments:   Follow up with your Primary Care Physician ASAP for recheck, if you don't have a PMD call [phone] ([phone]) for help in obtaining one. Return to the Emergency Department as needed for new or worsening symptoms              ED PROVIDER DOCUMENTATION

## 2020-01-17 NOTE — ED Provider Notes (Signed)
General Medical Problem *KP        Patient:   Allison [name], Allison [name]            MRN: [id number]            FIN: [id number]               Age:   86 years     Sex:  Female     DOB:  Oct 29, 1928   Associated Diagnoses:   Clavicle fracture   Author:   Ardis Hughs CHARLES-MD      Basic Information   Time seen: Provider Seen (ST)   ED Provider/Time:    Bernardino Dowell,  Caryn Bee CHARLES-MD / 01/17/2020 06:13  .   Additional information: Chief Complaint from Nursing Triage Note   Chief Complaint  Chief Complaint: Pt states was dreaming and fell out of the bed on to the floor, woke up on the floor. C/o left sided shoulder pain and left sided head pain. (01/17/20 06:17:00).      History of Present Illness   The patient presents with Shoulder pain, headache, neck pain after a stumble and fall from bed early this morning patient states she believes that she was dreaming and either rolled out of bed or attempted to get up and stumbled she cannot recall.  She called out for family who are able to help her up she has since been able to ambulate without pain or difficulty.  Denies previous shoulder or neck injury.        Review of Systems   Constitutional symptoms:  No fever, no chills.    Eye symptoms:  Vision unchanged, No diplopia,    ENMT symptoms:  No ear pain, no sore throat.    Respiratory symptoms:  No shortness of breath, no cough.    Cardiovascular symptoms:  No chest pain, no palpitations.    Gastrointestinal symptoms:  No vomiting, no diarrhea.    Genitourinary symptoms:  No dysuria, no hematuria.    Musculoskeletal symptoms:  Joint pain.   Neurologic symptoms:  Headache, no numbness, no tingling, no focal weakness.              Additional review of systems information: All other systems reviewed and otherwise negative.      Health Status   Allergies:    No inactive allergies have been recorded..      Past Medical/ Family/ Social History   Medical history:    No active or resolved past medical history items have been selected or recorded..    Surgical history:    Exploratory laparotomy ([id number]).  Comments:  12/28/2017 18:07 EDT - [name], RN, [place] Dec  Suspected fibroids, but did not find any..   Family history:    No family history items have been selected or recorded..   Social history:    Social & Psychosocial Habits    Tobacco  10/28/2015  Use: Never smoker  .   Problem list:    Active Problems (4)  At risk for falls   HLD (hyperlipidemia)   HTN (hypertension)   No contraindication to deep vein thrombosis (DVT) prophylaxis   .   Reviewed and agree with nursing notes regarding past medical/surgical/social history      Physical Examination               Vital Signs   Vital Signs   01/17/2020 8:48 EDT Systolic Blood Pressure 171 mmHg  HI    Diastolic Blood Pressure 81 mmHg  Heart Rate Monitored 62 bpm    Respiratory Rate 16 br/min    SpO2 99 %   01/17/2020 7:19 EDT Systolic Blood Pressure 172 mmHg  HI    Diastolic Blood Pressure 88 mmHg    Heart Rate Monitored 63 bpm    Respiratory Rate 16 br/min    SpO2 99 %   01/17/2020 6:43 EDT Respiratory Rate 16 br/min   01/17/2020 6:17 EDT Systolic Blood Pressure 193 mmHg  >HHI    Diastolic Blood Pressure 91 mmHg  HI    Temperature Oral 36.8 degC    Heart Rate Monitored 68 bpm    Respiratory Rate 16 br/min    SpO2 99 %   .   Measurements   01/17/2020 6:20 EDT Body Mass Index est meas 22.70 kg/m2    Body Mass Index Measured 22.70 kg/m2   01/17/2020 6:17 EDT Height/Length Measured 165 cm    Weight Dosing 61.8 kg   .   Basic Oxygen Information   01/17/2020 8:48 EDT Oxygen Therapy Room air    SpO2 99 %   01/17/2020 7:19 EDT Oxygen Therapy Room air    SpO2 99 %   01/17/2020 6:17 EDT Oxygen Therapy Room air    SpO2 99 %   .   General:  Alert, no acute distress.    Skin:  Warm, dry.    Head:  Normocephalic, atraumatic.    Eye:  Pupils are equal, round and reactive to light, extraocular movements are intact.    Cardiovascular:  Regular rate and rhythm, No murmur.    Respiratory:  Lungs are clear to auscultation, respirations  are non-labored, breath sounds are equal.    Chest wall:  No tenderness, No deformity.    Musculoskeletal:  Normal strength, With range of motion of the left shoulder no definite deformities, Not normal ROM,    Gastrointestinal:  Soft, Nontender, Non distended, Normal bowel sounds.    Neurological:  Alert and oriented to person, place, time, and situation, No focal neurological deficit observed, normal motor observed.    Psychiatric:  Cooperative, appropriate mood & affect.       Medical Decision Making   Radiology results:  Rad Results (ST)   CT Head or Brain w/o Contrast  ?  01/17/20 07:46:14  UNENHANCED HEAD CT: 01/17/20    CLINICAL INDICATION:Head trauma, mod-severe;ACR Select.    TECHNIQUE: Axial imaging from skull base to vertex at 5.0 mm increments. CT  scanning was performed using radiation dose reduction techniques when  appropriate, per system protocols.    COMPARISON: 28 December 2017.    FINDINGS: There is no acute intracranial hemorrhage, mass/mass effect or  abnormal extra-axial fluid collection. Stable mild to moderate small vessel  disease. Cortical gray-white differentiation is preserved. Stable generalized  volume loss. No hydrocephalus. No calvarial fracture. Mastoid air cells and  included paranasal sinuses are clear.      IMPRESSION: No acute intracranial abnormality or appreciable interval change and  small vessel disease and volume loss.      These findings concur with the preliminary report provided by [facility].  ?  Signed By: Casandra Doffing B-MD  ?  **************************************************  CT Spine Cervical w/o Contrast  ?  01/17/20 08:44:39  CT CERVICAL SPINE without IV contrast : 01/17/20    INDICATION: Neck trauma;ACR Select    COMPARISON: None    TECHNIQUE: Images were obtained from skull base to upper thoracic spine, with  coronal and sagittal as well as oblique axial reconstruction images.  CT  scanning was performed using radiation dose reduction techniques  when  appropriate, per system protocol.      FINDINGS: Vertebral body heights are well-maintained and no fracture is  appreciated. Minimal anterolisthesis at C4-5 with alignment otherwise fairly  well-preserved. Severe disc narrowing at C5-6, C6-7, and C7-T1 with moderate to  severe narrowing at C3-4 and T1-T2 as well as T2-3. Slight to moderate disc  narrowing at C2-3 and C4-5. Vertebral body osteophytes are present along with  osteoarthritis of facet and uncovertebral joints. No prevertebral edema.        IMPRESSION:      No fracture identified.    Significant degenerative disc disease as well as facet and uncovertebral  osteoarthritis.  ?  Signed By: Lucy Chris JR-MD  ?  **************************************************  XR Shoulder Complete Left  ?  01/17/20 08:17:53  INDICATION: Pain, Joint, Shoulder    COMPARISON: None.    3 radiographic views of the LEFT shoulder, 01/17/20.    FINDINGS AND IMPRESSION:  Mildly displaced fracture of the distal clavicle. Mild OA.      If you have questions regarding this report, please feel free to call me  directly using [employer].  ?  Signed By: Jari Favre  .      Impression and Plan   Diagnosis   Clavicle fracture (ICD10-CM S42.009A, Discharge, Medical)   Plan   Condition: Improved.    Disposition: Discharged: to home.    Patient was given the following educational materials: Clavicle Fracture, How to Use a Sling, Easy-to-Read, How to Use a Sling, Easy-to-Read, Clavicle Fracture.    Follow up with: Follow up with primary care provider Within 1 week Follow up with  your  Primary Care Physician ASAP for recheck, if you don't have a PMD call [phone] ([phone]) for help in obtaining one. Return to the Emergency Department as needed for new or worsening symptoms, Follow up with primary care provider Within 1 week Follow up with  your  Primary Care Physician ASAP for recheck, if you don't have a PMD call [phone] ([phone]) for help in obtaining one.  Return to the Emergency Department as needed for new or worsening symptoms; Lovena Le, Physician - Orthopedics Within 1 week.    Counseled: Patient, Regarding diagnosis, Regarding treatment plan, Patient indicated understanding of instructions.    Signature Line     Electronically Signed on 01/17/2020 09:15 AM EDT   ________________________________________________   Ardis Hughs CHARLES-MD

## 2020-01-17 NOTE — ED Notes (Signed)
ED Triage Note       ED Triage Adult Entered On:  01/17/2020 6:20 EDT    Performed On:  01/17/2020 6:17 EDT by Myer Peer A-RN               Triage   Numeric Rating Pain Scale :   9   Chief Complaint :   Pt states was dreaming and fell out of the bed on to the floor, woke up on the floor. C/o left sided shoulder pain and left sided head pain.    [place] Mode of Arrival :   Wheelchair   Infectious Disease Documentation :   Document assessment   Temperature Oral :   36.8 degC(Converted to: 98.2 degF)    Heart Rate Monitored :   68 bpm   Respiratory Rate :   16 br/min   Systolic Blood Pressure :   193 mmHg (>HHI)    Diastolic Blood Pressure :   91 mmHg (HI)    SpO2 :   99 %   Oxygen Therapy :   Room air   Patient presentation :   None of the above   Chief Complaint or Presentation suggest infection :   No   Weight Dosing :   61.8 kg(Converted to: 136 lb 4 oz)    Height :   165 cm(Converted to: 5 ft 5 in)    Body Mass Index Dosing :   23 kg/m2   Myer Peer A-RN - 01/17/2020 6:17 EDT   DCP GENERIC CODE   Tracking Acuity :   4   Tracking Group :   ED [id number] Tracking Group   Myer Peer A-RN - 01/17/2020 6:17 EDT   ED General Section :   Document assessment   Pregnancy Status :   N/A   ED Allergies Section :   Document assessment   ED Reason for Visit Section :   Document assessment   ED Quick Assessment :   Patient appears awake, alert, oriented to baseline. Skin warm and dry. Moves all extremities. Respiration even and unlabored. Appears in no apparent distress.   Myer Peer A-RN - 01/17/2020 6:17 EDT   ID Risk Screen Symptoms   Recent Travel History :   No recent travel   Close Contact with COVID-19 ID :   No   Last 14 days COVID-19 ID :   No   TB Symptom Screen :   No symptoms   C. diff Symptom/History ID :   Neither of the above   Myer Peer A-RN - 01/17/2020 6:17 EDT   Allergies   (As Of: 01/17/2020 06:20:42 EDT)   Allergies (Active)   Chocolate  Estimated Onset Date:   Unspecified ; Created By:    Judi Saa RN, KRISTINA A; Reaction Status:   Active ; Category:   Drug ; Substance:   Chocolate ; Type:   Allergy ; Severity:   Moderate ; Updated By:   Judi Saa RN, Anabel Bene; Reviewed Date:   01/17/2020 6:18 EDT      Peaches  Estimated Onset Date:   Unspecified ; Created By:   Judi Saa RN, KRISTINA A; Reaction Status:   Active ; Category:   Drug ; Substance:   Peaches ; Type:   Allergy ; Severity:   Moderate ; Updated By:   Judi Saa RN, Anabel Bene; Reviewed Date:   01/17/2020 6:18 EDT      shellfish  Estimated Onset Date:   Unspecified ;  Reactions:   facial swelling ; Created By:   Jennet Maduro, RN, Ciara; Reaction Status:   Active ; Category:   Drug ; Substance:   shellfish ; Type:   Allergy ; Severity:   Severe ; Updated By:   Jennet Maduro, RN, Ciara; Reviewed Date:   01/17/2020 6:18 EDT        Psycho-Social   Last 3 mo, thoughts killing self/others :   Patient denies   Right click within box for Suspected Abuse policy link. :   None   Feels Safe Where Live :   Yes   Myer Peer A-RN - 01/17/2020 6:17 EDT   ED Reason for Visit   (As Of: 01/17/2020 06:20:42 EDT)   Problems(Active)    At risk for falls (SNOMED CT  :[id number] )  Name of Problem:   At risk for falls ; Onset Date:   06/25/2017 ; Recorder:   SYSTEM,  SYSTEM; Confirmation:   Confirmed ; Classification:   Interdisciplinary ; Code:   [id number] ; Last Updated:   06/25/2017 2:38 EDT ; Life Cycle Date:   06/25/2017 ; Life Cycle Status:   Active ; Vocabulary:   SNOMED CT   ; Comments:        06/25/2017 2:38 - SYSTEM,  SYSTEM  Problem added by Discern Expert      HLD (hyperlipidemia) (SNOMED CT  :[id number] )  Name of Problem:   HLD (hyperlipidemia) ; Recorder:   BELLEW, RN, KRISTY M; Confirmation:   Confirmed ; Classification:   Patient Stated ; Code:   [id number] ; Contributor System:   PowerChart ; Last Updated:   06/24/2017 16:16 EDT ; Life Cycle Date:   06/24/2017 ; Life Cycle Status:   Active ; Vocabulary:   SNOMED CT        HTN (hypertension) (SNOMED  CT  :[phone] )  Name of Problem:   HTN (hypertension) ; Recorder:   Daphine Deutscher, RN, Apolinar Junes; Confirmation:   Confirmed ; Classification:   Medical ; Code:   [id number] ; Contributor System:   PowerChart ; Last Updated:   10/28/2015 8:22 EDT ; Life Cycle Date:   10/28/2015 ; Life Cycle Status:   Active ; Vocabulary:   SNOMED CT        No contraindication to deep vein thrombosis (DVT) prophylaxis (SNOMED CT  :662947 )  Name of Problem:   No contraindication to deep vein thrombosis (DVT) prophylaxis ; Recorder:   [name], MD, Madlyn Frankel; Confirmation:   Confirmed ; Classification:   Medical ; Code:   [id number] ; Contributor System:   PowerChart ; Last Updated:   12/28/2017 18:29 EDT ; Life Cycle Status:   Active ; Responsible Provider:   [name], MD, Madlyn Frankel; Vocabulary:   SNOMED CT          Diagnoses(Active)    Fall  Date:   01/17/2020 ; Diagnosis Type:   Reason For Visit ; Confirmation:   Complaint of ; Clinical Dx:   Fall ; Classification:   Medical ; Clinical Service:   Emergency medicine ; Code:   PNED ; Probability:   0 ; Diagnosis Code:   [id number]

## 2020-01-17 NOTE — ED Notes (Signed)
 ED Patient Summary       ;       [facility] Emergency Department  [address], [place] [address]  [phone]  Discharge Instructions (Patient)  Name: Allison [name], Allison [name]  DOB: [birth year]                   MRN: [id number]                   FIN: [id number]  Reason For Visit: Fall; FELL/LT SHOULDER PAIN  Final Diagnosis: Clavicle fracture     Visit Date: 01/17/2020 06:08:00  Address: [address] [place] [address]  Phone: [phone]     Emergency Department Providers:         Primary Physician:      PRICE, KEVIN CHARLES      [facility] would like to thank you for allowing us  to assist you with your healthcare needs. The following includes patient education materials and information regarding your injury/illness.     Follow-up Instructions:  You were seen today on an emergency basis. Please contact your primary care doctor for a follow up appointment. If you received a referral to a specialist doctor, it is important you follow-up as instructed.    It is important that you call your follow-up doctor to schedule and confirm the location of your next appointment. Your doctor may practice at multiple locations. The office location of your follow-up appointment may be different to the one written on your discharge instructions.    If you do not have a primary care doctor, please call [phone] for help in finding a [name] [facility] Provider. For help in finding a specialist doctor, please call [phone].    If your condition gets worse before your follow-up with your primary care doctor or specialist, please return to the Emergency Department.      Coronavirus 2019 (COVID-19) Reminders:     Patients age 81 - 36, with parental consent, and patients over age 61 can make an appointment for a COVID-19 vaccine. Patients can contact their [name] doctors' offices to schedule an appointment to receive the COVID-19 vaccine. Patients who do  not have a [name] physician can call [phone] to schedule vaccination appointments.      Follow Up Appointments:  Primary Care Provider:     Name: FRANKLIN,  CHERI L-MD     Phone: [phone]                 With: Address: When:   OZELL SHARK, Physician - Orthopedics *[address] [place], [place] [address]  [phone] Business (1) Within 1 week       With: Address: When:   Follow up with primary care provider  Within 1 week   Comments:   Follow up with your Primary Care Physician ASAP for recheck, if you don't have a PMD call [phone] ([phone]) for help in obtaining one. Return to the Emergency Department as needed for new or worsening symptoms                   Medications that have not changed  Other Medications  amLODIPine  (Norvasc  10 mg oral tablet) 1 Tabs Oral (given by mouth) every day.  Last Dose:____________________  aspirin  (aspirin  81 mg oral tablet) 81 Milligram Oral (given by mouth) every day.  Last Dose:____________________  atorvastatin (atorvastatin 40 mg oral tablet)  1 Tabs Oral (given by mouth) every day.  Last Dose:____________________  furosemide  (Lasix ) 10 Milligram every day.  Last Dose:____________________  lisinopril  (lisinopril  20 mg oral tablet) 1 Tabs Oral (given by mouth) every day. Refills: 0.  Last Dose:____________________  multivitamin with minerals (One-A-Day Women 50 Plus oral tablet) 1 Tabs Oral (given by mouth) every day.  Last Dose:____________________      Allergy Info: Peaches; Chocolate; shellfish     Discharge Additional Information          Discharge Patient 01/17/20 8:59:00 EDT      Patient Education Materials:        How to Use a Sling    A sling is a type of hanging bandage. You wear it around your neck to protect an injured arm, shoulder, or other body part. You may need to wear a sling to keep you from moving the injured body part while it heals. Keeping the injured part of your body still reduces pain and speeds up healing.  Your doctor may suggest you use a sling if you have:       A broken arm.     A broken collarbone.     A shoulder injury.     Surgery.    RISKS AND COMPLICATIONS    Wearing a sling the wrong way can:     Make your injury worse.     Cause stiffness or numbness.     Affect blood circulation in your arm and hand. This can cause tingling or numbness in your fingers or hands.    HOW TO USE A SLING    The way that you should use a sling depends on your injury. It is important that you follow all of your doctor's instructions for your injury. Also follow these general suggestions:     Wear the sling so that your arm bends 90 degrees at the elbow. That is like a right angle or the shape of a capital letter L. The sling should also support your wrist and hand.     Try not to move your arm.     Do not lie down flat on your back while you have to wear a sling. Sleep in a recliner or use pillows to raise your upper body in bed.     Do not twist, raise, or move your arm in a way that could make your injury worse.     Do not lean on your arm while you have to wear a sling.     Do not lift anything while you have to wear a sling.    GET HELP IF:     You have bruising, swelling, or pain that is getting worse.     Your pain medicine is not helping.     You have a fever.    GET HELP RIGHT AWAY IF:     Your fingers are numb or tingling.     Your fingers turn blue or feel cold to the touch.     You cannot control the bleeding from your injury.     You are short of breath.    This information is not intended to replace advice given to you by your health care provider. Make sure you discuss any questions you have with your health care provider.    Document Released: 06/25/2009 Document Revised: 04/21/2014 Document Reviewed: 02/01/2014  Elsevier Interactive Patient Education ?2016 Elsevier Inc.       Clavicle Fracture  The clavicle, also called the collarbone, is the long bone that connects your shoulder to your rib cage. You can feel  your collarbone at the top of your shoulders and rib cage. A clavicle fracture is a broken clavicle. It is a common injury that can happen at any age.       CAUSES    Common causes of a clavicle fracture include:     A direct blow to your shoulder.     A car accident.     A fall, especially if you try to break your fall with an outstretched arm.     RISK FACTORS    You may be at increased risk if:     You are younger than 25 years or older than 75 years. Most clavicle fractures happen to people who are younger than 25 years.     You are a female.      You play contact sports.    SIGNS AND SYMPTOMS    A fractured clavicle is painful. It also makes it hard to move your arm. Other signs and symptoms may include:     A shoulder that drops downward and forward.      Pain when trying to lift your shoulder.     Bruising, swelling, and tenderness over your clavicle.     A grinding noise when you try to move your shoulder.      A bump over your clavicle.    DIAGNOSIS    Your health care provider can usually diagnose a clavicle fracture by asking about your injury and examining your shoulder and clavicle. He or she may take an X-ray to determine the position of your clavicle.    TREATMENT    Treatment depends on the position of your clavicle after the fracture:     If the broken ends of the bone are not out of place, your health care provider may put your arm in a sling or wrap a support bandage around your chest (figure-of-eight wrap).     If the broken ends of the bone are out of place, you may need surgery. Surgery may involve placing screws, pins, or plates to keep your clavicle stable while it heals. Healing may take about 3 months.     When your health care provider thinks your fracture has healed enough, you may have to do physical therapy to regain normal movement and build up your arm strength.    HOME CARE INSTRUCTIONS     Apply ice to the injured area:    ? Put ice in a plastic bag.    ? Place a towel between your  skin and the bag.    ? Leave the ice on for 20 minutes, 2?3 times a day.     If you have a wrap or splint:    ? Wear it all the time, and remove it only to take a bath or shower.    ? When you bathe or shower, keep your shoulder in the same position as when the sling or wrap is on.     ? Do not lift your arm.     If you have a figure-of-eight wrap:    ? Another person must tighten it every day.     ? It should be tight enough to hold your shoulders back.    ? Allow enough room to place your index finger between your body and the strap.    ? Loosen the wrap  immediately if you feel numbness or tingling in your hands.     Only take medicines as directed by your health care provider.     Avoid activities that make the injury or pain worse for 4?6 weeks after surgery.     Keep all follow-up appointments.    SEEK MEDICAL CARE IF:    Your medicine is not helping to relieve pain and swelling.    SEEK IMMEDIATE MEDICAL CARE IF:    Your arm is numb, cold, or pale, even when the splint is loose.    MAKE SURE YOU:     Understand these instructions.      Will watch your condition.     Will get help right away if you are not doing well or get worse.    This information is not intended to replace advice given to you by your health care provider. Make sure you discuss any questions you have with your health care provider.    Document Released: 01/08/2005 Document Revised: 04/05/2013 Document Reviewed: 02/21/2013  Elsevier Interactive Patient Education ?2016 Elsevier Inc.      ---------------------------------------------------------------------------------------------------------------------  [name]) encourages you to self-enroll in the [facility] Patient Portal.  [facility] Patient Portal will allow you to manage your personal health information securely from your own electronic device now and in the future.  To begin your Patient Portal enrollment process, please visit  [email or web address] Click on "Sign up now" under [facility].  If you find that you need additional assistance on the [facility] Patient Portal or need a copy of your medical records, please call the [facility] Medical Records Office at [phone].  Comment:

## 2020-01-17 NOTE — ED Notes (Signed)
ED Triage Note       ED Secondary Triage Entered On:  01/17/2020 6:21 EDT    Performed On:  01/17/2020 6:20 EDT by Myer Peer A-RN               General Information   Barriers to Learning :   None evident   COVID-19 Vaccine Status :   2 Doses received   ED Home Meds Section :   Document assessment   [facility] ED Fall Risk Section :   Document assessment   ED Advance Directives Section :   Document assessment   ED Palliative Screen :   Document assessment   Myer Peer A-RN - 01/17/2020 6:20 EDT   (As Of: 01/17/2020 06:21:16 EDT)   Problems(Active)    At risk for falls (SNOMED CT  :[id number] )  Name of Problem:   At risk for falls ; Onset Date:   06/25/2017 ; Recorder:   SYSTEM,  SYSTEM; Confirmation:   Confirmed ; Classification:   Interdisciplinary ; Code:   [id number] ; Last Updated:   06/25/2017 2:38 EDT ; Life Cycle Date:   06/25/2017 ; Life Cycle Status:   Active ; Vocabulary:   SNOMED CT   ; Comments:        06/25/2017 2:38 - SYSTEM,  SYSTEM  Problem added by Discern Expert      HLD (hyperlipidemia) (SNOMED CT  :24268341 )  Name of Problem:   HLD (hyperlipidemia) ; Recorder:   BELLEW, RN, KRISTY M; Confirmation:   Confirmed ; Classification:   Patient Stated ; Code:   [id number] ; Contributor System:   PowerChart ; Last Updated:   06/24/2017 16:16 EDT ; Life Cycle Date:   06/24/2017 ; Life Cycle Status:   Active ; Vocabulary:   SNOMED CT        HTN (hypertension) (SNOMED CT  [phone] )  Name of Problem:   HTN (hypertension) ; Recorder:   Daphine Deutscher, RN, Apolinar Junes; Confirmation:   Confirmed ; Classification:   Medical ; Code:   [phone] ; Contributor System:   PowerChart ; Last Updated:   10/28/2015 8:22 EDT ; Life Cycle Date:   10/28/2015 ; Life Cycle Status:   Active ; Vocabulary:   SNOMED CT        No contraindication to deep vein thrombosis (DVT) prophylaxis (SNOMED CT  :476546 )  Name of Problem:   No contraindication to deep vein thrombosis (DVT) prophylaxis ;  Recorder:   [name], MD, Madlyn Frankel; Confirmation:   Confirmed ; Classification:   Medical ; Code:   [id number] ; Contributor System:   PowerChart ; Last Updated:   12/28/2017 18:29 EDT ; Life Cycle Status:   Active ; Responsible Provider:   [name], MD, Madlyn Frankel; Vocabulary:   SNOMED CT          Diagnoses(Active)    Fall  Date:   01/17/2020 ; Diagnosis Type:   Reason For Visit ; Confirmation:   Complaint of ; Clinical Dx:   Fall ; Classification:   Medical ; Clinical Service:   Emergency medicine ; Code:   PNED ; Probability:   0 ; Diagnosis Code:   [id number]             -    Procedure History   (As Of: 01/17/2020 06:21:16 EDT)     Anesthesia Minutes:   0 ; Procedure Name:   Exploratory laparotomy ; Procedure Minutes:   0 ; Comments:     12/28/2017  18:07 EDT [name], RN, Huntley Dec  Suspected fibroids, but did not find any.            [facility] Fall Risk Assessment Tool   Hx of falling last 3 months ED Fall :   Yes (Single mechanical fall)   Patient confused or disoriented ED Fall :   No   Patient intoxicated or sedated ED Fall :   No   Patient impaired gait ED Fall :   Yes   Use a mobility assistance device ED Fall :   Yes   Patient altered elimination ED Fall :   No   [facility] ED Fall Score :   3    Myer Peer A-RN - 01/17/2020 6:20 EDT   ED Advance Directive   Advance Directive :   Yes   Myer Peer A-RN - 01/17/2020 6:20 EDT   Palliative Care   Does the Patient have a Life Limiting Illness :   None of the above   Myer Peer A-RN - 01/17/2020 6:20 EDT   Med Hx   Medication List   (As Of: 01/17/2020 06:21:16 EDT)   Prescription/Discharge Order    lisinopril  :   lisinopril ; Status:   Prescribed ; Ordered As Mnemonic:   lisinopril 20 mg oral tablet ; Simple Display Line:   20 mg, 1 tabs, Oral, Daily, 30 tabs, 0 Refill(s) ; Ordering Provider:   Camillia Herter; Catalog Code:   lisinopril ; Order Dt/Tm:   12/29/2017 13:10:59 EDT            Home Meds    furosemide  :    furosemide ; Status:   Documented ; Ordered As Mnemonic:   Lasix ; Simple Display Line:   10 mg, Daily, 0 Refill(s) ; Catalog Code:   furosemide ; Order Dt/Tm:   12/28/2017 12:08:27 EDT          amLODIPine  :   amLODIPine ; Status:   Documented ; Ordered As Mnemonic:   Norvasc 10 mg oral tablet ; Simple Display Line:   10 mg, 1 tabs, Oral, Daily, 0 Refill(s) ; Catalog Code:   amLODIPine ; Order Dt/Tm:   06/25/2017 12:51:45 EDT          aspirin  :   aspirin ; Status:   Documented ; Ordered As Mnemonic:   aspirin 81 mg oral tablet ; Simple Display Line:   81 mg, 1 tabs, Oral, Daily, 0 Refill(s) ; Catalog Code:   aspirin ; Order Dt/Tm:   06/25/2017 12:54:01 EDT          multivitamin with minerals  :   multivitamin with minerals ; Status:   Documented ; Ordered As Mnemonic:   One-A-Day Women 50 Plus oral tablet ; Simple Display Line:   1 tabs, Oral, Daily, 30 tabs, 0 Refill(s) ; Catalog Code:   multivitamin with minerals ; Order Dt/Tm:   06/25/2017 12:54:24 EDT          atorvastatin  :   atorvastatin ; Status:   Documented ; Ordered As Mnemonic:   atorvastatin 40 mg oral tablet ; Simple Display Line:   40 mg, 1 tabs, Oral, Daily, 0 Refill(s) ; Ordering Provider:   Nadean Corwin; Catalog Code:   atorvastatin ; Order Dt/Tm:   10/28/2015 08:23:11 EDT

## 2020-05-06 LAB — BASIC METABOLIC PANEL
Anion Gap: 12 mmol/L (ref 2–17)
BUN: 11 mg/dL (ref 8–23)
CO2: 25 mmol/L (ref 22–29)
Calcium: 9.1 mg/dL (ref 8.8–10.2)
Chloride: 103 mmol/L (ref 98–107)
Creatinine: 0.7 mg/dL (ref 0.5–1.0)
GFR African American: 89 mL/min/{1.73_m2} — ABNORMAL LOW (ref >=90)
GFR Non-African American: 77 mL/min/{1.73_m2} — ABNORMAL LOW (ref >=90)
Glucose: 102 mg/dL — ABNORMAL HIGH (ref 70–99)
OSMOLALITY CALCULATED: 279 mOsm/kg (ref 270–287)
Sodium: 140 mmol/L (ref 135–145)

## 2020-05-06 LAB — CBC WITH AUTO DIFFERENTIAL
Absolute Baso #: 0 10*3/uL (ref 0.0–0.2)
Absolute Eos #: 0.2 10*3/uL (ref 0.0–0.5)
Absolute Lymph #: 2.2 10*3/uL (ref 1.0–3.2)
Absolute Mono #: 0.5 10*3/uL (ref 0.3–1.0)
Basophils %: 0.7 % (ref 0.0–2.0)
Eosinophils %: 4.1 % (ref 0.0–7.0)
Hematocrit: 39.8 % (ref 34.0–47.0)
Hemoglobin: 13.9 g/dL (ref 11.5–15.7)
Immature Grans (Abs): 0.01 10*3/uL (ref 0.00–0.06)
Immature Granulocytes: 0.2 % (ref 0.1–0.6)
Lymphocytes: 40 % (ref 15.0–45.0)
MCH: 28 pg (ref 27.0–34.5)
MCHC: 34.9 g/dL (ref 32.0–36.0)
MCV: 80.2 fL — ABNORMAL LOW (ref 81.0–99.0)
MPV: 11.9 fL (ref 7.2–13.2)
Monocytes: 9.3 % (ref 4.0–12.0)
NRBC Absolute: 0 10*3/uL (ref 0.000–0.012)
NRBC Automated: 0 % (ref 0.0–0.2)
Neutrophils %: 45.7 % (ref 42.0–74.0)
Neutrophils Absolute: 2.5 10*3/uL (ref 1.6–7.3)
Platelets: 174 10*3/uL (ref 140–440)
RBC: 4.96 x10e6/mcL (ref 3.60–5.20)
RDW: 13.4 % (ref 11.0–16.0)
WBC: 5.4 10*3/uL (ref 3.8–10.6)

## 2020-05-06 LAB — URINALYSIS W/ RFLX MICROSCOPIC
Bilirubin Urine: NEGATIVE
Blood, Urine: NEGATIVE
Glucose, UA: NEGATIVE mg/dL
Ketones, Urine: NEGATIVE mg/dL
Leukocyte Esterase, Urine: NEGATIVE
Nitrite, Urine: NEGATIVE
Protein, UA: NEGATIVE
Specific Gravity, UA: 1.015 (ref 1.003–1.035)
Urobilinogen, Urine: 0.2 EU/dL
pH, UA: 7 (ref 4.5–8.0)

## 2020-05-06 NOTE — ED Notes (Signed)
 ED Patient Summary              [facility] Emergency Department  [address], [place] [address]  [phone]  Discharge Instructions (Patient)  Name: Allison [name], Allison [name]  DOB: 12-26-1928                   MRN: [id number]                   FIN: [id number]  Reason For Visit: Weakness; AMS/EMS  Final Diagnosis: Weakness     Visit Date: 05/06/2020 14:55:00  Address: [address] [place] [address]  Phone: [phone]     Emergency Department Providers:         Primary Physician:      NEYSA ANDREZ CHRISTELLA [facility] would like to thank you for allowing us  to assist you with your healthcare needs. The following includes patient education materials and information regarding your injury/illness.     Follow-up Instructions:  You were seen today on an emergency basis. Please contact your primary care doctor for a follow up appointment. If you received a referral to a specialist doctor, it is important you follow-up as instructed.    It is important that you call your follow-up doctor to schedule and confirm the location of your next appointment. Your doctor may practice at multiple locations. The office location of your follow-up appointment may be different to the one written on your discharge instructions.    If you do not have a primary care doctor, please call [phone] for help in finding a [name] [facility] Provider. For help in finding a specialist doctor, please call [phone].    If your condition gets worse before your follow-up with your primary care doctor or specialist, please return to the Emergency Department.      Coronavirus 2019 (COVID-19) Reminders:     Patients age 21 - 61, with parental consent, and patients over age 18 can make an appointment for a COVID-19 vaccine. Patients can contact their [facility] doctors' offices to schedule an appointment to receive the COVID-19 vaccine. Patients who do not have a [facility]  physician can call [phone] to schedule vaccination appointments.      Follow Up Appointments:  Primary Care Provider:     Name: FRANKLIN,  CHERI L-MD     Phone: [phone]                 With: Address: When:   We were unable to get a result for your potassium because the specimen hemolyzed. Please consider having this repeated by your primary care physician         With: Address: When:   Follow up with primary care provider  Within 1 week   Comments:   Return to ED if symptoms worsen                   Medications that have not changed  Other Medications  amLODIPine  (Norvasc  10 mg oral tablet) 1 Tabs Oral (given by mouth) every day.  Last Dose:____________________  aspirin  (aspirin  81 mg oral tablet) 81 Milligram Oral (given by mouth) every day.  Last Dose:____________________  atorvastatin (atorvastatin 40 mg oral tablet) 1 Tabs Oral (given by mouth) every day.  Last Dose:____________________  furosemide  (Lasix ) 10 Milligram every day.  Last Dose:____________________  lisinopril  (lisinopril  20 mg oral tablet)  1 Tabs Oral (given by mouth) every day. Refills: 0.  Last Dose:____________________  multivitamin with minerals (One-A-Day Women 50 Plus oral tablet) 1 Tabs Oral (given by mouth) every day.  Last Dose:____________________      Allergy Info: Peaches; Chocolate; shellfish     Discharge Additional Information          Discharge Patient 05/06/20 19:51:00 EST      Patient Education Materials:               Weakness with Uncertain Cause   Based on your exam today, the exact cause of your weakness is not clear. But your weakness does not seem to be a sign of a serious illness at this time. Keep an eye on your symptoms and get medical advice as instructed below.    Home care    Rest at home today. Don't over-exert yourself.     Take any medicine as prescribed.     For thenext few days, drink extra fluids (unless your healthcare provider wants you to restrict fluids for other reasons). Don't skip meals.       Unless otherwise directed, continue to take any prescription medicines.     Contact your healthcare provider if you have any questions or concerns.    Follow-up care   Follow up with your healthcare provider, or as advised.   When to seek medical advice   Call your healthcare provider right awayfor any of the following:     Symptoms get worse     Symptoms don't start getting better within 2 days     Fever of 100.4 F (38 C) or higher, or as directed by your healthcare provider     Call 911   Call 911 for any of these:      Chest, arm, neck, jaw, or upper back pain      Trouble breathing     Numbness or weakness of the face, one arm, or one leg      Slurred speech, confusion, ortrouble speaking, walking, or seeing      Blood in vomit or stool (black or red color)      Severe headache     Loss of consciousness        2000-2021 [employer], [place]. All rights reserved. This information is not intended as a substitute for professional medical care. Always follow your healthcare professional's instructions.     ---------------------------------------------------------------------------------------------------------------------  [name]) encourages you to self-enroll in the [facility] Patient Portal.  [facility] Patient Portal will allow you to manage your personal health information securely from your own electronic device now and in the future.  To begin your Patient Portal enrollment process, please visit [email or web address] Click on "Sign up now" under [facility].  If you find that you need additional assistance on the [facility] Patient Portal or need a copy of your medical records, please call the [facility] Medical Records Office at [phone].  Comment:

## 2020-05-06 NOTE — ED Notes (Signed)
ED Patient Education Note     Patient Education Materials Follows:  Symptoms            Weakness with Uncertain Cause   Based on your exam today, the exact cause of your weakness is not clear. But your weakness does not seem to be a sign of a serious illness at this time. Keep an eye on your symptoms and get medical advice as instructed below.    Home care    Rest at home today. Don't over-exert yourself.     Take any medicine as prescribed.     For thenext few days, drink extra fluids (unless your healthcare provider wants you to restrict fluids for other reasons). Don't skip meals.      Unless otherwise directed, continue to take any prescription medicines.     Contact your healthcare provider if you have any questions or concerns.    Follow-up care   Follow up with your healthcare provider, or as advised.   When to seek medical advice   Call your healthcare provider right awayfor any of the following:     Symptoms get worse     Symptoms don't start getting better within 2 days     Fever of 100.4 F (38 C) or higher, or as directed by your healthcare provider     Call 911   Call 911 for any of these:      Chest, arm, neck, jaw, or upper back pain      Trouble breathing     Numbness or weakness of the face, one arm, or one leg      Slurred speech, confusion, ortrouble speaking, walking, or seeing      Blood in vomit or stool (black or red color)      Severe headache     Loss of consciousness        2000-2021 [employer], [place]. All rights reserved. This information is not intended as a substitute for professional medical care. Always follow your healthcare professional's instructions.

## 2020-05-06 NOTE — Discharge Summary (Signed)
ED Clinical Summary                     [facility]  [address]  [place], [place], [address]  [phone]          PERSON INFORMATION  Name: Allison [name], Allison [name] Age:  87 Years DOB: 02-13-1929   Sex: Female Language: English PCP: Silvana Newness L-MD   Marital Status: Widowed Phone: [phone] Med Service: Harle Stanford   MRN: [id number] Acct# [id number] Arrival: 05/06/2020 14:55:00   Visit Reason: Weakness; AMS/EMS Acuity: 3 LOS: 000 05:18   Address:    [address] [place] [address]   Diagnosis:    Weakness  Medications:          Medications that have not changed  Other Medications  amLODIPine (Norvasc 10 mg oral tablet) 1 Tabs Oral (given by mouth) every day.  Last Dose:____________________  aspirin (aspirin 81 mg oral tablet) 81 Milligram Oral (given by mouth) every day.  Last Dose:____________________  atorvastatin (atorvastatin 40 mg oral tablet) 1 Tabs Oral (given by mouth) every day.  Last Dose:____________________  furosemide (Lasix) 10 Milligram every day.  Last Dose:____________________  lisinopril (lisinopril 20 mg oral tablet) 1 Tabs Oral (given by mouth) every day. Refills: 0.  Last Dose:____________________  multivitamin with minerals (One-A-Day Women 50 Plus oral tablet) 1 Tabs Oral (given by mouth) every day.  Last Dose:____________________      Medications Administered During Visit:              Allergies      Peaches      Chocolate      shellfish (facial swelling)      Major Tests and Procedures:  The following procedures and tests were performed during your ED visit.  COMMON PROCEDURES%>  COMMON PROCEDURES COMMENTS%>                PROVIDER INFORMATION               Provider Role Assigned Eugenio Hoes, [facility] M-DO ED Provider 05/06/2020 15:22:44    Allyson Sabal D-RN ED Nurse 05/06/2020 15:59:04    White, RN, Reola Mosher ED Nurse 05/06/2020 18:58:43        Attending Physician:  Gilman Buttner M-DO      Admit Doc  YOUNG,  KELLI M-DO     Consulting Doc        VITALS INFORMATION  Vital Sign Triage Latest   Temp Oral ORAL_1%> ORAL%>   Temp Temporal TEMPORAL_1%> TEMPORAL%>   Temp Intravascular INTRAVASCULAR_1%> INTRAVASCULAR%>   Temp Axillary AXILLARY_1%> AXILLARY%>   Temp Rectal RECTAL_1%> RECTAL%>   02 Sat 97 % 99 %   Respiratory Rate RATE_1%> RATE%>   Peripheral Pulse Rate PULSE RATE_1%> PULSE RATE%>   Apical Heart Rate HEART RATE_1%> HEART RATE%>   Blood Pressure BLOOD PRESSURE_1%>/ BLOOD PRESSURE_1%>84 mmHg BLOOD PRESSURE%> / BLOOD PRESSURE%>78 mmHg                 Immunizations      No Immunizations Documented This Visit          DISCHARGE INFORMATION   Discharge Disposition: H Outpt-Sent Home   Discharge Location:  Home   Discharge Date and Time:  05/06/2020 20:13:47   ED Checkout Date and Time:  05/06/2020 20:13:47     DEPART REASON INCOMPLETE INFORMATION  Depart Action Incomplete Reason   Interactive View/I&O Recently assessed               Problems      Active           No contraindication to deep vein thrombosis (DVT) prophylaxis          HTN (hypertension)              Smoking Status      Never smoker         PATIENT EDUCATION INFORMATION  Instructions:     Weakness (Uncertain Cause)     Follow up:                   With: Address: When:   We were unable to get a result for your potassium because the specimen hemolyzed. Please consider having this repeated by your primary care physician         With: Address: When:   Follow up with primary care provider  Within 1 week   Comments:   Return to ED if symptoms worsen              ED PROVIDER DOCUMENTATION

## 2020-05-06 NOTE — ED Notes (Signed)
ED Triage Note       ED Secondary Triage Entered On:  05/06/2020 16:00 EST    Performed On:  05/06/2020 15:15 EST by Allyson Sabal D-RN               General Information   Barriers to Learning :   None evident   Languages :   English   ED Home Meds Section :   Document assessment   [facility] ED Fall Risk Section :   Document assessment   ED Advance Directives Section :   Document assessment   ED Palliative Screen :   Document assessment   Allyson Sabal D-RN - 05/06/2020 15:59 EST   (As Of: 05/06/2020 16:00:30 EST)   Problems(Active)    At risk for falls (SNOMED CT  :322025427 )  Name of Problem:   At risk for falls ; Onset Date:   06/25/2017 ; Recorder:   SYSTEM,  SYSTEM; Confirmation:   Confirmed ; Classification:   Interdisciplinary ; Code:   [id number] ; Last Updated:   06/25/2017 2:38 EDT ; Life Cycle Date:   06/25/2017 ; Life Cycle Status:   Active ; Vocabulary:   SNOMED CT   ; Comments:        06/25/2017 2:38 - SYSTEM,  SYSTEM  Problem added by Discern Expert      HLD (hyperlipidemia) (SNOMED CT  :15176160 )  Name of Problem:   HLD (hyperlipidemia) ; Recorder:   BELLEW, RN, KRISTY M; Confirmation:   Confirmed ; Classification:   Patient Stated ; Code:   [id number] ; Contributor System:   PowerChart ; Last Updated:   06/24/2017 16:16 EDT ; Life Cycle Date:   06/24/2017 ; Life Cycle Status:   Active ; Vocabulary:   SNOMED CT        HTN (hypertension) (SNOMED CT  :[phone] )  Name of Problem:   HTN (hypertension) ; Recorder:   Daphine Deutscher, RN, Apolinar Junes; Confirmation:   Confirmed ; Classification:   Medical ; Code:   [phone] ; Contributor System:   PowerChart ; Last Updated:   10/28/2015 8:22 EDT ; Life Cycle Date:   10/28/2015 ; Life Cycle Status:   Active ; Vocabulary:   SNOMED CT        No contraindication to deep vein thrombosis (DVT) prophylaxis (SNOMED CT  :676195 )  Name of Problem:   No contraindication to deep vein thrombosis (DVT) prophylaxis ; Recorder:   THOMPSON,   THEODORE TELL-MD; Confirmation:   Confirmed ; Classification:   Medical ; Code:   [id number] ; Contributor System:   PowerChart ; Last Updated:   12/28/2017 18:29 EDT ; Life Cycle Status:   Active ; Responsible Provider:   [place] [facility]; Vocabulary:   SNOMED CT          Diagnoses(Active)    Weakness  Date:   05/06/2020 ; Diagnosis Type:   Reason For Visit ; Confirmation:   Complaint of ; Clinical Dx:   Weakness ; Classification:   Medical ; Clinical Service:   Emergency medicine ; Code:   PNED ; Probability:   0 ; Diagnosis Code:   [id number]             -    Procedure History   (As Of: 05/06/2020 16:00:30 EST)     Anesthesia Minutes:   0 ; Procedure Name:   Exploratory laparotomy ; Procedure Minutes:   0 ; Comments:     12/28/2017 18:07 EDT - [name],  RN, [place] Dec  Suspected fibroids, but did not find any. ; Last Reviewed Dt/Tm:   05/06/2020 15:59:56 EST            [facility] Fall Risk Assessment Tool   Hx of falling last 3 months ED Fall :   No   Patient confused or disoriented ED Fall :   No   Patient intoxicated or sedated ED Fall :   No   Patient impaired gait ED Fall :   No   Use a mobility assistance device ED Fall :   No   Patient altered elimination ED Fall :   No   [facility] ED Fall Score :   0    Allyson Sabal D-RN - 05/06/2020 15:59 EST   ED Advance Directive   Advance Directive :   No   Allyson Sabal D-RN - 05/06/2020 15:59 EST   Palliative Care   Does the Patient have a Life Limiting Illness :   None of the above   Allyson Sabal D-RN - 05/06/2020 15:59 EST   Med Hx   Medication List   (As Of: 05/06/2020 16:00:30 EST)   Prescription/Discharge Order    lisinopril  :   lisinopril ; Status:   Prescribed ; Ordered As Mnemonic:   lisinopril 20 mg oral tablet ; Simple Display Line:   20 mg, 1 tabs, Oral, Daily, 30 tabs, 0 Refill(s) ; Ordering Provider:   Camillia Herter; Catalog Code:   lisinopril ; Order Dt/Tm:   12/29/2017 13:10:59 EDT            Home Meds    furosemide  :    furosemide ; Status:   Documented ; Ordered As Mnemonic:   Lasix ; Simple Display Line:   10 mg, Daily, 0 Refill(s) ; Catalog Code:   furosemide ; Order Dt/Tm:   12/28/2017 12:08:27 EDT          amLODIPine  :   amLODIPine ; Status:   Documented ; Ordered As Mnemonic:   Norvasc 10 mg oral tablet ; Simple Display Line:   10 mg, 1 tabs, Oral, Daily, 0 Refill(s) ; Catalog Code:   amLODIPine ; Order Dt/Tm:   06/25/2017 12:51:45 EDT          aspirin  :   aspirin ; Status:   Documented ; Ordered As Mnemonic:   aspirin 81 mg oral tablet ; Simple Display Line:   81 mg, 1 tabs, Oral, Daily, 0 Refill(s) ; Catalog Code:   aspirin ; Order Dt/Tm:   06/25/2017 12:54:01 EDT          multivitamin with minerals  :   multivitamin with minerals ; Status:   Documented ; Ordered As Mnemonic:   One-A-Day Women 50 Plus oral tablet ; Simple Display Line:   1 tabs, Oral, Daily, 30 tabs, 0 Refill(s) ; Catalog Code:   multivitamin with minerals ; Order Dt/Tm:   06/25/2017 12:54:24 EDT          atorvastatin  :   atorvastatin ; Status:   Documented ; Ordered As Mnemonic:   atorvastatin 40 mg oral tablet ; Simple Display Line:   40 mg, 1 tabs, Oral, Daily, 0 Refill(s) ; Ordering Provider:   Nadean Corwin; Catalog Code:   atorvastatin ; Order Dt/Tm:   10/28/2015 08:23:11 EDT

## 2020-05-06 NOTE — ED Provider Notes (Signed)
General Medical Problem *ED        Patient:   Allison [name], Allison [name]            MRN: [id number]            FIN: [id number]               Age:   87 years     Sex:  Female     DOB:  01-17-1929   Associated Diagnoses:   Weakness   Author:   Tora Duck M-DO      Basic Information   Time seen: Provider Seen (ST)   ED Provider/Time:    Tora Duck M-DO / 05/06/2020 15:22  .   History source: Patient, family.   Arrival mode: Ambulance.   History limitation: None.   Additional information: Chief Complaint from Nursing Triage Note   Chief Complaint  Chief Complaint: pt called ems w/ her life alert. pt has new onset confusion/weakness according to her POA. bp was initially 202/86 and came down to 146/70. pt lives home alone. (05/06/20 14:55:00).      History of Present Illness   87 year old female brought in by EMS after she called life alert.  Patient had confusion and generalized weakness according to her family member at bedside.  Typically the patient does not eat on Sunday mornings until after church.  Her granddaughter had called her to tell her that it was very cold out today so the patient had decided to skip church.  Granddaughter states that patient is seeming more back to normal at this time.  No known COVID exposures.  Not vaccinated for COVID-19.      Review of Systems   Constitutional symptoms:  Negative except as documented in HPI, no fever, no chills.    Skin symptoms:  No rash,    Eye symptoms:  Vision unchanged.   ENMT symptoms:  No sore throat, no nasal congestion.    Respiratory symptoms:  No shortness of breath, no cough.    Cardiovascular symptoms:  No chest pain, no palpitations.    Gastrointestinal symptoms:  No abdominal pain, no nausea, no vomiting, no diarrhea, no rectal bleeding.    Genitourinary symptoms:  No dysuria, no hematuria.    Musculoskeletal symptoms:  No back pain,    Neurologic symptoms:  No headache,              Additional review of systems information: All other systems reviewed and  otherwise negative.      Health Status   Allergies:    Allergic Reactions (Selected)  Severe  Shellfish- Facial swelling.  Moderate  Chocolate- No reactions were documented.  Peaches- No reactions were documented..      Past Medical/ Family/ Social History   Medical history: Reviewed as documented in chart.   Surgical history: Reviewed as documented in chart.   Family history: Not significant.   Social history: Reviewed as documented in chart.   Problem list:    Active Problems (4)  At risk for falls   HLD (hyperlipidemia)   HTN (hypertension)   No contraindication to deep vein thrombosis (DVT) prophylaxis   , per nurse's notes.      Physical Examination               Vital Signs   Vital Signs   7/98/9211 94:17 EST Systolic Blood Pressure 408 mmHg  HI    Diastolic Blood Pressure 144 mmHg  >HHI    Heart Rate Monitored  56 bpm  LOW    Respiratory Rate 14 br/min    SpO2 98 %   05/06/2020 18:54 EST Heart Rate Monitored 58 bpm  LOW    Respiratory Rate 16 br/min    SpO2 98 %   9/79/8921 19:41 EST Systolic Blood Pressure 740 mmHg  HI    Diastolic Blood Pressure 89 mmHg    Heart Rate Monitored 59 bpm  LOW    Respiratory Rate 18 br/min    SpO2 100 %   11/25/4816 56:31 EST Systolic Blood Pressure 497 mmHg  HI    Diastolic Blood Pressure 84 mmHg    Temperature Oral 36.6 degC    Heart Rate Monitored 61 bpm    Respiratory Rate 18 br/min    SpO2 97 %   .   Measurements   05/06/2020 15:00 EST Body Mass Index est meas 23.49 kg/m2    Body Mass Index Measured 23.49 kg/m2   05/06/2020 14:55 EST Height/Length Measured 167 cm    Weight Dosing 65.5 kg   .   Basic Oxygen Information   05/06/2020 18:55 EST Oxygen Therapy Room air    SpO2 98 %   05/06/2020 18:54 EST SpO2 98 %   05/06/2020 17:01 EST Oxygen Therapy Room air    SpO2 100 %   05/06/2020 16:00 EST Oxygen Therapy Room air   05/06/2020 14:55 EST Oxygen Therapy Room air    SpO2 97 %   .   General:  Alert, no acute distress.    Skin:  Warm, dry, intact.    Head:  Normocephalic, atraumatic.     Neck:  Supple, trachea midline.    Eye:  Extraocular movements are intact, normal conjunctiva.    Cardiovascular:  Regular rate and rhythm, Normal peripheral perfusion.    Respiratory:  Lungs are clear to auscultation, respirations are non-labored.    Chest wall:  No tenderness.   Back:  Normal range of motion.   Musculoskeletal:  Normal ROM, no deformity.    Gastrointestinal:  Soft, Nontender, Non distended.    Neurological:  Alert and oriented to person, place, time, and situation, No focal neurological deficit observed, CN II-XII intact, normal sensory observed, normal motor observed, normal speech observed.       Medical Decision Making   Rationale:  Patient had no specific complaints and no focal findings on exam.  Labs did not reveal any significant abnormalities but the patient did have 2 hemolyzed potassium specimens.  After the second specimen hemolyzed I had plan to draw an additional, but the patient and her granddaughter refused because they had been here too long and wanted to go home.  They will follow-up with her primary care physician later this week.   Documents reviewed:  Emergency department nurses' notes.   Electrocardiogram:  Emergency Provider interpretation performed by me, See ECG ED Review.    Results review:  Lab results : Lab View   05/06/2020 18:28 EST Potassium Lvl Hemolyzed   05/06/2020 17:56 EST Estimated Creatinine Clearance 50.51 mL/min   05/06/2020 15:53 EST WBC 5.4 x10e3/mcL    RBC 4.96 x10e6/mcL    Hgb 13.9 g/dL    HCT 39.8 %    MCV 80.2 fL  LOW    MCH 28.0 pg    MCHC 34.9 g/dL    RDW 13.4 %    Platelet 174 x10e3/mcL    MPV 11.9 fL    Neutro Auto 45.7 %    Neutro Absolute 2.5 x10e3/mcL    Immature  Grans Percent 0.2 %    Immature Grans Absolute 0.01 x10e3/mcL    Lymph Auto 40.0 %    Lymph Absolute 2.2 x10e3/mcL    Mono Auto 9.3 %    Mono Absolute 0.5 x10e3/mcL    Eosinophil Percent 4.1 %    Eos Absolute 0.2 x10e3/mcL    Basophil Auto 0.7 %    Baso Absolute 0.0 x10e3/mcL    NRBC  Absolute Auto 0.000 x10e3/mcL    NRBC Percent Auto 0.0 %    UA Color Yellow    UA Appear Clear    UA Glucose Negative mg/dL    UA Bili Negative    UA Ketones Negative mg/dL    UA Spec Grav 1.015    UA Blood Negative    UA pH 7.0    Protein U Negative    UA Urobilinogen 0.2 EU/dL    UA Nitrite Negative    UA Leuk Est Negative    Sodium Lvl 140 mmol/L    Potassium Lvl Hemolyzed mmol/L    Chloride 103 mmol/L    CO2 25 mmol/L    Glucose Random 102 mg/dL  HI    BUN 11 mg/dL    Creatinine Lvl 0.7 mg/dL    AGAP 12 mmol/L    Osmolality Calc 279 mOsm/kg    Calcium Lvl 9.1 mg/dL    eGFR AA 89 mL/min/1.73m???  LOW    eGFR Non-AA 77 mL/min/1.767m??  LOW   05/06/2020 15:00 EST Estimated Creatinine Clearance 58.92 mL/min   .   Radiology results:  Rad Results (ST)   XR Chest 1 View Portable  ?  05/06/20 16:22:43  Chest AP: 05/06/20    INDICATION:Chest pain.    COMPARISON: June 24, 2017 chest radiograph    FINDINGS:  Support Devices: None.    Heart/Mediastinum: Stable.    Lungs/Pleura: Slightly coarsened interstitial lung markings are unchanged from  prior examination. Patchy opacities at the right lung base. No pleural effusion.  No pneumothorax.    Bones/Soft Tissues: No acute abnormality. Degenerative changes of the spine and  pelvis.    IMPRESSION:  Small patchy opacities at the right lung base, potentially atelectasis, however  consider correlation for aspiration/pneumonia.  ?  Signed By: OBWaldon Reining-MD  .      Impression and Plan   Diagnosis   Weakness (ICD10-CM R53.1, Discharge, Medical)   Plan   Condition: Stable.    Disposition: Discharged: Time  05/06/2020 19:50:00, to home.    Patient was given the following educational materials: Weakness (Uncertain Cause).    Follow up with: Follow up with primary care provider Within 1 week Return to ED if symptoms worsen, Follow up with primary care provider Within 1 week Return to ED if symptoms worsen; We were unable to get a result for your potassium because the specimen  hemolyzed. Please consider having this repeated by your primary care physician.    Counseled: Patient, Family, Regarding diagnosis, Regarding diagnostic results, Regarding treatment plan, Patient indicated understanding of instructions.    Signature Line     Electronically Signed on 05/06/2020 10:57 PM EST   ________________________________________________   YOTora Duck-DO               Modified by: YOTora Duck-DO on 05/06/2020 10:57 PM EST

## 2020-05-06 NOTE — ED Notes (Signed)
ED Triage Note       ED Triage Adult Entered On:  05/06/2020 15:00 EST    Performed On:  05/06/2020 14:55 EST by Allyson Sabal D-RN               Triage   Numeric Rating Pain Scale :   0 = No pain   Chief Complaint :   pt called ems w/ her life alert. pt has new onset confusion/weakness according to her POA. bp was initially 202/86 and came down to 146/70. pt lives home alone.    [place] Mode of Arrival :   Ambulance   Infectious Disease Documentation :   Document assessment   Temperature Oral :   36.6 degC(Converted to: 97.9 degF)    Heart Rate Monitored :   61 bpm   Respiratory Rate :   18 br/min   Systolic Blood Pressure :   176 mmHg (HI)    Diastolic Blood Pressure :   84 mmHg   SpO2 :   97 %   Oxygen Therapy :   Room air   Patient presentation :   Altered mental status, new onset   Chief Complaint or Presentation suggest infection :   Yes   Weight Dosing :   65.5 kg(Converted to: 144 lb 6 oz)    Height :   167 cm(Converted to: 5 ft 6 in)    Body Mass Index Dosing :   23 kg/m2   Allyson Sabal D-RN - 05/06/2020 14:55 EST   DCP GENERIC CODE   Tracking Acuity :   3   Tracking Group :   [id number] [employer]   Allyson Sabal D-RN - 05/06/2020 14:55 EST   ED General Section :   Document assessment   Pregnancy Status :   N/A   ED Allergies Section :   Document assessment   ED Reason for Visit Section :   Document assessment   ED Quick Assessment :   Patient appears awake, alert, oriented to baseline. Skin warm and dry. Moves all extremities. Respiration even and unlabored. Appears in no apparent distress.   Allyson Sabal D-RN - 05/06/2020 14:55 EST   PTA/Triage Treatments   ED PTA Pre-Arrival Service :   [facility]   Allyson Sabal D-RN - 05/06/2020 14:55 EST   ID Risk Screen Symptoms   Recent Travel History :   No recent travel   Close Contact with COVID-19 ID :   No   Last 14 days COVID-19 ID :   No   TB Symptom Screen :   No symptoms   C. diff Symptom/History ID :   Neither of the above   Allyson Sabal  D-RN - 05/06/2020 14:55 EST   Allergies   (As Of: 05/06/2020 15:00:28 EST)   Allergies (Active)   Chocolate  Estimated Onset Date:   Unspecified ; Created By:   Judi Saa RN, KRISTINA A; Reaction Status:   Active ; Category:   Drug ; Substance:   Chocolate ; Type:   Allergy ; Severity:   Moderate ; Updated By:   Judi Saa RN, Anabel Bene; Reviewed Date:   05/06/2020 15:00 EST      Peaches  Estimated Onset Date:   Unspecified ; Created By:   Judi Saa RN, KRISTINA A; Reaction Status:   Active ; Category:   Drug ; Substance:   Peaches ; Type:   Allergy ; Severity:   Moderate ; Updated  By:   Roderic Scarce; Reviewed Date:   05/06/2020 15:00 EST      shellfish  Estimated Onset Date:   Unspecified ; Reactions:   facial swelling ; Created By:   Jennet Maduro, RN, Ciara; Reaction Status:   Active ; Category:   Drug ; Substance:   shellfish ; Type:   Allergy ; Severity:   Severe ; Updated By:   Jennet Maduro, RN, Ciara; Reviewed Date:   05/06/2020 15:00 EST        Psycho-Social   Last 3 mo, thoughts killing self/others :   Patient denies   Right click within box for Suspected Abuse policy link. :   None   Feels Safe Where Live :   Yes   Allyson Sabal D-RN - 05/06/2020 14:55 EST   ED Reason for Visit   (As Of: 05/06/2020 15:00:28 EST)   Problems(Active)    At risk for falls (SNOMED CT  :027253664 )  Name of Problem:   At risk for falls ; Onset Date:   06/25/2017 ; Recorder:   SYSTEM,  SYSTEM; Confirmation:   Confirmed ; Classification:   Interdisciplinary ; Code:   [id number] ; Last Updated:   06/25/2017 2:38 EDT ; Life Cycle Date:   06/25/2017 ; Life Cycle Status:   Active ; Vocabulary:   SNOMED CT   ; Comments:        06/25/2017 2:38 - SYSTEM,  SYSTEM  Problem added by Discern Expert      HLD (hyperlipidemia) (SNOMED CT  :56387564 )  Name of Problem:   HLD (hyperlipidemia) ; Recorder:   BELLEW, RN, KRISTY M; Confirmation:   Confirmed ; Classification:   Patient Stated ; Code:   [id number] ; Contributor System:   PowerChart ; Last Updated:    06/24/2017 16:16 EDT ; Life Cycle Date:   06/24/2017 ; Life Cycle Status:   Active ; Vocabulary:   SNOMED CT        HTN (hypertension) (SNOMED CT  :[phone] )  Name of Problem:   HTN (hypertension) ; Recorder:   Daphine Deutscher, RN, Apolinar Junes; Confirmation:   Confirmed ; Classification:   Medical ; Code:   [phone] ; Contributor System:   PowerChart ; Last Updated:   10/28/2015 8:22 EDT ; Life Cycle Date:   10/28/2015 ; Life Cycle Status:   Active ; Vocabulary:   SNOMED CT        No contraindication to deep vein thrombosis (DVT) prophylaxis (SNOMED CT  :500938 )  Name of Problem:   No contraindication to deep vein thrombosis (DVT) prophylaxis ; Recorder:   [name], MD, Madlyn Frankel; Confirmation:   Confirmed ; Classification:   Medical ; Code:   182993 ; Contributor System:   PowerChart ; Last Updated:   12/28/2017 18:29 EDT ; Life Cycle Status:   Active ; Responsible Provider:   [name], MD, Madlyn Frankel; Vocabulary:   SNOMED CT          Diagnoses(Active)    Weakness  Date:   05/06/2020 ; Diagnosis Type:   Reason For Visit ; Confirmation:   Complaint of ; Clinical Dx:   Weakness ; Classification:   Medical ; Clinical Service:   Emergency medicine ; Code:   PNED ; Probability:   0 ; Diagnosis Code:   [id number]

## 2020-05-06 NOTE — ED Notes (Signed)
ED Pre-Arrival Note        Pre-Arrival Summary    Name:  ccems,    Current Date:  05/06/2020 15:00:44 EST  Gender:  Female  Date of Birth:    Age:  87  Pre-Arrival Type:  EMS  ETA:  05/06/2020 15:13:00 EST  Primary Care Physician:    Presenting Problem:  ams  Pre-Arrival User:  Tresa Endo, RN, Rex Kras  Referring Source:    Location:  [facility]            PreArrival Communication Form  Emergency Department        Additional Patient Information:        Orders:  [    ] CBC                                            [     ] CT Head no contrast  [    ] BMP                                           [     ] CT Abdomen/Pelvis no contrast  [    ] PT/INR                                       [     ] CT Abdomen/Pelvis IV contrast, w/ oral contrast  [    ] Troponin                                   [     ] CT Abdomen/Pelvis IV contrast, no oral contrast  [    ] BNP                                            [     ] See ordersheet  [    ] CXR                                             [     ] Other:__________________________  [    ] EKG

## 2020-05-07 LAB — POTASSIUM, HEMOLYZED

## 2020-08-30 NOTE — ED Notes (Signed)
ED Triage Note       ED Secondary Triage Entered On:  08/30/2020 23:22 EDT    Performed On:  08/30/2020 23:21 EDT by Jackey Loge               General Information   Barriers to Learning :   None evident   COVID-19 Vaccine Status :   Not received   ED Home Meds Section :   Document assessment   [facility] ED Fall Risk Section :   Document assessment   ED Advance Directives Section :   Document assessment   ED Palliative Screen :   Document assessment   Jackey Loge - 08/30/2020 23:21 EDT   (As Of: 08/30/2020 23:22:24 EDT)   Problems(Active)    At risk for falls (SNOMED CT  :454098119 )  Name of Problem:   At risk for falls ; Onset Date:   06/25/2017 ; Recorder:   SYSTEM,  SYSTEM; Confirmation:   Confirmed ; Classification:   Interdisciplinary ; Code:   [id number] ; Last Updated:   06/25/2017 2:38 EDT ; Life Cycle Date:   06/25/2017 ; Life Cycle Status:   Active ; Vocabulary:   SNOMED CT   ; Comments:        06/25/2017 2:38 - SYSTEM,  SYSTEM  Problem added by Discern Expert      HLD (hyperlipidemia) (SNOMED CT  :13086578 )  Name of Problem:   HLD (hyperlipidemia) ; Recorder:   BELLEW, RN, KRISTY M; Confirmation:   Confirmed ; Classification:   Patient Stated ; Code:   [id number] ; Contributor System:   PowerChart ; Last Updated:   06/24/2017 16:16 EDT ; Life Cycle Date:   06/24/2017 ; Life Cycle Status:   Active ; Vocabulary:   SNOMED CT        HTN (hypertension) (SNOMED CT  :8175412217 )  Name of Problem:   HTN (hypertension) ; Recorder:   Daphine Deutscher, RN, Apolinar Junes; Confirmation:   Confirmed ; Classification:   Medical ; Code:   [phone] ; Contributor System:   PowerChart ; Last Updated:   10/28/2015 8:22 EDT ; Life Cycle Date:   10/28/2015 ; Life Cycle Status:   Active ; Vocabulary:   SNOMED CT        No contraindication to deep vein thrombosis (DVT) prophylaxis (SNOMED CT  :220254 )  Name of Problem:   No contraindication to deep vein thrombosis (DVT) prophylaxis ; Recorder:    THOMPSON,  THEODORE TELL-MD; Confirmation:   Confirmed ; Classification:   Medical ; Code:   270623 ; Contributor System:   PowerChart ; Last Updated:   12/28/2017 18:29 EDT ; Life Cycle Status:   Active ; Responsible Provider:   [employer] [facility]; Vocabulary:   SNOMED CT          Diagnoses(Active)    [facility] - Hallucinations/Delusions  Date:   08/30/2020 ; Diagnosis Type:   Reason For Visit ; Confirmation:   Complaint of ; Clinical Dx:   [facility] - Hallucinations/Delusions ; Classification:   Medical ; Clinical Service:   Emergency medicine ; Code:   PNED ; Probability:   0 ; Diagnosis Code:   [id number]             -    Procedure History   (As Of: 08/30/2020 23:22:24 EDT)     Anesthesia Minutes:   0 ; Procedure Name:   Exploratory laparotomy ; Procedure Minutes:   0 ; Comments:  12/28/2017 18:07 EDT - [name], RN, [place] Dec  Suspected fibroids, but did not find any.            [facility] Fall Risk Assessment Tool   Hx of falling last 3 months ED Fall :   No   Patient confused or disoriented ED Fall :   No   Patient intoxicated or sedated ED Fall :   No   Patient impaired gait ED Fall :   No   Use a mobility assistance device ED Fall :   No   Patient altered elimination ED Fall :   No   [facility] ED Fall Score :   0    Jackey Loge - 08/30/2020 23:21 EDT   ED Advance Directive   Advance Directive :   Yes   Jackey Loge - 08/30/2020 23:21 EDT   Palliative Care   Does the Patient have a Life Limiting Illness :   None of the above   Jackey Loge - 08/30/2020 23:21 EDT   Med Hx   Medication List   (As Of: 08/30/2020 23:22:24 EDT)   Prescription/Discharge Order    lisinopril  :   lisinopril ; Status:   Prescribed ; Ordered As Mnemonic:   lisinopril 20 mg oral tablet ; Simple Display Line:   20 mg, 1 tabs, Oral, Daily, 30 tabs, 0 Refill(s) ; Ordering Provider:   Camillia Herter; Catalog Code:   lisinopril ; Order Dt/Tm:   12/29/2017 13:10:59 EDT            Home Meds    furosemide  :    furosemide ; Status:   Documented ; Ordered As Mnemonic:   Lasix ; Simple Display Line:   10 mg, Daily, 0 Refill(s) ; Catalog Code:   furosemide ; Order Dt/Tm:   12/28/2017 12:08:27 EDT          amLODIPine  :   amLODIPine ; Status:   Documented ; Ordered As Mnemonic:   Norvasc 10 mg oral tablet ; Simple Display Line:   10 mg, 1 tabs, Oral, Daily, 0 Refill(s) ; Catalog Code:   amLODIPine ; Order Dt/Tm:   06/25/2017 12:51:45 EDT          aspirin  :   aspirin ; Status:   Documented ; Ordered As Mnemonic:   aspirin 81 mg oral tablet ; Simple Display Line:   81 mg, 1 tabs, Oral, Daily, 0 Refill(s) ; Catalog Code:   aspirin ; Order Dt/Tm:   06/25/2017 12:54:01 EDT          multivitamin with minerals  :   multivitamin with minerals ; Status:   Documented ; Ordered As Mnemonic:   One-A-Day Women 50 Plus oral tablet ; Simple Display Line:   1 tabs, Oral, Daily, 30 tabs, 0 Refill(s) ; Catalog Code:   multivitamin with minerals ; Order Dt/Tm:   06/25/2017 12:54:24 EDT          atorvastatin  :   atorvastatin ; Status:   Documented ; Ordered As Mnemonic:   atorvastatin 40 mg oral tablet ; Simple Display Line:   40 mg, 1 tabs, Oral, Daily, 0 Refill(s) ; Ordering Provider:   Nadean Corwin; Catalog Code:   atorvastatin ; Order Dt/Tm:   10/28/2015 08:23:11 EDT

## 2020-08-30 NOTE — ED Notes (Signed)
ED Pre-Arrival Note        Pre-Arrival Summary    Name:  CC 38,    Current Date:  08/30/2020 23:15:28 EDT  Gender:  Female  Date of Birth:    Age:  87  Pre-Arrival Type:  EMS  ETA:  08/30/2020 23:26:00 EDT  Primary Care Physician:    Presenting Problem:  UTI?  Pre-Arrival User:  Donnal Debar, RN, SCOTT A  Referring Source:    Location:  [facility]            PreArrival Communication Form  Emergency Department        Additional Patient Information:        Orders:  [    ] CBC                                            [     ] CT Head no contrast  [    ] BMP                                           [     ] CT Abdomen/Pelvis no contrast  [    ] PT/INR                                       [     ] CT Abdomen/Pelvis IV contrast, w/ oral contrast  [    ] Troponin                                   [     ] CT Abdomen/Pelvis IV contrast, no oral contrast  [    ] BNP                                            [     ] See ordersheet  [    ] CXR                                             [     ] Other:__________________________  [    ] EKG

## 2020-08-30 NOTE — ED Provider Notes (Signed)
Altered Mental Status *ED        Patient:   Allison [name], Allison [name]            MRN: [id number]            FIN: [id number]               Age:   87 years     Sex:  Female     DOB:  April 04, 1929   Associated Diagnoses:   UTI (urinary tract infection), bacterial; Altered mental state   Author:   Scherrie November,  Jakyri Brunkhorst-MD      Basic Information   Additional information: Chief Complaint from Nursing Triage Note   Chief Complaint  Chief Complaint: Pt pressed medical alarm.  Was having hallicunations. Pt reports frequent urination (08/30/20 23:06:00).      History of Present Illness   The patient presents with Gradual moderate altered mental status no associated fever chills family is concerned about urinary tract infection patient had a medical alert button stating that she thought people were in the yard banging on the windows she had similar episodes when having a urinary tract infection in the past no radiation patient without complaints currently symptoms occurred over the last 1 day.        Review of Systems   Constitutional symptoms:  No fever,    Skin symptoms:  No petechiae,    Respiratory symptoms:  No hemoptysis,    Psychiatric symptoms:  no si  .             Additional review of systems information: Other pertinent  systems reveiwed as negative  except as documented in the HPI.  Marland Kitchen      Health Status   Allergies:    Allergic Reactions (Selected)  Severe  Shellfish- Facial swelling.  Moderate  Chocolate- No reactions were documented.  Peaches- No reactions were documented..   Medications:  (Selected)   Prescriptions  Prescribed  lisinopril 20 mg oral tablet: 20 mg, 1 tabs, Oral, Daily, 30 tabs, 0 Refill(s)  Documented Medications  Documented  Lasix: 10 mg, Daily, 0 Refill(s)  Norvasc 10 mg oral tablet: 10 mg, 1 tabs, Oral, Daily, 0 Refill(s)  One-A-Day Women 50 Plus oral tablet: 1 tabs, Oral, Daily, 30 tabs, 0 Refill(s)  aspirin 81 mg oral tablet: 81 mg, 1 tabs, Oral, Daily, 0 Refill(s)  atorvastatin 40 mg oral tablet: 40 mg, 1 tabs,  Oral, Daily, 0 Refill(s).      Past Medical/ Family/ Social History   Problem list:    Active Problems (4)  At risk for falls   HLD (hyperlipidemia)   HTN (hypertension)   No contraindication to deep vein thrombosis (DVT) prophylaxis   .      Physical Examination               Vital Signs   Vital Signs   08/30/2020 23:22 EDT Peripheral Pulse Rate 60 bpm    Heart Rate Monitored 62 bpm    Mean Arterial Pressure, Cuff 129 mmHg  HI    SpO2 97 %   0/17/4944 96:75 EDT Systolic Blood Pressure 916 mmHg  HI    Diastolic Blood Pressure 83 mmHg    Temperature Oral 37.1 degC    Heart Rate Monitored 59 bpm  LOW    SpO2 98 %   .   Measurements   08/30/2020 23:09 EDT Body Mass Index est meas 25.24 kg/m2    Body Mass Index Measured 25.24 kg/m2  08/30/2020 23:06 EDT Height/Length Measured 167 cm    Weight Dosing 70.4 kg   .   Basic Oxygen Information   08/30/2020 23:22 EDT SpO2 97 %   08/30/2020 23:12 EDT SpO2 98 %   .   General:  No acute distress.   Skin:  No pallor.   Eye:  Vision grossly normal.   Ears, nose, mouth and throat:  Oral mucosa moist.   Cardiovascular:  Normal peripheral perfusion.   Respiratory:  no respiratory distress .   Neurological:  Normal speech observed, Glasgow coma scale: 5-5 strength upper lower extremities 2+ radial pulses bilaterally dry mucous membranes are noted patient is otherwise well-appearing oriented to person place situation no facial asymmetry tongue and uvula midline no active hallucinations.    Psychiatric:  Appropriate mood & affect, non-suicidal.       Medical Decision Making   Rationale:  Altered mental status secondary to urinary tract infection we will give a dose of ceftriaxone.  She is not showing signs of overt sepsis or bacteremia hemodynamically stable patient's mental status is significantly improved she has no focal deficits to suggest intracranial pathology as such she was discharged stable condition.   Documents reviewed:  Emergency department nurses' notes, flowsheet, emergency  department records, prior records, vital signs.    Results review:  Lab results : Lab View   08/31/2020 0:29 EDT UA Color Yellow    UA Appear Cloudy    UA Glucose Negative    UA Bili Negative    UA Ketones Trace    UA Spec Grav 1.025    UA Blood Negative    UA pH 6.0    Protein U Trace    UA Urobilinogen 1.0 EU/dL    UA Nitrite Positive    UA Leuk Est Moderate    WBC U 51-100 /HPF    RBC U 3-5 /HPF    Sq Epi U Moderate /LPF    Bacteria U Many   08/31/2020 0:24 EDT Estimated Creatinine Clearance 37.78 mL/min   08/31/2020 0:03 EDT WBC 7.1 x10e3/mcL    RBC 4.38 x10e6/mcL    Hgb 12.3 g/dL    HCT 35.4 %    MCV 80.8 fL  LOW    MCH 28.1 pg    MCHC 34.7 g/dL    RDW 13.2 %    Platelet 147 x10e3/mcL    MPV 10.8 fL    Neutro Auto 52.9 %    Neutro Absolute 3.8 x10e3/mcL    Immature Grans Percent 0.3 %    Immature Grans Absolute 0.02 x10e3/mcL    Lymph Auto 34.9 %    Lymph Absolute 2.5 x10e3/mcL    Mono Auto 9.3 %    Mono Absolute 0.7 x10e3/mcL    Eosinophil Percent 2.0 %    Eos Absolute 0.1 x10e3/mcL    Basophil Auto 0.6 %    Baso Absolute 0.0 x10e3/mcL    NRBC Absolute Auto 0.000 x10e3/mcL    NRBC Percent Auto 0.0 %    Sodium Lvl 142 mmol/L    Potassium Lvl 3.5 mmol/L    Chloride 105 mmol/L    CO2 29 mmol/L    Glucose Random 147 mg/dL  HI    BUN 16 mg/dL    Creatinine Lvl 1.1 mg/dL  HI    AGAP 9 mmol/L    Osmolality Calc 287 mOsm/kg    Calcium Lvl 8.9 mg/dL    Protein Total 6.6 g/dL    Albumin Lvl 3.7 g/dL  Globulin Calc 2.9 g/dL    AG Ratio Calc 1.28 mmol/L    Alk Phos 56 unit/L    AST 29 unit/L    ALT 33 unit/L    eGFR AA 51 mL/min/1.89m???  LOW    eGFR Non-AA 44 mL/min/1.765m??  LOW    Bili Total 0.20 mg/dL   08/30/2020 23:09 EDT Estimated Creatinine Clearance 59.37 mL/min   .      Reexamination/ Reevaluation   Vital signs   Basic Oxygen Information   08/30/2020 23:22 EDT SpO2 97 %   08/30/2020 23:12 EDT SpO2 98 %         Impression and Plan   Diagnosis   UTI (urinary tract infection), bacterial (ICD10-CM N39.0, Discharge,  Medical)   Altered mental state (ICD10-CM R41.82, Discharge, Medical)   Plan   Condition: Improved.    Disposition: Medically cleared, Discharged: to home.    Prescriptions: Launch prescriptions   Pharmacy:  lactobacillus acidophilus oral capsule (Prescribe): 1 tabs, Oral, Daily, for 30 days, 30 tabs, 0 Refill(s)  Keflex 500 mg oral capsule (Prescribe): 500 mg, 1 caps, Oral, QID, for 7 days, 28 caps, 0 Refill(s).    Patient was given the following educational materials: Urinary Tract Infection, Adult, Confusion.    Follow up with: Follow up with primary care provider Within 1 to 2 days If need assistance in establishing a primary care doctor call [phone]   Notes: The patient was counseled at length by the physician and nursing staff on the disposition  plans well as  any pertinent laboratory and radiology findings  were reviewed with the patient and or family.  They  were informed that there results  may be preliminary and that any  discrepencies  would conveyed  to them via  the contact number they  provided to the registration staff.  . Designer, industrial/productigned on 08/31/2020 02:08 AM EDT   ________________________________________________   DeElayne Guerin             Modified by: DeScherrie November Fleur Audino-MD on 08/31/2020 02:06 AM EDT      Modified by: Scherrie November Ravonda Brecheen-MD on 08/31/2020 02:08 AM EDT

## 2020-08-30 NOTE — ED Notes (Signed)
ED Triage Note       ED Triage Adult Entered On:  08/30/2020 23:09 EDT    Performed On:  08/30/2020 23:06 EDT by Jackey Loge               Triage   Numeric Rating Pain Scale :   0 = No pain   Chief Complaint :   Pt pressed medical alarm.  Was having hallicunations. Pt reports frequent urination    Chief Complaint Onset :   08/30/2020 23:07 EDT   [place] Mode of Arrival :   Ambulance   Infectious Disease Documentation :   Document assessment   Patient presentation :   None of the above   Chief Complaint or Presentation suggest infection :   No   Weight Dosing :   70.4 kg(Converted to: 155 lb 3 oz)    Height :   167 cm(Converted to: 5 ft 6 in)    Body Mass Index Dosing :   25 kg/m2   Jackey Loge - 08/30/2020 23:06 EDT   DCP GENERIC CODE   Tracking Acuity :   3   Tracking Group :   [id number] [employer] [employer]   Jackey Loge - 08/30/2020 23:06 EDT   ED General Section :   Document assessment   Pregnancy Status :   N/A   ED Allergies Section :   Document assessment   ED Reason for Visit Section :   Document assessment   Jackey Loge - 08/30/2020 23:06 EDT   PTA/Triage Treatments   ED PTA Pre-Arrival Service :   [facility]   Jackey Loge - 08/30/2020 23:06 EDT   ID Risk Screen Symptoms   Recent Travel History :   No recent travel   Jackey Loge - 08/30/2020 23:06 EDT   Allergies   (As Of: 08/30/2020 23:09:29 EDT)   Allergies (Active)   Chocolate  Estimated Onset Date:   Unspecified ; Created By:   Judi Saa RN, KRISTINA A; Reaction Status:   Active ; Category:   Drug ; Substance:   Chocolate ; Type:   Allergy ; Severity:   Moderate ; Updated By:   Judi Saa RN, Anabel Bene; Reviewed Date:   05/06/2020 19:50 EST      Peaches  Estimated Onset Date:   Unspecified ; Created By:   Judi Saa RN, KRISTINA A; Reaction Status:   Active ; Category:   Drug ; Substance:   Peaches ; Type:   Allergy ; Severity:   Moderate ; Updated By:   Judi Saa RN, Anabel Bene; Reviewed Date:   05/06/2020 19:50 EST      shellfish   Estimated Onset Date:   Unspecified ; Reactions:   facial swelling ; Created By:   Jennet Maduro, RN, Ciara; Reaction Status:   Active ; Category:   Drug ; Substance:   shellfish ; Type:   Allergy ; Severity:   Severe ; Updated By:   Jennet Maduro, RN, Ciara; Reviewed Date:   05/06/2020 19:50 EST        Psycho-Social   Last 3 mo, thoughts killing self/others :   Patient denies   Right click within box for Suspected Abuse policy link. :   None   Feels Safe Where Live :   Yes   Jackey Loge - 08/30/2020 23:06 EDT   ED Reason for Visit   (As Of: 08/30/2020 23:09:29 EDT)   Problems(Active)    At risk for falls (SNOMED  CT  :416606301 )  Name of Problem:   At risk for falls ; Onset Date:   06/25/2017 ; Recorder:   SYSTEM,  SYSTEM; Confirmation:   Confirmed ; Classification:   Interdisciplinary ; Code:   [id number] ; Last Updated:   06/25/2017 2:38 EDT ; Life Cycle Date:   06/25/2017 ; Life Cycle Status:   Active ; Vocabulary:   SNOMED CT   ; Comments:        06/25/2017 2:38 - SYSTEM,  SYSTEM  Problem added by Discern Expert      HLD (hyperlipidemia) (SNOMED CT  :57322025 )  Name of Problem:   HLD (hyperlipidemia) ; Recorder:   BELLEW, RN, KRISTY M; Confirmation:   Confirmed ; Classification:   Patient Stated ; Code:   42706237 ; Contributor System:   PowerChart ; Last Updated:   06/24/2017 16:16 EDT ; Life Cycle Date:   06/24/2017 ; Life Cycle Status:   Active ; Vocabulary:   SNOMED CT        HTN (hypertension) (SNOMED CT  :[phone] )  Name of Problem:   HTN (hypertension) ; Recorder:   Daphine Deutscher, RN, Apolinar Junes; Confirmation:   Confirmed ; Classification:   Medical ; Code:   [id number] ; Contributor System:   PowerChart ; Last Updated:   10/28/2015 8:22 EDT ; Life Cycle Date:   10/28/2015 ; Life Cycle Status:   Active ; Vocabulary:   SNOMED CT        No contraindication to deep vein thrombosis (DVT) prophylaxis (SNOMED CT  :778242 )  Name of Problem:   No contraindication to deep vein  thrombosis (DVT) prophylaxis ; Recorder:   THOMPSON,  THEODORE TELL-MD; Confirmation:   Confirmed ; Classification:   Medical ; Code:   353614 ; Contributor System:   PowerChart ; Last Updated:   12/28/2017 18:29 EDT ; Life Cycle Status:   Active ; Responsible Provider:   [employer] [facility]; Vocabulary:   SNOMED CT          Diagnoses(Active)    [facility] - Hallucinations/Delusions  Date:   08/30/2020 ; Diagnosis Type:   Reason For Visit ; Confirmation:   Complaint of ; Clinical Dx:   UC - Hallucinations/Delusions ; Classification:   Medical ; Clinical Service:   Emergency medicine ; Code:   PNED ; Probability:   0 ; Diagnosis Code:   [id number]

## 2020-08-31 LAB — CBC WITH AUTO DIFFERENTIAL
Absolute Baso #: 0 10*3/uL (ref 0.0–0.2)
Absolute Eos #: 0.1 10*3/uL (ref 0.0–0.5)
Absolute Lymph #: 2.5 10*3/uL (ref 1.0–3.2)
Absolute Mono #: 0.7 10*3/uL (ref 0.3–1.0)
Basophils %: 0.6 % (ref 0.0–2.0)
Eosinophils %: 2 % (ref 0.0–7.0)
Hematocrit: 35.4 % (ref 34.0–47.0)
Hemoglobin: 12.3 g/dL (ref 11.5–15.7)
Immature Grans (Abs): 0.02 10*3/uL (ref 0.00–0.06)
Immature Granulocytes: 0.3 % (ref 0.0–0.6)
Lymphocytes: 34.9 % (ref 15.0–45.0)
MCH: 28.1 pg (ref 27.0–34.5)
MCHC: 34.7 g/dL (ref 32.0–36.0)
MCV: 80.8 fL — ABNORMAL LOW (ref 81.0–99.0)
MPV: 10.8 fL (ref 7.2–13.2)
Monocytes: 9.3 % (ref 4.0–12.0)
NRBC Absolute: 0 10*3/uL (ref 0.000–0.012)
NRBC Automated: 0 % (ref 0.0–0.2)
Neutrophils %: 52.9 % (ref 42.0–74.0)
Neutrophils Absolute: 3.8 10*3/uL (ref 1.6–7.3)
Platelets: 147 10*3/uL (ref 140–440)
RBC: 4.38 x10e6/mcL (ref 3.60–5.20)
RDW: 13.2 % (ref 11.0–16.0)
WBC: 7.1 10*3/uL (ref 3.8–10.6)

## 2020-08-31 LAB — COMPREHENSIVE METABOLIC PANEL
ALT: 33 U/L (ref 0–35)
AST: 29 U/L (ref 0–35)
Albumin/Globulin Ratio: 1.28 mmol/L (ref 1.00–2.70)
Albumin: 3.7 g/dL (ref 3.5–5.2)
Alk Phosphatase: 56 U/L (ref 35–117)
Anion Gap: 9 mmol/L (ref 2–17)
BUN: 16 mg/dL (ref 8–23)
CO2: 29 mmol/L (ref 22–29)
Calcium: 8.9 mg/dL (ref 8.8–10.2)
Chloride: 105 mmol/L (ref 98–107)
Creatinine: 1.1 mg/dL — ABNORMAL HIGH (ref 0.5–1.0)
GFR African American: 51 mL/min/{1.73_m2} — ABNORMAL LOW (ref >=90)
GFR Non-African American: 44 mL/min/{1.73_m2} — ABNORMAL LOW (ref >=90)
Globulin: 2.9 g/dL (ref 1.9–4.4)
Glucose: 147 mg/dL — ABNORMAL HIGH (ref 70–99)
OSMOLALITY CALCULATED: 287 mOsm/kg (ref 270–287)
Potassium: 3.5 mmol/L (ref 3.5–5.3)
Sodium: 142 mmol/L (ref 135–145)
Total Bilirubin: 0.2 mg/dL (ref 0.00–1.20)
Total Protein: 6.6 g/dL (ref 6.4–8.3)

## 2020-08-31 LAB — URINALYSIS W/ RFLX MICROSCOPIC
Bilirubin Urine: NEGATIVE
Blood, Urine: NEGATIVE
Glucose, UA: NEGATIVE mg/dL
Nitrite, Urine: POSITIVE — AB
Specific Gravity, UA: 1.025 (ref 1.003–1.035)
Urobilinogen, Urine: 1 EU/dL
pH, UA: 6 (ref 4.5–8.0)

## 2020-08-31 LAB — MICROSCOPIC URINALYSIS

## 2020-08-31 NOTE — ED Notes (Signed)
 ED Patient Summary       ;       [facility] Emergency Department  [address], [place] [address]  [phone]  Discharge Instructions (Patient)  Name: Allison [name], Allison [name]  DOB: 05-21-1928                   MRN: [id number]                   FIN: [id number]  Reason For Visit: UC - Hallucinations/Delusions; EMS-UTI  Final Diagnosis: Altered mental state; UTI (urinary tract infection), bacterial     Visit Date: 08/30/2020 23:04:00  Address: [address] [place] [address]  Phone: [phone]     Emergency Department Providers:         Primary Physician:      DANTON DESIDERIO [facility] would like to thank you for allowing us  to assist you with your healthcare needs. The following includes patient education materials and information regarding your injury/illness.     Follow-up Instructions:  You were seen today on an emergency basis. Please contact your primary care doctor for a follow up appointment. If you received a referral to a specialist doctor, it is important you follow-up as instructed.    It is important that you call your follow-up doctor to schedule and confirm the location of your next appointment. Your doctor may practice at multiple locations. The office location of your follow-up appointment may be different to the one written on your discharge instructions.    If you do not have a primary care doctor, please call [phone] for help in finding a [name] Provider. For help in finding a specialist doctor, please call [phone].    If your condition gets worse before your follow-up with your primary care doctor or specialist, please return to the Emergency Department.      Coronavirus 2019 (COVID-19) Reminders:     Patients age 58 - 14, with parental consent, and patients over age 80 can make an appointment for a COVID-19 vaccine. Patients can contact their [facility] doctors' offices to schedule an appointment  to receive the COVID-19 vaccine. Patients who do not have a [facility] physician can call [phone] to schedule vaccination appointments.      Follow Up Appointments:  Primary Care Provider:     Name: FRANKLIN,  CHERI L-MD     Phone: [phone]                 With: Address: When:   Follow up with primary care provider  Within 1 to 2 days   Comments:   If need assistance in establishing a primary care doctor call [phone]              [facility] SERVICES%>          New Medications  Printed Prescriptions  cephalexin (Keflex 500 mg oral capsule) 1 Capsules Oral (given by mouth) 4 times a day for 7 Days. Refills: 0.  Last Dose:____________________  lactobacillus acidophilus (lactobacillus acidophilus oral capsule) 1 Tabs Oral (given by mouth) every day for 30 Days. Refills: 0.  Last Dose:____________________  Medications that have not changed  Other Medications  amLODIPine  (Norvasc  10 mg oral tablet) 1 Tabs Oral (given by mouth) every day.  Last Dose:____________________  aspirin  (aspirin  81 mg oral tablet) 81 Milligram Oral (given  by mouth) every day.  Last Dose:____________________  atorvastatin (atorvastatin 40 mg oral tablet) 1 Tabs Oral (given by mouth) every day.  Last Dose:____________________  furosemide  (Lasix ) 10 Milligram every day.  Last Dose:____________________  lisinopril  (lisinopril  20 mg oral tablet) 1 Tabs Oral (given by mouth) every day. Refills: 0.  Last Dose:____________________  multivitamin with minerals (One-A-Day Women 50 Plus oral tablet) 1 Tabs Oral (given by mouth) every day.  Last Dose:____________________      Allergy Info: Peaches; Chocolate; shellfish     Discharge Additional Information          Discharge Patient 08/31/20 2:09:00 EDT      Patient Education Materials:        Confusion    Confusion is the inability to think with your usual speed or clarity. Confusion can be caused by many things. People who are confused often describe their  thinking as cloudy or unclear. Confusion can also include feeling disoriented. This means you are unaware of where you are or who you are. You may also not know the date or time. When confused, you may have trouble remembering, paying attention, or making decisions. Some people also act aggressively when they are confused.    In some cases, confusion may come on quickly. In other cases, it may develop slowly over time.    Confusion may be caused by medical conditions such as:   Infections, such as a urinary tract infection (UTI).     Low levels of oxygen, which can develop from conditions such as long-term lung disorders.     Decrease in brain function due to dementia and other conditions that affect the brain, such as seizures, strokes, brain tumors, or head injuries.     Mental health conditions, like panic attacks, anxiety, depression, and hallucinations.      Confusion may also be caused by physical factors such as:   Loss of fluid (dehydration) or an imbalance of salts and minerals in the body (electrolytes).     Lack of certain nutrients like niacin, thiamine, or other B vitamins.     Fever or hypothermia, which is a sudden drop in body temperature.     Low or high blood sugar.     Low or high blood pressure.      Other causes include:   Lack of sleep or changes in routine or surroundings, such as when traveling or staying in a hospital.     Using too much alcohol, drugs, or medicine.     Side effects of medicines, or taking medicines that affect other medicines (drug interactions).        Follow these instructions at home:    Pay attention to your symptoms. Tell your health care provider about any changes or if you develop new symptoms. Follow these instructions to control or treat symptoms. Ask a family member or friend for help if needed.    Medicines       Take over-the-counter and prescription medicines only as told by your health care provider.     Ask your health care provider about changing or stopping  any medicines that may be causing your confusion.     Avoid pain medicines or sleep medicines until you have fully recovered.     Use a pillbox or an alarm to help you take the right medicines at the right time.      Lifestyle       Eat a balanced diet that includes fruits and vegetables.  Get enough sleep. For most adults, this is 7?9 hours each night.     Do not drink alcohol.     Do not become isolated. Spend time with other people and make plans for your days.     Do not drive until your health care provider says that it is safe to do so.     Do not use any products that contain nicotine or tobacco, such as cigarettes, e-cigarettes, and chewing tobacco. If you need help quitting, ask your health care provider.     Stop other activities that may increase your chances of getting hurt. These may include some work duties, sports activities, swimming, or bike riding. Ask your health care provider what activities are safe for you.      Tips for caregivers     Find out if the person is confused. Ask the person to state his or her name, age, and the date. If the person is unsure or answers incorrectly, he or she may be confused and need assistance.     Always introduce yourself, no matter how well the person knows you. Remind the person of his or her location.     Place a calendar and clock near the person who is confused. Keep a regular schedule. Make sure the person has plenty of light during the day and sleep at night.     Talk about current events and plans for the day.     Keep the environment calm, quiet, and peaceful.     Help the person do the things that he or she is unable to do. These include:  ? Taking medicines.     ? Keeping medical appointments.    ? Helping with household duties, including meal preparation.    ? Running errands.       Get help if you need it. There are several support groups for caregivers. If the person you are helping needs more support, consider day care, extended-care programs, or  a skilled nursing facility. The person's health care provider may be able to help evaluate these options.      General instructions     Monitor yourself for any conditions you may have. These can include:  ? Checking your blood glucose levels if you have diabetes.    ? Maintaining a healthy weight.    ? Monitoring your blood pressure if you have hypertension.    ? Monitoring your body temperature if you have a fever.       Keep all follow-up visits. This is important.        Contact a health care provider if:     You have new symptoms or your symptoms get worse.      Get help right away if you:     Feel that you are not able to care for yourself.     Develop severe headaches, repeated vomiting, seizures, blackouts, or slurred speech.     Have increasing confusion, weakness, numbness, restlessness, or personality changes.     Develop a loss of balance, have marked dizziness, feel uncoordinated, or fall.     Develop severe anxiety, or you have delusions or hallucinations.    These symptoms may represent a serious problem that is an emergency. Do not wait to see if the symptoms will go away. Get medical help right away. Call your local emergency services (911 in the [place] Do not drive yourself to the hospital.      Summary  Confusion is the inability to think with your usual speed or clarity. People who are confused often describe their thinking as cloudy or unclear.     Confusion can also include having trouble remembering, paying attention, or making decisions.     Confusion may come on quickly or develop slowly over time, depending on the cause. There are many different causes of confusion.     Ask for help from family members or friends if you are unable to take care of yourself.      This information is not intended to replace advice given to you by your health care provider. Make sure you discuss any questions you have with your health care provider.      Document Revised: 07/26/2019 Document Reviewed:  07/26/2019  Elsevier Patient Education ? 2021 Elsevier Inc[employer]       Urinary Tract Infection, Adult      A urinary tract infection (UTI) is an infection of any part of the urinary tract. The urinary tract includes the kidneys, ureters, bladder, and urethra. These organs make, store, and get rid of urine in the body.    Your health care provider may use other names to describe the infection. An upper UTI affects the ureters and kidneys (pyelonephritis). A lower UTI affects the bladder (cystitis) and urethra (urethritis).      What are the causes?    Most urinary tract infections are caused by bacteria in your genital area, around the entrance to your urinary tract (urethra). These bacteria grow and cause inflammation of your urinary tract.      What increases the risk?    You are more likely to develop this condition if:   You have a urinary catheter that stays in place (indwelling).     You are not able to control when you urinate or have a bowel movement (you have incontinence).     You are female and you:  ? Use a spermicide or diaphragm for birth control.    ? Have low estrogen levels.    ? Are pregnant.       You have certain genes that increase your risk (genetics).     You are sexually active.     You take antibiotic medicines.     You have a condition that causes your flow of urine to slow down, such as:  ? An enlarged prostate, if you are female.    ? Blockage in your urethra (stricture).    ? A kidney stone.    ? A nerve condition that affects your bladder control (neurogenic bladder).    ? Not getting enough to drink, or not urinating often.       You have certain medical conditions, such as:  ? Diabetes.    ? A weak disease-fighting system (immunesystem).    ? Sickle cell disease.    ? Gout.    ? Spinal cord injury.          What are the signs or symptoms?    Symptoms of this condition include:   Needing to urinate right away (urgently).     Frequent urination or passing small amounts of urine frequently.      Pain or burning with urination.     Blood in the urine.     Urine that smells bad or unusual.     Trouble urinating.     Cloudy urine.     Vaginal discharge, if you are female.     Pain  in the abdomen or the lower back.      You may also have:   Vomiting or a decreased appetite.     Confusion.     Irritability or tiredness.     A fever.     Diarrhea.      The first symptom in older adults may be confusion. In some cases, they may not have any symptoms until the infection has worsened.      How is this diagnosed?    This condition is diagnosed based on your medical history and a physical exam. You may also have other tests, including:   Urine tests.     Blood tests.     Tests for sexually transmitted infections (STIs).      If you have had more than one UTI, a cystoscopy or imaging studies may be done to determine the cause of the infections.      How is this treated?    Treatment for this condition includes:   Antibiotic medicine.     Over-the-counter medicines to treat discomfort.     Drinking enough water to stay hydrated.      If you have frequent infections or have other conditions such as a kidney stone, you may need to see a health care provider who specializes in the urinary tract (urologist).    In rare cases, urinary tract infections can cause sepsis. Sepsis is a life-threatening condition that occurs when the body responds to an infection. Sepsis is treated in the hospital with IV antibiotics, fluids, and other medicines.      Follow these instructions at home:      Medicines     Take over-the-counter and prescription medicines only as told by your health care provider.     If you were prescribed an antibiotic medicine, take it as told by your health care provider. Do not stop using the antibiotic even if you start to feel better.      General instructions     Make sure you:  ? Empty your bladder often and completely. Do not hold urine for long periods of time.    ? Empty your bladder after sex.    ? Wipe  from front to back after a bowel movement if you are female. Use each tissue one time when you wipe.       Drink enough fluid to keep your urine pale yellow.     Keep all follow-up visits as told by your health care provider. This is important.        Contact a health care provider if:     Your symptoms do not get better after 1?2 days.     Your symptoms go away and then return.      Get help right away if you have:     Severe pain in your back or your lower abdomen.     A fever.     Nausea or vomiting.      Summary     A urinary tract infection (UTI) is an infection of any part of the urinary tract, which includes the kidneys, ureters, bladder, and urethra.     Most urinary tract infections are caused by bacteria in your genital area, around the entrance to your urinary tract (urethra).     Treatment for this condition often includes antibiotic medicines.     If you were prescribed an antibiotic medicine, take it as told by your health care provider. Do not stop  using the antibiotic even if you start to feel better.     Keep all follow-up visits as told by your health care provider. This is important.      This information is not intended to replace advice given to you by your health care provider. Make sure you discuss any questions you have with your health care provider.      Document Revised: 03/18/2018 Document Reviewed: 10/08/2017  Elsevier Patient Education ? 2021 Elsevier Inc.      ---------------------------------------------------------------------------------------------------------------------  [facility] allows patients to review your COVID and other test results as well as discharge documents from any [name], Emergency Department, surgical center or outpatient lab. Test results are typically available 36 hours after the test is completed.     [name] encourages you to self-enroll in the [facility] Patient Portal.     To begin your  self-enrollment process, please visit [email or web address] Under [facility], click on "Sign up now".     NOTE: You must be 16 years and older to use [facility] Self-Enroll online. If you are a parent, caregiver, or guardian; you need an invite to access your child's or dependent's health records. To obtain an invite, contact the Medical Records department at [phone] Monday through Friday, 8-4:30, select option 3 . If we receive your call afterhours, we will return your call the next business day.     If you have issues trying to create or access your account, contact [employer] support at [phone] available 7 days a week 24 hours a day.     Comment:

## 2020-08-31 NOTE — ED Notes (Signed)
ED Results Callback Log     Urine Culture  Collected: 08/31/2020 EC  Complete Body site:   Specimen Type: U Cath 09/02/2020 08:55      09/02/2020 10:04 Luisa Hart, RN, Roberta L) No further action required positive urine culture, sensitive to Cefazolin, prescribed Keflex.

## 2020-08-31 NOTE — ED Notes (Signed)
ED Patient Education Note     Patient Education Materials Follows:  Mental and Behavioral Health     Confusion    Confusion is the inability to think with your usual speed or clarity. Confusion can be caused by many things. People who are confused often describe their thinking as cloudy or unclear. Confusion can also include feeling disoriented. This means you are unaware of where you are or who you are. You may also not know the date or time. When confused, you may have trouble remembering, paying attention, or making decisions. Some people also act aggressively when they are confused.    In some cases, confusion may come on quickly. In other cases, it may develop slowly over time.    Confusion may be caused by medical conditions such as:   Infections, such as a urinary tract infection (UTI).     Low levels of oxygen, which can develop from conditions such as long-term lung disorders.     Decrease in brain function due to dementia and other conditions that affect the brain, such as seizures, strokes, brain tumors, or head injuries.     Mental health conditions, like panic attacks, anxiety, depression, and hallucinations.      Confusion may also be caused by physical factors such as:   Loss of fluid (dehydration) or an imbalance of salts and minerals in the body (electrolytes).     Lack of certain nutrients like niacin, thiamine, or other B vitamins.     Fever or hypothermia, which is a sudden drop in body temperature.     Low or high blood sugar.     Low or high blood pressure.      Other causes include:   Lack of sleep or changes in routine or surroundings, such as when traveling or staying in a hospital.     Using too much alcohol, drugs, or medicine.     Side effects of medicines, or taking medicines that affect other medicines (drug interactions).        Follow these instructions at home:    Pay attention to your symptoms. Tell your health care provider about any changes or if you develop new symptoms. Follow  these instructions to control or treat symptoms. Ask a family member or friend for help if needed.    Medicines       Take over-the-counter and prescription medicines only as told by your health care provider.     Ask your health care provider about changing or stopping any medicines that may be causing your confusion.     Avoid pain medicines or sleep medicines until you have fully recovered.     Use a pillbox or an alarm to help you take the right medicines at the right time.      Lifestyle       Eat a balanced diet that includes fruits and vegetables.     Get enough sleep. For most adults, this is 7?9 hours each night.     Do not drink alcohol.     Do not become isolated. Spend time with other people and make plans for your days.     Do not drive until your health care provider says that it is safe to do so.     Do not use any products that contain nicotine or tobacco, such as cigarettes, e-cigarettes, and chewing tobacco. If you need help quitting, ask your health care provider.     Stop other activities that may increase   your chances of getting hurt. These may include some work duties, sports activities, swimming, or bike riding. Ask your health care provider what activities are safe for you.      Tips for caregivers     Find out if the person is confused. Ask the person to state his or her name, age, and the date. If the person is unsure or answers incorrectly, he or she may be confused and need assistance.     Always introduce yourself, no matter how well the person knows you. Remind the person of his or her location.     Place a calendar and clock near the person who is confused. Keep a regular schedule. Make sure the person has plenty of light during the day and sleep at night.     Talk about current events and plans for the day.     Keep the environment calm, quiet, and peaceful.     Help the person do the things that he or she is unable to do. These include:  ? Taking medicines.     ? Keeping medical  appointments.    ? Helping with household duties, including meal preparation.    ? Running errands.       Get help if you need it. There are several support groups for caregivers. If the person you are helping needs more support, consider day care, extended-care programs, or a skilled nursing facility. The person's health care provider may be able to help evaluate these options.      General instructions     Monitor yourself for any conditions you may have. These can include:  ? Checking your blood glucose levels if you have diabetes.    ? Maintaining a healthy weight.    ? Monitoring your blood pressure if you have hypertension.    ? Monitoring your body temperature if you have a fever.       Keep all follow-up visits. This is important.        Contact a health care provider if:     You have new symptoms or your symptoms get worse.      Get help right away if you:     Feel that you are not able to care for yourself.     Develop severe headaches, repeated vomiting, seizures, blackouts, or slurred speech.     Have increasing confusion, weakness, numbness, restlessness, or personality changes.     Develop a loss of balance, have marked dizziness, feel uncoordinated, or fall.     Develop severe anxiety, or you have delusions or hallucinations.    These symptoms may represent a serious problem that is an emergency. Do not wait to see if the symptoms will go away. Get medical help right away. Call your local emergency services (911 in the [place] Do not drive yourself to the hospital.      Summary     Confusion is the inability to think with your usual speed or clarity. People who are confused often describe their thinking as cloudy or unclear.     Confusion can also include having trouble remembering, paying attention, or making decisions.     Confusion may come on quickly or develop slowly over time, depending on the cause. There are many different causes of confusion.     Ask for help from family members or friends if  you are unable to take care of yourself.      This information is not intended to replace   advice given to you by your health care provider. Make sure you discuss any questions you have with your health care provider.      Document Revised: 07/26/2019 Document Reviewed: 07/26/2019  Elsevier Patient Education ? 2021 [employer]      Obstetrics and Gynecology     Urinary Tract Infection, Adult      A urinary tract infection (UTI) is an infection of any part of the urinary tract. The urinary tract includes the kidneys, ureters, bladder, and urethra. These organs make, store, and get rid of urine in the body.    Your health care provider may use other names to describe the infection. An upper UTI affects the ureters and kidneys (pyelonephritis). A lower UTI affects the bladder (cystitis) and urethra (urethritis).      What are the causes?    Most urinary tract infections are caused by bacteria in your genital area, around the entrance to your urinary tract (urethra). These bacteria grow and cause inflammation of your urinary tract.      What increases the risk?    You are more likely to develop this condition if:   You have a urinary catheter that stays in place (indwelling).     You are not able to control when you urinate or have a bowel movement (you have incontinence).     You are female and you:  ? Use a spermicide or diaphragm for birth control.    ? Have low estrogen levels.    ? Are pregnant.       You have certain genes that increase your risk (genetics).     You are sexually active.     You take antibiotic medicines.     You have a condition that causes your flow of urine to slow down, such as:  ? An enlarged prostate, if you are female.    ? Blockage in your urethra (stricture).    ? A kidney stone.    ? A nerve condition that affects your bladder control (neurogenic bladder).    ? Not getting enough to drink, or not urinating often.       You have certain medical conditions, such as:  ? Diabetes.    ? A weak  disease-fighting system (immunesystem).    ? Sickle cell disease.    ? Gout.    ? Spinal cord injury.          What are the signs or symptoms?    Symptoms of this condition include:   Needing to urinate right away (urgently).     Frequent urination or passing small amounts of urine frequently.     Pain or burning with urination.     Blood in the urine.     Urine that smells bad or unusual.     Trouble urinating.     Cloudy urine.     Vaginal discharge, if you are female.     Pain in the abdomen or the lower back.      You may also have:   Vomiting or a decreased appetite.     Confusion.     Irritability or tiredness.     A fever.     Diarrhea.      The first symptom in older adults may be confusion. In some cases, they may not have any symptoms until the infection has worsened.      How is this diagnosed?    This condition is diagnosed based on your   medical history and a physical exam. You may also have other tests, including:   Urine tests.     Blood tests.     Tests for sexually transmitted infections (STIs).      If you have had more than one UTI, a cystoscopy or imaging studies may be done to determine the cause of the infections.      How is this treated?    Treatment for this condition includes:   Antibiotic medicine.     Over-the-counter medicines to treat discomfort.     Drinking enough water to stay hydrated.      If you have frequent infections or have other conditions such as a kidney stone, you may need to see a health care provider who specializes in the urinary tract (urologist).    In rare cases, urinary tract infections can cause sepsis. Sepsis is a life-threatening condition that occurs when the body responds to an infection. Sepsis is treated in the hospital with IV antibiotics, fluids, and other medicines.      Follow these instructions at home:      Medicines     Take over-the-counter and prescription medicines only as told by your health care provider.     If you were prescribed an antibiotic  medicine, take it as told by your health care provider. Do not stop using the antibiotic even if you start to feel better.      General instructions     Make sure you:  ? Empty your bladder often and completely. Do not hold urine for long periods of time.    ? Empty your bladder after sex.    ? Wipe from front to back after a bowel movement if you are female. Use each tissue one time when you wipe.       Drink enough fluid to keep your urine pale yellow.     Keep all follow-up visits as told by your health care provider. This is important.        Contact a health care provider if:     Your symptoms do not get better after 1?2 days.     Your symptoms go away and then return.      Get help right away if you have:     Severe pain in your back or your lower abdomen.     A fever.     Nausea or vomiting.      Summary     A urinary tract infection (UTI) is an infection of any part of the urinary tract, which includes the kidneys, ureters, bladder, and urethra.     Most urinary tract infections are caused by bacteria in your genital area, around the entrance to your urinary tract (urethra).     Treatment for this condition often includes antibiotic medicines.     If you were prescribed an antibiotic medicine, take it as told by your health care provider. Do not stop using the antibiotic even if you start to feel better.     Keep all follow-up visits as told by your health care provider. This is important.      This information is not intended to replace advice given to you by your health care provider. Make sure you discuss any questions you have with your health care provider.      Document Revised: 03/18/2018 Document Reviewed: 10/08/2017  Elsevier Patient Education ? 2021 Elsevier Inc.

## 2020-08-31 NOTE — Discharge Summary (Signed)
 ED Clinical Summary                     [facility]  [address]  [place], [place], [address]  [phone]          PERSON INFORMATION  Name: Allison [name], Allison [name] Age:  87 Years DOB: [birth year]   Sex: Female Language: English PCP: JOHNIE ENSIGN L-MD   Marital Status: Widowed Phone: [phone] Med Service: JENENE DELLIE Sabot   MRN: [id number] Acct# [id number] Arrival: 08/30/2020 23:04:00   Visit Reason: UC - Hallucinations/Delusions; EMS-UTI Acuity: 3 LOS: 000 04:04   Address:    [address] [place] [address]   Diagnosis:    Altered mental state; UTI (urinary tract infection), bacterial  Medications:          New Medications  Printed Prescriptions  cephalexin (Keflex 500 mg oral capsule) 1 Capsules Oral (given by mouth) 4 times a day for 7 Days. Refills: 0.  Last Dose:____________________  lactobacillus acidophilus (lactobacillus acidophilus oral capsule) 1 Tabs Oral (given by mouth) every day for 30 Days. Refills: 0.  Last Dose:____________________  Medications that have not changed  Other Medications  amLODIPine  (Norvasc  10 mg oral tablet) 1 Tabs Oral (given by mouth) every day.  Last Dose:____________________  aspirin  (aspirin  81 mg oral tablet) 81 Milligram Oral (given by mouth) every day.  Last Dose:____________________  atorvastatin (atorvastatin 40 mg oral tablet) 1 Tabs Oral (given by mouth) every day.  Last Dose:____________________  furosemide  (Lasix ) 10 Milligram every day.  Last Dose:____________________  lisinopril  (lisinopril  20 mg oral tablet) 1 Tabs Oral (given by mouth) every day. Refills: 0.  Last Dose:____________________  multivitamin with minerals (One-A-Day Women 50 Plus oral tablet) 1 Tabs Oral (given by mouth) every day.  Last Dose:____________________      Medications Administered During Visit:                Medication Dose Route   Sodium Chloride  0.9% 1000 mL IV Piggyback   Sodium Chloride  0.9% 1000 mL IV Piggyback   ceftriaxone 1 g IV Piggyback                Allergies      Peaches      Chocolate      shellfish (facial swelling)      Major Tests and Procedures:  The following procedures and tests were performed during your ED visit.  COMMON PROCEDURES%>  COMMON PROCEDURES COMMENTS%>                PROVIDER INFORMATION               Provider Role Assigned Sampson SCHAFFER, WADE-MD ED Provider 08/30/2020 23:10:37    Landy Book ED Nurse 08/30/2020 23:21:02        Attending Physician:  SCHAFFER,  WADE-MD      Admit Doc  DEHNKAMP,  WADE-MD     Consulting Doc       VITALS INFORMATION  Vital Sign Triage Latest   Temp Oral ORAL_1%> ORAL%>   Temp Temporal TEMPORAL_1%> TEMPORAL%>   Temp Intravascular INTRAVASCULAR_1%> INTRAVASCULAR%>   Temp Axillary AXILLARY_1%> AXILLARY%>   Temp Rectal RECTAL_1%> RECTAL%>   02 Sat 98 % 97 %   Respiratory Rate RATE_1%> RATE%>   Peripheral Pulse Rate PULSE RATE_1%>60 bpm PULSE RATE%>   Apical Heart Rate HEART RATE_1%> HEART RATE%>   Blood Pressure BLOOD PRESSURE_1%>/ BLOOD PRESSURE_1%>83 mmHg BLOOD PRESSURE%> / BLOOD PRESSURE%>83  mmHg                 Immunizations      No Immunizations Documented This Visit          DISCHARGE INFORMATION   Discharge Disposition: H Outpt-Sent Home   Discharge Location:  Home   Discharge Date and Time:  08/31/2020 03:08:55   ED Checkout Date and Time:  08/31/2020 03:08:55     DEPART REASON INCOMPLETE INFORMATION               Depart Action Incomplete Reason   Interactive View/I&O Recently assessed   Patient Understanding Recently assessed               Problems      Active           No contraindication to deep vein thrombosis (DVT) prophylaxis          HTN (hypertension)              Smoking Status      Never smoker         PATIENT EDUCATION INFORMATION  Instructions:     Confusion; Urinary Tract Infection, Adult     Follow up:                   With: Address: When:   Follow up with primary care provider  Within 1 to 2 days   Comments:   If need assistance in establishing a primary care doctor call  [phone]              ED PROVIDER DOCUMENTATION     Patient:   [name], Allison            MRN: [id number]            FIN: [id number]               Age:   44 years     Sex:  Female     DOB:  [birth year]   Associated Diagnoses:   UTI (urinary tract infection), bacterial; Altered mental state   Author:   DANTON,  WADE-MD      Basic Information   Additional information: Chief Complaint from Nursing Triage Note   Chief Complaint  Chief Complaint: Pt pressed medical alarm.  Was having hallicunations. Pt reports frequent urination (08/30/20 23:06:00).      History of Present Illness   The patient presents with Gradual moderate altered mental status no associated fever chills family is concerned about urinary tract infection patient had a medical alert button stating that she thought people were in the yard banging on the windows she had similar episodes when having a urinary tract infection in the past no radiation patient without complaints currently symptoms occurred over the last 1 day.        Review of Systems   Constitutional symptoms:  No fever,    Skin symptoms:  No petechiae,    Respiratory symptoms:  No hemoptysis,    Psychiatric symptoms:  no si  .             Additional review of systems information: Other pertinent  systems reveiwed as negative  except as documented in the HPI.  SABRA      Health Status   Allergies:    Allergic Reactions (Selected)  Severe  Shellfish- Facial swelling.  Moderate  Chocolate- No reactions were documented.  Peaches- No reactions were documented..   Medications:  (Selected)  Prescriptions  Prescribed  lisinopril  20 mg oral tablet: 20 mg, 1 tabs, Oral, Daily, 30 tabs, 0 Refill(s)  Documented Medications  Documented  Lasix : 10 mg, Daily, 0 Refill(s)  Norvasc  10 mg oral tablet: 10 mg, 1 tabs, Oral, Daily, 0 Refill(s)  One-A-Day Women 50 Plus oral tablet: 1 tabs, Oral, Daily, 30 tabs, 0 Refill(s)  aspirin  81 mg oral tablet: 81 mg, 1 tabs, Oral, Daily, 0 Refill(s)  atorvastatin 40 mg oral  tablet: 40 mg, 1 tabs, Oral, Daily, 0 Refill(s).      Past Medical/ Family/ Social History   Problem list:    Active Problems (4)  At risk for falls   HLD (hyperlipidemia)   HTN (hypertension)   No contraindication to deep vein thrombosis (DVT) prophylaxis   .      Physical Examination               Vital Signs   Vital Signs   08/30/2020 23:22 EDT Peripheral Pulse Rate 60 bpm    Heart Rate Monitored 62 bpm    Mean Arterial Pressure, Cuff 129 mmHg  HI    SpO2 97 %   08/30/2020 23:12 EDT Systolic Blood Pressure 179 mmHg  HI    Diastolic Blood Pressure 83 mmHg    Temperature Oral 37.1 degC    Heart Rate Monitored 59 bpm  LOW    SpO2 98 %   .   Measurements   08/30/2020 23:09 EDT Body Mass Index est meas 25.24 kg/m2    Body Mass Index Measured 25.24 kg/m2   08/30/2020 23:06 EDT Height/Length Measured 167 cm    Weight Dosing 70.4 kg   .   Basic Oxygen Information   08/30/2020 23:22 EDT SpO2 97 %   08/30/2020 23:12 EDT SpO2 98 %   .   General:  No acute distress.   Skin:  No pallor.   Eye:  Vision grossly normal.   Ears, nose, mouth and throat:  Oral mucosa moist.   Cardiovascular:  Normal peripheral perfusion.   Respiratory:  no respiratory distress .   Neurological:  Normal speech observed, Glasgow coma scale: 5-5 strength upper lower extremities 2+ radial pulses bilaterally dry mucous membranes are noted patient is otherwise well-appearing oriented to person place situation no facial asymmetry tongue and uvula midline no active hallucinations.    Psychiatric:  Appropriate mood & affect, non-suicidal.       Medical Decision Making   Rationale:  Altered mental status secondary to urinary tract infection we will give a dose of ceftriaxone.  She is not showing signs of overt sepsis or bacteremia hemodynamically stable patient's mental status is significantly improved she has no focal deficits to suggest intracranial pathology as such she was discharged stable condition.   Documents reviewed:  Emergency department nurses' notes,  flowsheet, emergency department records, prior records, vital signs.    Results review:  Lab results : Lab View   08/31/2020 0:29 EDT UA Color Yellow    UA Appear Cloudy    UA Glucose Negative    UA Bili Negative    UA Ketones Trace    UA Spec Grav 1.025    UA Blood Negative    UA pH 6.0    Protein U Trace    UA Urobilinogen 1.0 EU/dL    UA Nitrite Positive    UA Leuk Est Moderate    WBC U 51-100 /HPF    RBC U 3-5 /HPF    Sq Epi U Moderate /LPF  Bacteria U Many   08/31/2020 0:24 EDT Estimated Creatinine Clearance 37.78 mL/min   08/31/2020 0:03 EDT WBC 7.1 x10e3/mcL    RBC 4.38 x10e6/mcL    Hgb 12.3 g/dL    HCT 64.5 %    MCV 19.1 fL  LOW    MCH 28.1 pg    MCHC 34.7 g/dL    RDW 86.7 %    Platelet 147 x10e3/mcL    MPV 10.8 fL    Neutro Auto 52.9 %    Neutro Absolute 3.8 x10e3/mcL    Immature Grans Percent 0.3 %    Immature Grans Absolute 0.02 x10e3/mcL    Lymph Auto 34.9 %    Lymph Absolute 2.5 x10e3/mcL    Mono Auto 9.3 %    Mono Absolute 0.7 x10e3/mcL    Eosinophil Percent 2.0 %    Eos Absolute 0.1 x10e3/mcL    Basophil Auto 0.6 %    Baso Absolute 0.0 x10e3/mcL    NRBC Absolute Auto 0.000 x10e3/mcL    NRBC Percent Auto 0.0 %    Sodium Lvl 142 mmol/L    Potassium Lvl 3.5 mmol/L    Chloride 105 mmol/L    CO2 29 mmol/L    Glucose Random 147 mg/dL  HI    BUN 16 mg/dL    Creatinine Lvl 1.1 mg/dL  HI    AGAP 9 mmol/L    Osmolality Calc 287 mOsm/kg    Calcium Lvl 8.9 mg/dL    Protein Total 6.6 g/dL    Albumin Lvl 3.7 g/dL    Globulin Calc 2.9 g/dL    AG Ratio Calc 8.71 mmol/L    Alk Phos 56 unit/L    AST 29 unit/L    ALT 33 unit/L    eGFR AA 51 mL/min/1.28m  LOW    eGFR Non-AA 44 mL/min/1.69m  LOW    Bili Total 0.20 mg/dL   4/80/7977 76:90 EDT Estimated Creatinine Clearance 59.37 mL/min   .      Reexamination/ Reevaluation   Vital signs   Basic Oxygen Information   08/30/2020 23:22 EDT SpO2 97 %   08/30/2020 23:12 EDT SpO2 98 %         Impression and Plan   Diagnosis   UTI (urinary tract infection), bacterial (ICD10-CM  N39.0, Discharge, Medical)   Altered mental state (ICD10-CM R41.82, Discharge, Medical)   Plan   Condition: Improved.    Disposition: Medically cleared, Discharged: to home.    Prescriptions: Launch prescriptions   Pharmacy:  lactobacillus acidophilus oral capsule (Prescribe): 1 tabs, Oral, Daily, for 30 days, 30 tabs, 0 Refill(s)  Keflex 500 mg oral capsule (Prescribe): 500 mg, 1 caps, Oral, QID, for 7 days, 28 caps, 0 Refill(s).    Patient was given the following educational materials: Urinary Tract Infection, Adult, Confusion.    Follow up with: Follow up with primary care provider Within 1 to 2 days If need assistance in establishing a primary care doctor call [phone].    Notes: The patient was counseled at length by the physician and nursing staff on the disposition  plans well as  any pertinent laboratory and radiology findings  were reviewed with the patient and or family.  They  were informed that there results  may be preliminary and that any  discrepencies  would conveyed  to them via  the contact number they  provided to the registration staff.  SABRA

## 2020-09-02 LAB — CULTURE, URINE: FINAL REPORT: >100000

## 2020-10-09 LAB — BASIC METABOLIC PANEL
Anion Gap: 12 mmol/L (ref 2–17)
BUN: 13 mg/dL (ref 8–23)
CO2: 26 mmol/L (ref 22–29)
Calcium: 8.9 mg/dL (ref 8.8–10.2)
Chloride: 104 mmol/L (ref 98–107)
Creatinine: 0.8 mg/dL (ref 0.5–1.0)
Est, Glom Filt Rate: 70 mL/min/1.73m?? — ABNORMAL LOW (ref >=90)
Glucose: 109 mg/dL — ABNORMAL HIGH (ref 70–99)
OSMOLALITY CALCULATED: 284 mOsm/kg (ref 270–287)
Potassium: 3.6 mmol/L (ref 3.5–5.3)
Sodium: 142 mmol/L (ref 135–145)

## 2020-10-09 LAB — CBC WITH AUTO DIFFERENTIAL
Absolute Baso #: 0.1 10*3/uL (ref 0.0–0.2)
Absolute Eos #: 0.3 10*3/uL (ref 0.0–0.5)
Absolute Lymph #: 3.6 10*3/uL — ABNORMAL HIGH (ref 1.0–3.2)
Absolute Mono #: 0.6 10*3/uL (ref 0.3–1.0)
Basophils %: 1.1 % (ref 0.0–2.0)
Eosinophils %: 4 % (ref 0.0–7.0)
Hematocrit: 37.1 % (ref 34.0–47.0)
Hemoglobin: 13 g/dL (ref 11.5–15.7)
Immature Grans (Abs): 0.02 10*3/uL (ref 0.00–0.06)
Immature Granulocytes: 0.3 % (ref 0.0–0.6)
Lymphocytes: 48.5 % — ABNORMAL HIGH (ref 15.0–45.0)
MCH: 28.4 pg (ref 27.0–34.5)
MCHC: 35 g/dL (ref 32.0–36.0)
MCV: 81.2 fL (ref 81.0–99.0)
MPV: 10.8 fL (ref 7.2–13.2)
Monocytes: 8.2 % (ref 4.0–12.0)
NRBC Absolute: 0 10*3/uL (ref 0.000–0.012)
NRBC Automated: 0 % (ref 0.0–0.2)
Neutrophils %: 37.9 % — ABNORMAL LOW (ref 42.0–74.0)
Neutrophils Absolute: 2.8 10*3/uL (ref 1.6–7.3)
Platelets: 133 10*3/uL — ABNORMAL LOW (ref 140–440)
RBC: 4.57 x10e6/mcL (ref 3.60–5.20)
RDW: 13.5 % (ref 11.0–16.0)
WBC: 7.3 10*3/uL (ref 3.8–10.6)

## 2020-10-09 LAB — CBC
Hematocrit: 38.1 % (ref 34.0–47.0)
Hemoglobin: 13.4 g/dL (ref 11.5–15.7)
MCH: 28.3 pg (ref 27.0–34.5)
MCHC: 35.2 g/dL (ref 32.0–36.0)
MCV: 80.4 fL — ABNORMAL LOW (ref 81.0–99.0)
MPV: 10.3 fL (ref 7.2–13.2)
NRBC Absolute: 0 10*3/uL (ref 0.000–0.012)
NRBC Automated: 0 % (ref 0.0–0.2)
Platelets: 150 10*3/uL (ref 140–440)
RBC: 4.74 x10e6/mcL (ref 3.60–5.20)
RDW: 13.5 % (ref 11.0–16.0)
WBC: 7 10*3/uL (ref 3.8–10.6)

## 2020-10-09 LAB — N TERMINAL PROBNP (AKA NTPROBNP): NT Pro-BNP: 182 pg/mL (ref 0–450)

## 2020-10-09 LAB — IMMATURE CELLS
IMMATURE PLT ABSOLUTE: 4.4 10*3/uL
IMMATURE PLT PERCENT: 3.3 % (ref 1.2–8.6)

## 2020-10-09 LAB — PROTIME-INR
INR: 1.1 — ABNORMAL LOW (ref 1.5–3.5)
Protime: 14.3 seconds (ref 11.6–14.5)

## 2020-10-09 LAB — TROPONIN T
Troponin T: 0.036 ng/mL (ref 0.000–0.010)
Troponin T: 0.465 ng/mL (ref 0.000–0.010)

## 2020-10-09 LAB — PTT: PTT: 26 seconds (ref 23.3–34.5)

## 2020-10-09 NOTE — Nursing Note (Signed)
Vascular Access Nurse Note               ULTRASOUND GUIDED PERIPHERAL IV INSERTION    INSERTED BY: Vicente Masson, MSN, RN     INDICATION: IV ACCESS REQUIRED.  MULTIPLE ATTEMPTS AT PERIPHERAL IV PLACEMENT WERE MADE BY THE NURSING STAFF WITHOUT SUCCESS.       The patient's left arm was prepped aseptically. Local anesthesia was obtained with 0.25 mL of 1% lidocaine. Noted mid forearm vein compressible. The vein was accessed with 20  gauge catheter 1.75 inches in length X?? 1 attempt. Positive blood return and flushed easily. Visualized catheter within vein lumen via ultrasound. 10 mL of normal saline flushed used to place access.    Complications: NONE  Patient tolerated : WELL    Labs collected from new IV site as phlebotomy and RN were not able to get labs collected.       Signature Line     Addendum by Simon Rhein on October 09, 2020 17:24 EDT               +

## 2020-10-09 NOTE — ED Notes (Signed)
ED Triage Note       ED Secondary Triage Entered On:  10/09/2020 7:51 EDT    Performed On:  10/09/2020 7:49 EDT by Yevette Edwards,  Sara-RN               General Information   Barriers to Learning :   None evident   ED Home Meds Section :   Document assessment   [facility] ED Fall Risk Section :   Document assessment   ED Advance Directives Section :   Document assessment   ED Palliative Screen :   Document assessment   DeMatteo,  Sara-RN - 10/09/2020 7:49 EDT   (As Of: 10/09/2020 07:51:33 EDT)   Problems(Active)    At risk for falls (SNOMED CT  :272536644 )  Name of Problem:   At risk for falls ; Onset Date:   06/25/2017 ; Recorder:   SYSTEM,  SYSTEM; Confirmation:   Confirmed ; Classification:   Interdisciplinary ; Code:   [id number] ; Last Updated:   06/25/2017 2:38 EDT ; Life Cycle Date:   06/25/2017 ; Life Cycle Status:   Active ; Vocabulary:   SNOMED CT   ; Comments:        06/25/2017 2:38 - SYSTEM,  SYSTEM  Problem added by Discern Expert      HLD (hyperlipidemia) (SNOMED CT  :63875643 )  Name of Problem:   HLD (hyperlipidemia) ; Recorder:   BELLEW, RN, KRISTY M; Confirmation:   Confirmed ; Classification:   Patient Stated ; Code:   [id number] ; Contributor System:   PowerChart ; Last Updated:   06/24/2017 16:16 EDT ; Life Cycle Date:   06/24/2017 ; Life Cycle Status:   Active ; Vocabulary:   SNOMED CT        HTN (hypertension) (SNOMED CT  :[phone] )  Name of Problem:   HTN (hypertension) ; Recorder:   Daphine Deutscher, RN, Apolinar Junes; Confirmation:   Confirmed ; Classification:   Medical ; Code:   [phone] ; Contributor System:   PowerChart ; Last Updated:   10/28/2015 8:22 EDT ; Life Cycle Date:   10/28/2015 ; Life Cycle Status:   Active ; Vocabulary:   SNOMED CT        No contraindication to deep vein thrombosis (DVT) prophylaxis (SNOMED CT  :009381 )  Name of Problem:   No contraindication to deep vein thrombosis (DVT) prophylaxis ; Recorder:   THOMPSON,  THEODORE TELL-MD;  Confirmation:   Confirmed ; Classification:   Medical ; Code:   [id number] ; Contributor System:   PowerChart ; Last Updated:   12/28/2017 18:29 EDT ; Life Cycle Status:   Active ; Responsible Provider:   [place] [facility]; Vocabulary:   SNOMED CT          Diagnoses(Active)    Chest pain  Date:   10/09/2020 ; Diagnosis Type:   Reason For Visit ; Confirmation:   Complaint of ; Clinical Dx:   Chest pain ; Classification:   Medical ; Clinical Service:   Emergency medicine ; Code:   PNED ; Probability:   0 ; Diagnosis Code:   [id number]             -    Procedure History   (As Of: 10/09/2020 07:51:33 EDT)     Anesthesia Minutes:   0 ; Procedure Name:   Exploratory laparotomy ; Procedure Minutes:   0 ; Comments:     12/28/2017 18:07 EDT - [name], RN, [place] Dec  Suspected fibroids, but did  not find any.            [facility] Fall Risk Assessment Tool   Hx of falling last 3 months ED Fall :   Yes (Single mechanical fall)   Patient confused or disoriented ED Fall :   No   Patient intoxicated or sedated ED Fall :   No   Patient impaired gait ED Fall :   Unable to assess   Use a mobility assistance device ED Fall :   Unable to assess   Patient altered elimination ED Fall :   No   [facility] ED Fall Score :   5    DeMatteo,  Sara-RN - 10/09/2020 7:49 EDT   ED Advance Directive   Advance Directive :   Yes   DeMatteo,  Sara-RN - 10/09/2020 7:49 EDT   Palliative Care   Does the Patient have a Life Limiting Illness :   Unable to determine   DeMatteo,  Sara-RN - 10/09/2020 7:49 EDT   Med Hx   Medication List   (As Of: 10/09/2020 07:51:33 EDT)   Prescription/Discharge Order    lactobacillus acidophilus  :   lactobacillus acidophilus ; Status:   Prescribed ; Ordered As Mnemonic:   lactobacillus acidophilus oral capsule ; Simple Display Line:   1 tabs, Oral, Daily, for 30 days, 30 tabs, 0 Refill(s) ; Ordering Provider:   Henrene Pastor,  Wade-MD; Catalog Code:   lactobacillus acidophilus ; Order Dt/Tm:   08/31/2020 02:04:39  EDT          lisinopril  :   lisinopril ; Status:   Prescribed ; Ordered As Mnemonic:   lisinopril 20 mg oral tablet ; Simple Display Line:   20 mg, 1 tabs, Oral, Daily, 30 tabs, 0 Refill(s) ; Ordering Provider:   Camillia Herter; Catalog Code:   lisinopril ; Order Dt/Tm:   12/29/2017 13:10:59 EDT            Home Meds    furosemide  :   furosemide ; Status:   Documented ; Ordered As Mnemonic:   Lasix ; Simple Display Line:   10 mg, Daily, 0 Refill(s) ; Catalog Code:   furosemide ; Order Dt/Tm:   12/28/2017 12:08:27 EDT          amLODIPine  :   amLODIPine ; Status:   Documented ; Ordered As Mnemonic:   Norvasc 10 mg oral tablet ; Simple Display Line:   10 mg, 1 tabs, Oral, Daily, 0 Refill(s) ; Catalog Code:   amLODIPine ; Order Dt/Tm:   06/25/2017 12:51:45 EDT          aspirin  :   aspirin ; Status:   Documented ; Ordered As Mnemonic:   aspirin 81 mg oral tablet ; Simple Display Line:   81 mg, 1 tabs, Oral, Daily, 0 Refill(s) ; Catalog Code:   aspirin ; Order Dt/Tm:   06/25/2017 12:54:01 EDT          multivitamin with minerals  :   multivitamin with minerals ; Status:   Documented ; Ordered As Mnemonic:   One-A-Day Women 50 Plus oral tablet ; Simple Display Line:   1 tabs, Oral, Daily, 30 tabs, 0 Refill(s) ; Catalog Code:   multivitamin with minerals ; Order Dt/Tm:   06/25/2017 12:54:24 EDT          atorvastatin  :   atorvastatin ; Status:   Documented ; Ordered As Mnemonic:   atorvastatin 40 mg oral tablet ; Simple  Display Line:   40 mg, 1 tabs, Oral, Daily, 0 Refill(s) ; Ordering Provider:   Nadean Corwin; Catalog Code:   atorvastatin ; Order Dt/Tm:   10/28/2015 08:23:11 EDT

## 2020-10-09 NOTE — ED Notes (Signed)
ED Patient Education Note     Patient Education Materials Follows:

## 2020-10-09 NOTE — ED Notes (Signed)
 ED Patient Summary              [name] Emergency Department  [address], [place] [address]  [phone]  Discharge Instructions (Patient)  Name: Allison [name], Allison [name]  DOB:  [birth year]                   MRN: [id number]                   FIN: [id number]  Reason For Visit: Chest pain; CP  Final Diagnosis: HLD (hyperlipidemia); HTN (hypertension); Pulmonary embolism     Visit Date: 10/09/2020 07:41:00  Address: [address] [place] [address]  Phone: [phone]     Emergency Department Providers:         Primary Physician:      MAURO DONNICE BROCKS         [name] would like to thank you for allowing us  to assist you with your [name] needs. The following includes patient education materials and information regarding your injury/illness.     Follow-up Instructions:  You were seen today on an emergency basis. Please contact your primary care doctor for a follow up appointment. If you received a referral to a specialist doctor, it is important you follow-up as instructed.    It is important that you call your follow-up doctor to schedule and confirm the location of your next appointment. Your doctor may practice at multiple locations. The office location of your follow-up appointment may be different to the one written on your discharge instructions.    If you do not have a primary care doctor, please call [phone] for help in finding a Allison [name]. [name] Provider. For help in finding a specialist doctor, please call [phone].    If your condition gets worse before your follow-up with your primary care doctor or specialist, please return to the Emergency Department.      Coronavirus 2019 (COVID-19) Reminders:     Patients age 94 - 40, with parental consent, and patients over age 24 can make an appointment for a COVID-19 vaccine. Patients can contact their Allison [name] doctors' offices to schedule an appointment to receive the COVID-19  vaccine. Patients who do not have a Allison [name] physician can call [phone] to schedule vaccination appointments.      Follow Up Appointments:  Primary Care Provider:     Name: FRANKLIN,  CHERI L-MD     Phone: [phone]          [facility]%>          Medications that have not changed  Other Medications  amLODIPine  (Norvasc  10 mg oral tablet) 1 Tabs Oral (given by mouth) every day.  Last Dose:____________________  aspirin  (aspirin  81 mg oral tablet) 81 Milligram Oral (given by mouth) every day.  Last Dose:____________________  atorvastatin (atorvastatin 40 mg oral tablet) 1 Tabs Oral (given by mouth) every day.  Last Dose:____________________  furosemide  (Lasix ) 10 Milligram every day.  Last Dose:____________________  lactobacillus acidophilus (lactobacillus acidophilus oral capsule) 1 Tabs Oral (given by mouth) every day for 30 Days. Refills: 0.  Last Dose:____________________  lisinopril  (lisinopril  20 mg oral tablet) 1 Tabs Oral (given by mouth) every day. Refills: 0.  Last Dose:____________________  multivitamin with minerals (One-A-Day Women 50 Plus oral tablet) 1 Tabs Oral (given by mouth) every day.  Last Dose:____________________      Allergy Info: Peaches; Chocolate;  shellfish     Discharge Additional Information    Patient Education Materials:       ---------------------------------------------------------------------------------------------------------------------  [name] allows patients to review your COVID and other test results as well as discharge documents from any Allison [name], Emergency Department, surgical [name] or outpatient lab. Test results are typically available 36 hours after the test is completed.     Allison [name] encourages you to self-enroll in the [name] Patient Portal.     To begin your self-enrollment process, please visit [email or web address] Under [name], click  on "Sign up now".     NOTE: You must be 16 years and older to use Allison [name] Self-Enroll online. If you are a parent, caregiver, or guardian; you need an invite to access your child's or dependent's health records. To obtain an invite, contact the Medical Records department at [phone] Monday through Friday, 8-4:30, select option 3 . If we receive your call afterhours, we will return your call the next business day.     If you have issues trying to create or access your account, contact [employer] support at [phone] available 7 days a week 24 hours a day.     Comment:

## 2020-10-09 NOTE — ED Notes (Signed)
 ED Triage Note       ED Triage Adult Entered On:  10/09/2020 7:47 EDT    Performed On:  10/09/2020 7:43 EDT by DeMatteo,  Sara-RN               Triage   Numeric Rating Pain Scale :   9   Chief Complaint :   ptto ed after GLF while getting out of bed, denies hitting head, no blood thinners, pt c/o pain all over. ems sts pt c/o chest pain gave 324 ASA en route, FSBG 123 pt sts ache in heart, non radiating   [place] Mode of Arrival :   Ambulance   Infectious Disease Documentation :   Document assessment   Patient presentation :   None of the above   Chief Complaint or Presentation suggest infection :   No   Weight Dosing :   60 kg(Converted to: 132 lb 4 oz)    Height :   165 cm(Converted to: 5 ft 5 in)    Body Mass Index Dosing :   22 kg/m2   DeMatteo,  Sara-RN - 10/09/2020 7:43 EDT   DCP GENERIC CODE   Tracking Acuity :   3   Tracking Group :   ED [employer]   DeMatteo,  Sara-RN - 10/09/2020 7:43 EDT   ED General Section :   Document assessment   Pregnancy Status :   N/A   ED Allergies Section :   Document assessment   ED Reason for Visit Section :   Document assessment   ED Quick Assessment :   Patient appears awake, alert, oriented to baseline. Skin warm and dry. Moves all extremities. Respiration even and unlabored. Appears in no apparent distress.   DeMatteo,  Sara-RN - 10/09/2020 7:43 EDT   PTA/Triage Treatments   ED PTA Pre-Arrival Service :   [facility]   [place],  Sara-RN - 10/09/2020 7:43 EDT   ID Risk Screen Symptoms   TB Symptom Screen :   No symptoms   Last 90 days COVID-19 ID :   No   Close Contact with COVID-19 ID :   No   Last 14 days COVID-19 ID :   No   C. diff Symptom/History ID :   Neither of the above   Patient Pregnant :   None of the above   DeMatteo,  Sara-RN - 10/09/2020 7:43 EDT   Allergies   (As Of: 10/09/2020 07:47:39 EDT)   Allergies (Active)   Chocolate  Estimated Onset Date:   Unspecified ; Created By:   CHARLENE RN, KRISTINA A; Reaction Status:   Active ; Category:    Drug ; Substance:   Chocolate ; Type:   Allergy ; Severity:   Moderate ; Updated By:   CHARLENE RN, EVALENE LABOR; Reviewed Date:   10/09/2020 7:46 EDT      Peaches  Estimated Onset Date:   Unspecified ; Created By:   CHARLENE RN, KRISTINA A; Reaction Status:   Active ; Category:   Drug ; Substance:   Peaches ; Type:   Allergy ; Severity:   Moderate ; Updated By:   CHARLENE RN, EVALENE LABOR; Reviewed Date:   10/09/2020 7:46 EDT      shellfish  Estimated Onset Date:   Unspecified ; Reactions:   facial swelling ; Created By:   Arvella, RN, Ciara; Reaction Status:   Active ; Category:   Drug ; Substance:   shellfish ; Type:  Allergy ; Severity:   Severe ; Updated By:   Arvella, RN, Ciara; Reviewed Date:   10/09/2020 7:46 EDT        Psycho-Social   Last 3 mo, thoughts killing self/others :   Patient denies   Right click within box for Suspected Abuse policy link. :   None   Feels Safe Where Live :   Yes   ED Behavioral Activity Rating Scale :   4 - Quiet and awake (normal level of activity)   DeMatteo,  Sara-RN - 10/09/2020 7:43 EDT   ED Reason for Visit   (As Of: 10/09/2020 07:47:39 EDT)   Problems(Active)    At risk for falls (SNOMED CT  :[id number] )  Name of Problem:   At risk for falls ; Onset Date:   06/25/2017 ; Recorder:   SYSTEM,  SYSTEM; Confirmation:   Confirmed ; Classification:   Interdisciplinary ; Code:   [id number] ; Last Updated:   06/25/2017 2:38 EDT ; Life Cycle Date:   06/25/2017 ; Life Cycle Status:   Active ; Vocabulary:   SNOMED CT   ; Comments:        06/25/2017 2:38 - SYSTEM,  SYSTEM  Problem added by Discern Expert      HLD (hyperlipidemia) (SNOMED CT  :07173982 )  Name of Problem:   HLD (hyperlipidemia) ; Recorder:   BELLEW, RN, KRISTY M; Confirmation:   Confirmed ; Classification:   Patient Stated ; Code:   [id number] ; Contributor System:   PowerChart ; Last Updated:   06/24/2017 16:16 EDT ; Life Cycle Date:   06/24/2017 ; Life Cycle Status:   Active ; Vocabulary:   SNOMED CT        HTN (hypertension) (SNOMED  CT  [phone] )  Name of Problem:   HTN (hypertension) ; Recorder:   GLADIS, RN, PENNE; Confirmation:   Confirmed ; Classification:   Medical ; Code:   [phone] ; Contributor System:   PowerChart ; Last Updated:   10/28/2015 8:22 EDT ; Life Cycle Date:   10/28/2015 ; Life Cycle Status:   Active ; Vocabulary:   SNOMED CT        No contraindication to deep vein thrombosis (DVT) prophylaxis (SNOMED CT  :386980 )  Name of Problem:   No contraindication to deep vein thrombosis (DVT) prophylaxis ; Recorder:   THOMPSON,  THEODORE TELL-MD; Confirmation:   Confirmed ; Classification:   Medical ; Code:   386980 ; Contributor System:   PowerChart ; Last Updated:   12/28/2017 18:29 EDT ; Life Cycle Status:   Active ; Responsible Provider:   SEBASTIAN RICARDO BLACKBIRD; Vocabulary:   SNOMED CT          Diagnoses(Active)    Chest pain  Date:   10/09/2020 ; Diagnosis Type:   Reason For Visit ; Confirmation:   Complaint of ; Clinical Dx:   Chest pain ; Classification:   Medical ; Clinical Service:   Emergency medicine ; Code:   PNED ; Probability:   0 ; Diagnosis Code:   [id number]

## 2020-10-09 NOTE — Discharge Summary (Signed)
 ED Clinical Summary                        [facility]  [address]  [place], [place], [address]  [id number]           PERSON INFORMATION  Name: Allison [name], Allison [name] Age:  87 Years DOB: [birth year]   Sex: Female Language: English PCP: JOHNIE ENSIGN L-MD   Marital Status: Widowed Phone: [phone] Med Service: MED-Medicine   MRN: [id number] Acct# [id number] Arrival: 10/09/2020 07:41:00   Visit Reason: Chest pain; CP Acuity: 3 LOS: 000 05:51   Address:    [address] [place] [address]   Diagnosis:    HLD (hyperlipidemia); HTN (hypertension); Pulmonary embolism  Medications:          Medications that have not changed  Other Medications  amLODIPine  (Norvasc  10 mg oral tablet) 1 Tabs Oral (given by mouth) every day.  Last Dose:____________________  aspirin  (aspirin  81 mg oral tablet) 81 Milligram Oral (given by mouth) every day.  Last Dose:____________________  atorvastatin (atorvastatin 40 mg oral tablet) 1 Tabs Oral (given by mouth) every day.  Last Dose:____________________  furosemide  (Lasix ) 10 Milligram every day.  Last Dose:____________________  lactobacillus acidophilus (lactobacillus acidophilus oral capsule) 1 Tabs Oral (given by mouth) every day for 30 Days. Refills: 0.  Last Dose:____________________  lisinopril  (lisinopril  20 mg oral tablet) 1 Tabs Oral (given by mouth) every day. Refills: 0.  Last Dose:____________________  multivitamin with minerals (One-A-Day Women 50 Plus oral tablet) 1 Tabs Oral (given by mouth) every day.  Last Dose:____________________      Medications Administered During Visit:                Medication Dose Route   lidocaine  topical 15 mL Oral   Al hydroxide/Mg hydroxide/simethicone 30 mL Oral   famotidine  20 mg IV Push   iopamidol  100 mL IV Contrast               Allergies      Peaches      Chocolate      shellfish (facial swelling)      Major Tests and Procedures:  The following procedures and tests were performed during your ED visit.  COMMON  PROCEDURES%>  COMMON PROCEDURES COMMENTS%>                PROVIDER INFORMATION               Provider Role Assigned Sampson QUARRY, OKLAHOMA C-MD ED Provider 10/09/2020 07:44:30    DeMatteo, Sara-RN ED Nurse 10/09/2020 07:47:54    Andree Knack ED Nurse 10/09/2020 08:17:40 10/09/2020 08:17:42       Attending Physician:  BOYCE SHUCK W-MD      Admit Doc  BOYCE SHUCK W-MD     Consulting Doc  MILISSA SHARPER L-MD; BOYCE SHUCK W-MD     VITALS INFORMATION  Vital Sign Triage Latest   Temp Oral ORAL_1%> ORAL%>   Temp Temporal TEMPORAL_1%> TEMPORAL%>   Temp Intravascular INTRAVASCULAR_1%> INTRAVASCULAR%>   Temp Axillary AXILLARY_1%> AXILLARY%>   Temp Rectal RECTAL_1%> RECTAL%>   02 Sat 96 % 98 %   Respiratory Rate RATE_1%> RATE%>   Peripheral Pulse Rate PULSE RATE_1%> PULSE RATE%>   Apical Heart Rate HEART RATE_1%> HEART RATE%>   Blood Pressure BLOOD PRESSURE_1%>/ BLOOD PRESSURE_1%>95 mmHg BLOOD PRESSURE%> / BLOOD PRESSURE%>106 mmHg  Immunizations      No Immunizations Documented This Visit          DISCHARGE INFORMATION   Discharge Disposition: S Outpt-Admitted As Inpatient   Discharge Location:  [facility]   Discharge Date and Time:     ED Checkout Date and Time:  10/09/2020 13:32:30     DEPART REASON INCOMPLETE INFORMATION             Problems      Active           No contraindication to deep vein thrombosis (DVT) prophylaxis          HTN (hypertension)              Smoking Status      Never smoker         PATIENT EDUCATION INFORMATION  Instructions:          Follow up:            ED PROVIDER DOCUMENTATION     Patient:   Allison [name], Allison [name]            MRN: [id number]            FIN: [id number]               Age:   87 years     Sex:  Female     DOB:  19-Dec-1928   Associated Diagnoses:   Pulmonary embolism   Author:   MAURO COUGH C-MD      Basic Information   Additional information: Chief Complaint from Nursing Triage Note   Chief Complaint  Chief Complaint: ptto ed after GLF while getting out of  bed, denies hitting head, no blood thinners, pt c/o pain all over. ems sts pt c/o chest pain gave 324 ASA en route, FSBG 123 pt sts ache in heart, non radiating (10/09/20 07:43:00).      History of Present Illness   87 year old female with history of dementia presents with ground-level fall from facility.  It was unwitnessed, she denies hitting her head, at this time she is complaining of an aching sensation in her chest that is made worse with palpation, denies any shortness of breath, neck or back pain, trouble moving her extremities.  She is able to follow commands in all 4 extremities, she answers most questioning, she is oriented to person and situation.      Review of Systems             Additional review of systems information: All other systems reviewed and otherwise negative.      Health Status   Allergies:    Allergic Reactions (Selected)  Severe  Shellfish- Facial swelling.  Moderate  Chocolate- No reactions were documented.  Peaches- No reactions were documented..   Medications:  (Selected)   Prescriptions  Prescribed  lactobacillus acidophilus oral capsule: 1 tabs, Oral, Daily, for 30 days, 30 tabs, 0 Refill(s)  lisinopril  20 mg oral tablet: 20 mg, 1 tabs, Oral, Daily, 30 tabs, 0 Refill(s)  Documented Medications  Documented  Lasix : 10 mg, Daily, 0 Refill(s)  Norvasc  10 mg oral tablet: 10 mg, 1 tabs, Oral, Daily, 0 Refill(s)  One-A-Day Women 50 Plus oral tablet: 1 tabs, Oral, Daily, 30 tabs, 0 Refill(s)  aspirin  81 mg oral tablet: 81 mg, 1 tabs, Oral, Daily, 0 Refill(s)  atorvastatin 40 mg oral tablet: 40 mg, 1 tabs, Oral, Daily, 0 Refill(s).      Past Medical/ Family/ Social History  Medical history: Reviewed as documented in chart.   Surgical history:    Exploratory laparotomy ([id number]).  Comments:  12/28/2017 18:07 EDT - Sebastian, RN, Camie  Suspected fibroids, but did not find any., Reviewed as documented in chart.   Family history:    No family history items have been selected or recorded.,  Reviewed as documented in chart.   Social history:    Social & Psychosocial Habits    Tobacco  10/28/2015  Use: Never smoker  , Reviewed as documented in chart.   Problem list:    Active Problems (4)  At risk for falls   HLD (hyperlipidemia)   HTN (hypertension)   No contraindication to deep vein thrombosis (DVT) prophylaxis   , per nurse's notes.      Physical Examination               Vital Signs   Vital Signs   10/09/2020 7:54 EDT Systolic Blood Pressure 158 mmHg  HI    Diastolic Blood Pressure 95 mmHg  HI    Heart Rate Monitored 93 bpm    Respiratory Rate 16 br/min    Mean Arterial Pressure, Cuff 120 mmHg  HI   .   General:  Alert, no acute distress.    Skin:  Warm, dry.    Head:  Normocephalic, atraumatic.    Neck:  Supple, trachea midline.    Eye:  Pupils are equal, round and reactive to light, extraocular movements are intact.    Cardiovascular:  Regular rate and rhythm, Normal peripheral perfusion, No edema.    Respiratory:  Lungs are clear to auscultation, respirations are non-labored.    Chest wall:  Patient has diffuse tenderness across the anterior chest wall, there is no palpable crepitus, no bruising noted..   Musculoskeletal:  Normal ROM, normal strength.    Gastrointestinal:  Non distended.   Neurological:  No focal neurological deficit observed, CN II-XII intact, normal sensory observed, normal speech observed, normal coordination observed, Patient is alert, oriented to person and place, she follows commands in all 4 extremities, her lower extremities are bilaterally weak but she is able to lift both off the bed unassisted.SABRA    Psychiatric:  Cooperative, appropriate mood & affect.       Medical Decision Making   Differential Diagnosis:  Fall.   Documents reviewed:  Emergency department nurses' notes.   Electrocardiogram:  Rate 94, normal sinus rhythm, no ectopy, Emergency Provider interpretation performed by me, Right bundle branch block, this is new compared to prior EKG..    Patient presents with a  fall and some chest pain, from a traumatic standpoint she has no obvious injury, CT of the head and neck negative, no traumatic injury to the chest.  Patient had a new right bundle branch block compared to prior EKG this year, given her comorbid conditions and a slightly elevated troponin a CTA of the chest was ordered which demonstrated multiple acute PEs.  Second troponin has gone up to 0.4.  I had long discussion with the power of attorney whose name is Francis Blush, she is patient's granddaughter, her cell phone number is [phone].  Patient is a DNR, she has expressed many times that she would like to just be at home.  The granddaughter does not want anything aggressive to be done, I discussed the treatment options for PE including blood thinners, she is willing to have patient stay in the hospital for monitoring and treatment of this, I spoke with her about the  coronary calcifications, also spoke with Dr. Kassie who would not offer patient catheterization.  Will speak with hospitalist regarding admission and treatment of PEs. Heparin started, patient admitted for further management.       Impression and Plan   Diagnosis   Pulmonary embolism (ICD10-CM I26.99, Discharge, Medical)

## 2020-10-09 NOTE — Code Documentation (Signed)
Resuscitation Assessment Doc- Text       Resuscitation Assessment Documentation Entered On:  10/09/2020 14:02 EDT    Performed On:  10/09/2020 14:02 EDT by Caren Macadam W-MD               Resuscitation Assessment Documentation   Patient Capable of Making Decision :   Yes   Caren Macadam W-MD - 10/09/2020 14:02 EDT

## 2020-10-09 NOTE — ED Provider Notes (Signed)
Fall *ED        Patient:   Allison [name], Allison [name]            MRN: [id number]            FIN: [id number]               Age:   87 years     Sex:  Female     DOB:  05-May-1928   Associated Diagnoses:   Pulmonary embolism   Author:   Volney Presser C-MD      Basic Information   Additional information: Chief Complaint from Nursing Triage Note   Chief Complaint  Chief Complaint: ptto ed after GLF while getting out of bed, denies hitting head, no blood thinners, pt c/o pain all over. ems sts "pt c/o chest pain gave 324 ASA en route, FSBG 123" pt sts "ache in heart, non radiating" (10/09/20 07:43:00).      History of Present Illness   87 year old female with history of dementia presents with ground-level fall from facility.  It was unwitnessed, she denies hitting her head, at this time she is complaining of an aching sensation in her chest that is made worse with palpation, denies any shortness of breath, neck or back pain, trouble moving her extremities.  She is able to follow commands in all 4 extremities, she answers most questioning, she is oriented to person and situation.      Review of Systems             Additional review of systems information: All other systems reviewed and otherwise negative.      Health Status   Allergies:    Allergic Reactions (Selected)  Severe  Shellfish- Facial swelling.  Moderate  Chocolate- No reactions were documented.  Peaches- No reactions were documented..   Medications:  (Selected)   Prescriptions  Prescribed  lactobacillus acidophilus oral capsule: 1 tabs, Oral, Daily, for 30 days, 30 tabs, 0 Refill(s)  lisinopril 20 mg oral tablet: 20 mg, 1 tabs, Oral, Daily, 30 tabs, 0 Refill(s)  Documented Medications  Documented  Lasix: 10 mg, Daily, 0 Refill(s)  Norvasc 10 mg oral tablet: 10 mg, 1 tabs, Oral, Daily, 0 Refill(s)  One-A-Day Women 50 Plus oral tablet: 1 tabs, Oral, Daily, 30 tabs, 0 Refill(s)  aspirin 81 mg oral tablet: 81 mg, 1 tabs, Oral, Daily, 0 Refill(s)  atorvastatin 40 mg oral  tablet: 40 mg, 1 tabs, Oral, Daily, 0 Refill(s).      Past Medical/ Family/ Social History   Medical history: Reviewed as documented in chart.   Surgical history:    Exploratory laparotomy ([id number]).  Comments:  12/28/2017 18:07 EDT - [name], RN, [place]  Suspected fibroids, but did not find any., Reviewed as documented in chart.   Family history:    No family history items have been selected or recorded., Reviewed as documented in chart.   Social history:    Social & Psychosocial Habits    Tobacco  10/28/2015  Use: Never smoker  , Reviewed as documented in chart.   Problem list:    Active Problems (4)  At risk for falls   HLD (hyperlipidemia)   HTN (hypertension)   No contraindication to deep vein thrombosis (DVT) prophylaxis   , per nurse's notes.      Physical Examination               Vital Signs   Vital Signs   10/09/2020 7:54 EDT Systolic Blood Pressure 158 mmHg  HI    Diastolic Blood Pressure 95 mmHg  HI    Heart Rate Monitored 93 bpm    Respiratory Rate 16 br/min    Mean Arterial Pressure, Cuff 120 mmHg  HI   .   General:  Alert, no acute distress.    Skin:  Warm, dry.    Head:  Normocephalic, atraumatic.    Neck:  Supple, trachea midline.    Eye:  Pupils are equal, round and reactive to light, extraocular movements are intact.    Cardiovascular:  Regular rate and rhythm, Normal peripheral perfusion, No edema.    Respiratory:  Lungs are clear to auscultation, respirations are non-labored.    Chest wall:  Patient has diffuse tenderness across the anterior chest wall, there is no palpable crepitus, no bruising noted..   Musculoskeletal:  Normal ROM, normal strength.    Gastrointestinal:  Non distended.   Neurological:  No focal neurological deficit observed, CN II-XII intact, normal sensory observed, normal speech observed, normal coordination observed, Patient is alert, oriented to person and place, she follows commands in all 4 extremities, her lower extremities are bilaterally weak but she is able to lift  both off the bed unassisted.Marland Kitchen.    Psychiatric:  Cooperative, appropriate mood & affect.       Medical Decision Making   Differential Diagnosis:  Fall.   Documents reviewed:  Emergency department nurses' notes.   Electrocardiogram:  Rate 94, normal sinus rhythm, no ectopy, Emergency Provider interpretation performed by me, Right bundle branch block, this is new compared to prior EKG..    Patient presents with a fall and some chest pain, from a traumatic standpoint she has no obvious injury, CT of the head and neck negative, no traumatic injury to the chest.  Patient had a new right bundle branch block compared to prior EKG this year, given her comorbid conditions and a slightly elevated troponin a CTA of the chest was ordered which demonstrated multiple acute PEs.  Second troponin has gone up to 0.4.  I had long discussion with the power of attorney whose name is Deedra Ehrichabatha Wilson, she is patient's granddaughter, her cell phone number is [phone].  Patient is a DNR, she has expressed many times that she would like to just be at home.  The granddaughter does not want anything aggressive to be done, I discussed the treatment options for PE including blood thinners, she is willing to have patient stay in the hospital for monitoring and treatment of this, I spoke with her about the coronary calcifications, also spoke with Dr. Everardo AllEllison who would not offer patient catheterization.  Will speak with hospitalist regarding admission and treatment of PEs. Heparin started, patient admitted for further management.       Impression and Plan   Diagnosis   Pulmonary embolism (ICD10-CM I26.99, Discharge, Medical)   Signature Line     Electronically Signed on 10/09/2020 11:55 AM EDT   ________________________________________________   Volney PresserHOSKINS,  Tylea Hise C-MD               Modified by: Volney PresserHOSKINS,  Ichelle Harral C-MD on 10/09/2020 08:01 AM EDT      Modified by: Isabell JarvisHOSKINS,  Junella Domke C-MD on 10/09/2020 08:02 AM EDT      Modified by: Isabell JarvisHOSKINS,  Odetta Forness  C-MD on 10/09/2020 10:45 AM EDT      Modified by: Isabell JarvisHOSKINS,  Tage Feggins C-MD on 10/09/2020 10:55 AM EDT      Modified by: Isabell JarvisHOSKINS,  Whitnie Deleon C-MD on 10/09/2020 10:58 AM EDT  Modified byVolney Presser C-MD on 10/09/2020 11:55 AM EDT

## 2020-10-09 NOTE — ED Notes (Signed)
ED Pre-Arrival Note        Pre-Arrival Summary    Name:  [facility],    Current Date:  10/09/2020 07:43:20 EDT  Gender:  Female  Date of Birth:    Age:  87  Pre-Arrival Type:  EMS  ETA:  10/09/2020 07:48:00 EDT  Primary Care Physician:    Presenting Problem:  fall, CP  Pre-Arrival User:    Referring Source:    Location:  [facility]  BP:  189/99  HR:  87  O2:  96            PreArrival Communication Form  Emergency Department        Additional Patient Information:        Orders:  [    ] CBC                                            [     ] CT Head no contrast  [    ] BMP                                           [     ] CT Abdomen/Pelvis no contrast  [    ] PT/INR                                       [     ] CT Abdomen/Pelvis IV contrast, w/ oral contrast  [    ] Troponin                                   [     ] CT Abdomen/Pelvis IV contrast, no oral contrast  [    ] BNP                                            [     ] See ordersheet  [    ] CXR                                             [     ] Other:__________________________  [    ] EKG

## 2020-10-09 NOTE — Nursing Note (Signed)
Adult Patient History Form-Text       [name]:  10/09/2020 14:17 EDT    Performed On:  10/09/2020 14:01 EDT by Domingo Sep               General Info   Patient Identified :   Identification band   Patient Identified :   Allison [name]   Information Given By :   OtherWyatt Mage   Preferred Mode of Communication :   Verbal   Accompanied By :   Clearence Cheek   In Charge of News (ICON) Name :   [name] [phone]   Pregnancy Status :   N/A   Has the patient received chemotherapy or immunotherapy (cytotoxic)  in the last 48-72 hours? :   No   Is the patient currently (2-3 days) receiving radiation treatment? :   No   Lamar Benes,  Claudia-RN - 10/09/2020 14:01 EDT   Allergies   (As Of: 10/09/2020 14:17:45 EDT)   Allergies (Active)   Chocolate  Estimated Onset Date:   Unspecified ; Created By:   Judi Saa RN, KRISTINA A; Reaction Status:   Active ; Category:   Drug ; Substance:   Chocolate ; Type:   Allergy ; Severity:   Moderate ; Updated By:   Judi Saa RN, Anabel Bene; Reviewed Date:   10/09/2020 14:02 EDT      Peaches  Estimated Onset Date:   Unspecified ; Created By:   Judi Saa RN, KRISTINA A; Reaction Status:   Active ; Category:   Drug ; Substance:   Peaches ; Type:   Allergy ; Severity:   Moderate ; Updated By:   Judi Saa RN, Anabel Bene; Reviewed Date:   10/09/2020 14:02 EDT      shellfish  Estimated Onset Date:   Unspecified ; Reactions:   facial swelling ; Created By:   Jennet Maduro, RN, Ciara; Reaction Status:   Active ; Category:   Drug ; Substance:   shellfish ; Type:   Allergy ; Severity:   Severe ; Updated By:   Jennet Maduro, RN, Ciara; Reviewed Date:   10/09/2020 14:02 EDT      Strawberries  Estimated Onset Date:   Unspecified ; Reactions:   Unknown ; Created By:   Domingo Sep; Reaction Status:   Active ; Category:   Drug ; Substance:   Strawberries ; Type:   Allergy ; Severity:   Moderate ; Updated By:   Domingo Sep; Reviewed Date:   10/09/2020 14:03 EDT        Medication History   Medication List    (As Of: 10/09/2020 14:17:45 EDT)   Normal Order    heparin 25,000 units [18 unit/kg/hr] + Premix D5W 250 mL  :   heparin 25,000 units [18 unit/kg/hr] + Premix D5W 250 mL ; Status:   Ordered ; Ordered As Mnemonic:   heparin additive 25,000 units [18 unit/kg/hr] + Premix D5W 250 mL ; Simple Display Line:   10.8 mL/hr, IV ; Ordering Provider:   Caren Macadam W-MD; Catalog Code:   Premix D5W ; Order Dt/Tm:   10/09/2020 14:02:25 EDT ; Comment:   Table for Heparin Adjustment based on PTT:    PTT           Bolus Dose            Adjustments to Infusion  < 53           4,000 units  Increase dose by 4 units/kg/hr  53-67         3,000 units            Increase dose by 2 units/kg/hr  68-106       No Bolus              Target PTT - no change  107-123     No Bolus              Decrease dose by 1 unit/kg/hr  124-137     No Bolus              Hold x 30 minutes, decrease dose by 2 units/kg/hr  > 137         No Bolus              Hold x 1 hour, decrease dose by 3 units/kg/hr    PTT-heparin 4 hrs after infusion start, dose adjustment for the first 24 hrs then    PTT-heparin 6 hrs after infusion start, dose adjustment or single PTT in target range (QAM after 2 consecutive = 68-106)          heparin 5000 units/mL Inj Soln 1 mL  :   heparin 5000 units/mL Inj Soln 1 mL ; Status:   Ordered ; Ordered As Mnemonic:   heparin (max. cap 10,000 units) ; Simple Display Line:   5,000 units, 1 mL, IV Push, Once ; Ordering Provider:   Caren Macadam W-MD; Catalog Code:   heparin ; Order Dt/Tm:   10/09/2020 14:02:25 EDT ; Comment:   Target Dose: heparin (max. cap 10,000 units) 80 unit/kg  10/09/2020 14:02:26          acetaminophen 325 mg Tab  :   acetaminophen 325 mg Tab ; Status:   Ordered ; Ordered As Mnemonic:   acetaminophen ; Simple Display Line:   650 mg, 2 tabs, Oral, q4hr, PRN: mild pain (1-3) ; Ordering Provider:   Caren Macadam W-MD; Catalog Code:   acetaminophen ; Order Dt/Tm:   10/09/2020 14:01:53 EDT          acetaminophen-HYDROcodone  325 mg-5 mg Tab  :   acetaminophen-HYDROcodone 325 mg-5 mg Tab ; Status:   Ordered ; Ordered As Mnemonic:   HYDROcodone-acetaminophen 5 mg-325 mg oral tablet ; Simple Display Line:   1 tabs, Oral, q4hr, PRN: moderate pain (4-7) ; Ordering Provider:   Caren Macadam W-MD; Catalog Code:   HYDROcodone-acetaminophen ; Order Dt/Tm:   10/09/2020 14:01:56 EDT          morphine 4 mg/mL preservative-free Sol 1 mL  :   morphine 4 mg/mL preservative-free Sol 1 mL ; Status:   Ordered ; Ordered As Mnemonic:   morphine range dose ; Simple Display Line:   5 mg, 1.25 mL, IV Push, q4hr, PRN: severe pain (8-10) ; Ordering Provider:   Caren Macadam W-MD; Catalog Code:   morphine ; Order Dt/Tm:   10/09/2020 14:01:57 EDT ; Comment:   dilute with sterile water or normal saline for a final concentration 1 to 5 mg/ml** Range dose 1-5 mg**          ondansetron 2 mg/mL Inj Soln 2 mL  :   ondansetron 2 mg/mL Inj Soln 2 mL ; Status:   Ordered ; Ordered As Mnemonic:   ondansetron ; Simple Display Line:   4 mg, 2 mL, IV Push, q6hr, PRN: nausea/vomiting ; Ordering Provider:   Caren Macadam W-MD;  Catalog Code:   ondansetron ; Order Dt/Tm:   10/09/2020 14:01:57 EDT          iopamidol 76% Inj Soln 100 mL  :   iopamidol 76% Inj Soln 100 mL ; Status:   Completed ; Ordered As Mnemonic:   Isovue-370 ; Simple Display Line:   100 mL, IV Contrast, Once ; Ordering Provider:   Isabell Jarvis,  MATTHEW C-MD; Catalog Code:   iopamidol ; Order Dt/Tm:   10/09/2020 09:08:26 EDT          aluminum hydroxide/magnesium hydroxide/simethicone 200 mg-200 mg-20 mg/5 mL  :   aluminum hydroxide/magnesium hydroxide/simethicone 200 mg-200 mg-20 mg/5 mL ; Status:   Completed ; Ordered As Mnemonic:   aluminum hydroxide/magnesium hydroxide/simethicone 200 mg-200 mg-20 mg/5 mL oral suspension ; Simple Display Line:   30 mL, Oral, Once ; Ordering Provider:   Volney Presser C-MD; Catalog Code:   Al hydroxide/Mg hydroxide/simethicone ; Order Dt/Tm:   10/09/2020 08:50:43 EDT          famotidine  10 mg/mL IV Soln 2 mL  :   famotidine 10 mg/mL IV Soln 2 mL ; Status:   Completed ; Ordered As Mnemonic:   Pepcid ; Simple Display Line:   20 mg, 2 mL, IV Push, Once ; Ordering Provider:   Volney Presser C-MD; Catalog Code:   famotidine ; Order Dt/Tm:   10/09/2020 08:50:39 EDT          lidocaine 2% Mucous Membrane Soln 15 mL  :   lidocaine 2% Mucous Membrane Soln 15 mL ; Status:   Completed ; Ordered As Mnemonic:   lidocaine 2% mucous membrane solution ; Simple Display Line:   15 mL, Oral, Once ; Ordering Provider:   Volney Presser C-MD; Catalog Code:   lidocaine topical ; Order Dt/Tm:   10/09/2020 08:50:43 EDT            Prescription/Discharge Order    lactobacillus acidophilus  :   lactobacillus acidophilus ; Status:   Prescribed ; Ordered As Mnemonic:   lactobacillus acidophilus oral capsule ; Simple Display Line:   1 tabs, Oral, Daily, for 30 days, 30 tabs, 0 Refill(s) ; Ordering Provider:   Henrene Pastor,  Wade-MD; Catalog Code:   lactobacillus acidophilus ; Order Dt/Tm:   08/31/2020 02:04:39 EDT          lisinopril  :   lisinopril ; Status:   Prescribed ; Ordered As Mnemonic:   lisinopril 20 mg oral tablet ; Simple Display Line:   20 mg, 1 tabs, Oral, Daily, 30 tabs, 0 Refill(s) ; Ordering Provider:   Camillia Herter; Catalog Code:   lisinopril ; Order Dt/Tm:   12/29/2017 13:10:59 EDT            Home Meds    furosemide  :   furosemide ; Status:   Documented ; Ordered As Mnemonic:   Lasix ; Simple Display Line:   10 mg, Daily, 0 Refill(s) ; Catalog Code:   furosemide ; Order Dt/Tm:   12/28/2017 12:08:27 EDT          amLODIPine  :   amLODIPine ; Status:   Documented ; Ordered As Mnemonic:   Norvasc 10 mg oral tablet ; Simple Display Line:   10 mg, 1 tabs, Oral, Daily, 0 Refill(s) ; Catalog Code:   amLODIPine ; Order Dt/Tm:   06/25/2017 12:51:45 EDT          aspirin  :   aspirin ; Status:  Documented ; Ordered As Mnemonic:   aspirin 81 mg oral tablet ; Simple Display Line:   81 mg, 1 tabs, Oral, Daily, 0  Refill(s) ; Catalog Code:   aspirin ; Order Dt/Tm:   06/25/2017 12:54:01 EDT          multivitamin with minerals  :   multivitamin with minerals ; Status:   Documented ; Ordered As Mnemonic:   One-A-Day Women 50 Plus oral tablet ; Simple Display Line:   1 tabs, Oral, Daily, 30 tabs, 0 Refill(s) ; Catalog Code:   multivitamin with minerals ; Order Dt/Tm:   06/25/2017 12:54:24 EDT          atorvastatin  :   atorvastatin ; Status:   Processing ; Ordered As Mnemonic:   atorvastatin 40 mg oral tablet ; Simple Display Line:   40 mg, 1 tabs, Oral, Daily, patient states MD d/c medication, 0 Refill(s) ; Ordering Provider:   Silvana Newness L-MD; Action Display:   Modify ; Catalog Code:   atorvastatin ; Order Dt/Tm:   10/09/2020 14:07:12 EDT            Problem History   (As Of: 10/09/2020 14:17:45 EDT)   Problems(Active)    At risk for falls (SNOMED CT  :[id number] )  Name of Problem:   At risk for falls ; Onset Date:   06/25/2017 ; Recorder:   SYSTEM,  SYSTEM; Confirmation:   Confirmed ; Classification:   Interdisciplinary ; Code:   [id number] ; Last Updated:   06/25/2017 2:38 EDT ; Life Cycle Date:   06/25/2017 ; Life Cycle Status:   Active ; Vocabulary:   SNOMED CT   ; Comments:        06/25/2017 2:38 - SYSTEM,  SYSTEM  Problem added by Discern Expert      HLD (hyperlipidemia) (SNOMED CT  :78295621 )  Name of Problem:   HLD (hyperlipidemia) ; Recorder:   BELLEW, RN, KRISTY M; Confirmation:   Confirmed ; Classification:   Patient Stated ; Code:   30865784 ; Contributor System:   PowerChart ; Last Updated:   06/24/2017 16:16 EDT ; Life Cycle Date:   06/24/2017 ; Life Cycle Status:   Active ; Vocabulary:   SNOMED CT        HTN (hypertension) (SNOMED CT  :[phone] )  Name of Problem:   HTN (hypertension) ; Recorder:   Daphine Deutscher, RN, Apolinar Junes; Confirmation:   Confirmed ; Classification:   Medical ; Code:   [phone] ; Contributor System:   PowerChart ; Last Updated:   10/28/2015 8:22 EDT ;  Life Cycle Date:   10/28/2015 ; Life Cycle Status:   Active ; Vocabulary:   SNOMED CT        No contraindication to deep vein thrombosis (DVT) prophylaxis (SNOMED CT  :323557 )  Name of Problem:   No contraindication to deep vein thrombosis (DVT) prophylaxis ; Recorder:   [name], MD, Madlyn Frankel; Confirmation:   Confirmed ; Classification:   Medical ; Code:   322025 ; Contributor System:   PowerChart ; Last Updated:   12/28/2017 18:29 EDT ; Life Cycle Status:   Active ; Responsible Provider:   [name], MD, Madlyn Frankel; Vocabulary:   SNOMED CT        Vertigo (SNOMED CT  :[phone] )  Name of Problem:   Vertigo ; Recorder:   Domingo Sep; Confirmation:   Confirmed ; Classification:   Patient Stated ; Code:   [id number] ; Contributor System:  PowerChart ; Last Updated:   10/09/2020 14:17 EDT ; Life Cycle Date:   10/09/2020 ; Life Cycle Status:   Active ; Vocabulary:   SNOMED CT          Diagnoses(Active)    Chest pain  Date:   10/09/2020 ; Diagnosis Type:   Reason For Visit ; Confirmation:   Complaint of ; Clinical Dx:   Chest pain ; Classification:   Medical ; Clinical Service:   Emergency medicine ; Code:   PNED ; Probability:   0 ; Diagnosis Code:   [id number]      Elevated troponin  Date:   10/09/2020 ; Diagnosis Type:   Discharge ; Confirmation:   Confirmed ; Clinical Dx:   Elevated troponin ; Classification:   Medical ; Clinical Service:   Non-Specified ; Code:   ICD-10-CM ; Probability:   0 ; Diagnosis Code:   R77.8      Epigastric abdominal pain  Date:   10/09/2020 ; Diagnosis Type:   Discharge ; Confirmation:   Confirmed ; Clinical Dx:   Epigastric abdominal pain ; Classification:   Medical ; Clinical Service:   Non-Specified ; Code:   ICD-10-CM ; Probability:   0 ; Diagnosis Code:   R10.13      HLD (hyperlipidemia)  Date:   10/09/2020 ; Diagnosis Type:   Discharge ; Confirmation:   Confirmed ; Clinical Dx:   HLD (hyperlipidemia) ; Classification:   Patient Stated ; Clinical Service:    Non-Specified ; Code:   ICD-10-CM ; Probability:   0 ; Diagnosis Code:   E78.5      HTN (hypertension)  Date:   10/09/2020 ; Diagnosis Type:   Discharge ; Confirmation:   Confirmed ; Clinical Dx:   HTN (hypertension) ; Classification:   Medical ; Clinical Service:   Non-Specified ; Code:   ICD-10-CM ; Probability:   0 ; Diagnosis Code:   I10      Pulmonary embolism  Date:   10/09/2020 ; Diagnosis Type:   Discharge ; Confirmation:   Confirmed ; Clinical Dx:   Pulmonary embolism ; Classification:   Medical ; Clinical Service:   Non-Specified ; Code:   ICD-10-CM ; Probability:   0 ; Diagnosis Code:   I26.99        Procedure History   Recent Hospitalization Grid     Hospitalization #1          Date :    March 2019              Reason :    weakness, syncope                Domingo Sep - 10/09/2020 14:01 EDT              -    Procedure History   (As Of: 10/09/2020 14:17:46 EDT)     Anesthesia Minutes:   0 ; Procedure Name:   Exploratory laparotomy ; Procedure Minutes:   0 ; Comments:     12/28/2017 18:07 EDT - [name], RN, Hunt[place] Dec  Suspected fibroids, but did not find any.            ID Risk Screen Symptoms   Recent Travel History :   No recent travel   TB Symptom Screen :   No symptoms   Last 90 days COVID-19 ID :   No   Close Contact with COVID-19 ID :   No   Last 14 days COVID-19 ID :  No   C. diff Symptom/History ID :   Neither of the above   Patient Pregnant :   None of the above   MRSA/VRE Screening :   None of these apply   CRE Screening :   Not applicable   Domingo Sep - 10/09/2020 14:01 EDT   Bloodless Medicine   Is Blood Transfusion Acceptable to Patient :   Yes   Domingo Sep - 10/09/2020 17:03 EDT     Nutrition   MST Does Your Current Diet Include :   None   Nutrition Screen for Malnutrition :   Patient denies   Domingo Sep - 10/09/2020 14:01 EDT   Functional   Sensory Deficits :   None   ADLs Prior to Admission :   Independent   Domingo Sep - 10/09/2020 14:01 EDT   Social History    Social History   (As Of: 10/09/2020 14:17:46 EDT)   Tobacco:        Never smoker   (Last Updated: 10/28/2015 08:23:28 EDT by Daphine Deutscher RN, Apolinar Junes)          Electronic Cigarette/Vaping:        Never Electronic Cigarette Use.   (Last Updated: 10/09/2020 14:14:10 EDT by Domingo Sep)          Alcohol:        Denies, Alcohol use interferes with work or home: No.  Drinks more than intended: No.  No Seizures or DT's After Drinking.  Others hurt by drinking: No.  Ready to change: No.  Household alcohol concerns: No.   (Last Updated: 10/09/2020 14:14:24 EDT by Domingo Sep)          Substance Use:        Denies   (Last Updated: 10/09/2020 14:14:31 EDT by Domingo Sep)          Sexual:        Gender identity: Identifies as female.  Sexual orientation: Straight or heterosexual.   (Last Updated: 10/09/2020 14:14:50 EDT by Domingo Sep)            Spiritual   Faith/Denomination :   [employer]   Do you have a concern that you would like to address with a Chaplain? :   No   Do you have any religious/spiritual/cultural beliefs that could impact the way your care is provided? :   No   Lamar Benes,  Claudia-RN - 10/09/2020 14:01 EDT   Harm Screen   Suspect or Concern for: :   None   Feels Safe Where Live :   Yes   Agency(s)/Others notified :   No   Last 3 mo, thoughts killing self/others :   Patient denies   Cognitively Impaired :   No   Ambulatory or Self Mobile in [facility] :   Margretta Ditty - 10/09/2020 14:01 EDT   Education   Written Language :   [place]   Caregiver/Advocate Primary Language :   [place]   Caregiver/Advocate Written Language :   [place]   Primary Language :   Bea Graff,  Claudia-RN - 10/09/2020 14:01 EDT   Caregiver/Advocate Language   Patient :   Verbal explanation   Family :   Verbal explanation   Domingo Sep - 10/09/2020 14:01 EDT   Barriers to Learning :   None evident   Teaching Method :   Demonstration, Explanation   Responsible Learner Present for Session :  Yes   Additional  Session Learner(s) Present :   Aleene DavidsonFriend   Uzila,  Claudia-RN - 10/09/2020 14:01 EDT   Preventative Measures Information   Unit/Room Orientation :   Verbalizes understanding   Environmental Safety :   Verbalizes understanding   Hand Washing :   Verbalizes understanding   Infection Prevention :   Verbalizes understanding   DVT Prophylaxis :   Verbalizes understanding   Isolation Precaution :   Verbalizes understanding   Lamar BenesUzila,  Claudia-RN - 10/09/2020 14:01 EDT   DC Needs   CM Living Situation :   Home with no services   Home Caregiver Name/Relationship :   pt   Anticipated Discharge Needs :   Home health   Domingo SepUzila,  Claudia-RN - 10/09/2020 14:01 EDT   Valuables and Belongings   Valuables and Belongings   Sent Home :   Clothes, Jewelry, Purse/Wallet, Money, Glasses, Contact lenses, Hearing aid, Dentures, Cell phone, Electronic devices, Medical equipment, Medications   Domingo SepUzila,  Claudia-RN - 10/09/2020 14:01 EDT   Admission Complete   Admission Complete :   Yes   Domingo SepUzila,  Claudia-RN - 10/09/2020 14:01 EDT

## 2020-10-10 LAB — IMMATURE CELLS
IMMATURE PLT ABSOLUTE: 7 10*3/uL
IMMATURE PLT PERCENT: 4.8 % (ref 1.2–8.6)

## 2020-10-10 LAB — BASIC METABOLIC PANEL
Anion Gap: 11 mmol/L (ref 2–17)
BUN: 11 mg/dL (ref 8–23)
CO2: 26 mmol/L (ref 22–29)
Calcium: 8.7 mg/dL — ABNORMAL LOW (ref 8.8–10.2)
Chloride: 104 mmol/L (ref 98–107)
Creatinine: 0.8 mg/dL (ref 0.5–1.0)
Est, Glom Filt Rate: 70 mL/min/1.73m?? — ABNORMAL LOW (ref >=90)
Glucose: 97 mg/dL (ref 70–99)
OSMOLALITY CALCULATED: 281 mOsm/kg (ref 270–287)
Potassium: 3.9 mmol/L (ref 3.5–5.3)
Sodium: 141 mmol/L (ref 135–145)

## 2020-10-10 LAB — CBC
Hematocrit: 34.7 % (ref 34.0–47.0)
Hemoglobin: 12.2 g/dL (ref 11.5–15.7)
MCH: 28.9 pg (ref 27.0–34.5)
MCHC: 35.2 g/dL (ref 32.0–36.0)
MCV: 82.2 fL (ref 81.0–99.0)
MPV: 10.9 fL (ref 7.2–13.2)
NRBC Absolute: 0 10*3/uL (ref 0.000–0.012)
NRBC Automated: 0 % (ref 0.0–0.2)
Platelets: 145 10*3/uL (ref 140–440)
RBC: 4.22 x10e6/mcL (ref 3.60–5.20)
RDW: 13.4 % (ref 11.0–16.0)
WBC: 6.2 10*3/uL (ref 3.8–10.6)

## 2020-10-10 LAB — APTT
PTT: >200 seconds (ref 23.3–34.5)
PTT: 164.9 seconds (ref 23.3–34.5)

## 2020-10-10 NOTE — Progress Notes (Signed)
PT Time Spent with Patient Acute/OP-Text       PT Time Spent With Patient Outpatient/Acute Entered On:  10/10/2020 12:52 EDT    Performed On:  10/10/2020 12:52 EDT by Reginia Naas B-PT               Time Spent With Patient   PT Treatment Time Comment :   pt off floor in echo     PT Functional Training Time :   0 minutes   PT Total Timed Code Min :   0    PT Total Treatment Time Acute/OP :   0    Reginia Naas B-PT - 10/10/2020 12:52 EDT

## 2020-10-10 NOTE — Discharge Summary (Signed)
Inpatient Patient Summary              [facility]  [address] [place] [address]  [phone]  Discharge Instructions (Patient)  Name: Allison [name], Allison [name]  DOB:  08/14/1928                   MRN: [id number]                   FIN: [id number]  Reason For Visit: Chest pain; CP  Final Diagnosis: 1:Pulmonary embolism; 2:Acute deep vein thrombosis (DVT) of femoral vein of right lower extremity; 4:Vertigo; 5:Fall; 6:Epigastric abdominal pain; 7:HTN (hypertension); 8:HLD (hyperlipidemia); 9:Elevated troponin     Visit Date: 10/09/2020 13:19:18  Address: [address] [place] [address]  Phone: [phone]     Providers:         Primary Physician:      Carlean Purl      Admitting Providers: Caren Macadam W-MD  Attending Providers: Caren Macadam W-MD     [facility] would like to thank you for allowing [place] to assist you with your healthcare needs. The following includes patient education materials and information regarding your injury/illness.     Follow-up Instructions:  You were seen today on an emergency basis. Please contact your primary care doctor for a follow up appointment. If you received a referral to a specialist doctor, it is important you follow-up as instructed.    It is important that you call your follow-up doctor to schedule and confirm the location of your next appointment. Your doctor may practice at multiple locations. The office location of your follow-up appointment may be different to the one written on your discharge instructions.    If you do not have a primary care doctor, please call [phone] for help in finding a [facility] Provider. For help in finding a specialist doctor, please call [phone].    If your condition gets worse before your follow-up with your primary care doctor or specialist, please return to the Emergency Department.      Coronavirus 2019 (COVID-19) Reminders:     Patients age 102 - 21, with parental consent, and  patients over age 67 can make an appointment for a COVID-19 vaccine. Patients can contact their [name] doctors' offices to schedule an appointment to receive the COVID-19 vaccine. Patients who do not have a [name] physician can call [phone] to schedule vaccination appointments.      Follow Up Appointments:  Primary Care Provider:     Name: Silvana Newness L-MD     Phone: [phone]                 With: Address: When:   Silvana Newness L-MD [address] [place], [place] [address]  [phone] Within 1 week              Post Hospital Services  HOSPITAL SERVICES%>[facility] TO PROVIDE YOU HOME HEALTH SERVICES AT YOUR HOME WITHIN 72 HRS OF DISCHARGE. THEY CAN BE REACHED AT [phone].       Discharge Service Agency Selected  DISCHARGE SERVICE AGENCY SELECTED%>[facility]     DME Agency  DME AGENCY%>             New Medications  [facility] [phone], [address] [place], [place] [id number], [phone]  apixaban (apixaban 5 mg oral  tablet) Take 2 tablets twice daily until 7/5, then on 7/6 take 1 tablet twice daily. Refills: 5.  Last Dose:____________________  Other Medications  atorvastatin (atorvastatin 40 mg oral tablet) 1 Tabs Oral (given by mouth) every day.  Last Dose:____________________  Medications That Were Updated - Follow Current Instructions  Other Medications  Current: amLODIPine (amLODIPine 5 mg oral tablet) 1 Tabs Oral (given by mouth) every day.  Last Dose:____________________    Current: aspirin (aspirin 81 mg oral delayed release tablet) 1 Tabs Oral (given by mouth) every day.  Last Dose:____________________    Current: furosemide (furosemide 20 mg oral tablet) 0.5 - 1 tab Oral (given by mouth) every day as needed prn. for ankle swelling.  Last Dose:____________________    Current: lisinopril (lisinopril 20 mg oral tablet) 1 Tabs Oral (given by mouth) every day.  Last Dose:____________________    Current:  multivitamin with minerals (One-A-Day Women 50 Plus oral tablet) 1 Tabs Oral (given by mouth) every day.  Last Dose:____________________    Medications That Have Not Changed  Other Medications  acetaminophen (Tylenol Extra Strength 500 mg oral tablet) 2 Tabs Oral (given by mouth) every 4 hours as needed for pain.  Last Dose:____________________  ascorbic acid (Vitamin C 1000 mg oral tablet) 1 Tabs Oral (given by mouth) every day.  Last Dose:____________________  calcium carbonate (Tums 500 mg oral tablet, chewable) 2 Tabs Chewed every 4 days.  Last Dose:____________________  cholecalciferol (Vitamin D3 5000 intl units oral tablet) 1 Tabs Oral (given by mouth) every day.  Last Dose:____________________  meclizine (meclizine 25 mg oral tablet) 1 Tabs Oral (given by mouth) 3 times a day as needed as needed for dizziness.  Last Dose:____________________  Misc Medication (Leg cramp cream) 1 Application Topical (on the skin) every day as needed.  Last Dose:____________________  multivitamin (B-Complex with B-12 oral tablet) 1 Tabs Oral (given by mouth) every day.  Last Dose:____________________  omega-3 polyunsaturated fatty acids (Omega-3 Fish Oil 1000 mg oral capsule) 1 Capsules Oral (given by mouth) every day.  Last Dose:____________________      Allergy Info: Peaches; Strawberries; Chocolate; shellfish     Discharge Additional Information    Patient Education Materials:               Discharge Instructions for Pulmonary Embolism    A deep vein thrombosis (DVT) is a blood clot in a large vein deep in a leg, arm, or elsewhere in the body. The clot can separate from the vein, travel to the lungs and cut off blood flow. This is a pulmonary embolism (PE).??Pulmonary embolism is very serious and may cause death if the clot is large or there are multiple clots.??    ??   Home care   Taking care of yourself is very important. To help prevent more blood clots from forming, follow your healthcare provider's instructions. Do the  following:      ??Take your medicines exactly??as instructed. Don?t skip doses.??If you miss a dose, call your healthcare provider and ask what you should do.????      ??Have all lab tests as recommended. This is very important when you take medicines to prevent blood clots.??      ??If your healthcare provider has instructed you to do so, wear elastic (compression stockings).      ??Get up and get moving.     ??While sitting for long periods of time, move your knees, ankles, feet, and toes.     Lifestyle changes   To  help prevent problems with your heart and blood vessels, do the following:??      ??If you smoke, get help to quit. Talk with your healthcare provider about medicines and programs that can help.      ??Stay at a healthy weight.??Talk to your healthcare provider about losing weight, if you are overweight      ??Try to exercise at least 30 minutes on most days. Before starting an exercise program,??talk with your healthcare provider.??      ??When traveling by car, make frequent stops??to??get??up??and move around.      ??On long airplane rides,??get up and move around when possible.??If you can?t get up, wiggle your toes, move your ankles and tighten your calves to keep your blood moving.     Follow-up care   Make a follow-up appointment as directed. Have your lab work done as directed.    When to call your healthcare provider   Call your healthcare provider right away??if you have:      ??Pain, swelling, and redness in your leg, arm, or other body area. These symptoms may mean another blood clot.      ??Blood in your urine     ??Bleeding with bowel movements      ??Bleeding from the nose, gums, a cut, or vagina     Call 911   Call?? 911 if you have??symptoms of a blood clot in the lungs:??      ??Chest pain     ??Trouble breathing     ??Coughing (may cough up blood)      ??Fast heartbeat     ??Sweating     ??Fainting    Also call 911 if you have:      ??Heavy or uncontrolled bleeding. If you are taking a blood thinner, you have an increased  chance of bleeding.        ?? 2000-2021 [employer] [place]. All rights reserved. This information is not intended as a substitute for professional medical care. Always follow your healthcare professional's instructions.     ---------------------------------------------------------------------------------------------------------------------  [facility] allows patients to review your COVID and other test results as well as discharge documents from any [facility], Emergency Department, surgical center or outpatient lab. Test results are typically available 36 hours after the test is completed.     [name] encourages you to self-enroll in the [facility] Patient Portal.     To begin your self-enrollment process, please visit [email or web address] Under [facility], click on ???Sign up now???.     NOTE: You must be 16 years and older to use [facility] Self-Enroll online. If you are a parent, caregiver, or guardian; you need an invite to access your child???s or dependent???s health records. To obtain an invite, contact the Medical Records department at [phone] Monday through Friday, 8-4:30, select option 3 . If we receive your call afterhours, we will return your call the next business day.     If you have issues trying to create or access your account, contact [employer] support at [phone] available 7 days a week 24 hours a day.     Comment:

## 2020-10-10 NOTE — Consults (Signed)
Pastoral Care Consultation               Spiritual Assessment   Reason for Consult:  Palliative   Patient was alert and sitting up when I visited.  Her granddaughter, Allison [name], was present with her.  Pt is very kind and enjoys telling stories about her life.  She has a lot of support and is very well cared for.  She expressed that she was ready to go home as soon as the doctors let her.  Pt is a joy to visit with.  We said a prayer together before I left.     _ Spiritual Concern  _ Spiritual Distress  _ Spiritual Isolation  _ Spiritual Despair  X Spiritual Well-being    Intervention(s)    X Presence    X Prayer    X Active Listening    _ Reading Sacred Texts    _ Coordination of Care    _ Meditation    X Life Review    _ Guided Breathing    _ Music    _ Imagery   _ Comfort basket ordered  _ Legacy project completed    Coordination of Care:  _ Debriefed with RN  _ Debriefed with Interdisciplinary Team  _ Participated in Interdisciplinary Team rounds  _ Participated in family meeting/ goals of care conversation  _ Eucharistic Minister notified  _ Community clergy notified    Outcome(s)  X Life Review Satisfaction  _ Anxiety Reduction  _ Address Spiritual Issue  X Established rapport with patient/ family  _ Completed Advanced Directive  _ Completed Legacy Project     Plan of Care   Follow Up:  As requested    Faith/Denomination: Christian  Advanced Directives: Yes  In Charge of News (ICON) Name: Allison [name] [phone]  Resuscitation Status:  Do Not Resuscitate    Signature Line     Electronically Signed on 10/10/2020 11:23 AM EDT   ________________________________________________   Hyman Bower

## 2020-10-10 NOTE — Case Communication (Signed)
CM Discharge Planning Assessment - Text       CM Discharge Plan Entered On:  10/31/2020 14:00 EDT    Performed On:  October 31, 2020 13:57 EDT by Jamse Arn,  Mona-MSW               CM Discharge Plan   Discharge To :   Family support, Home Health   CM Discharge Services :   Home health service   Caroline Arranged :   Home health aide, Nursing, Occupational Therapy, Physical Therapy   CM Discharge Service Agency Selected :   [facility]    CM Choice Reason :   Patient preference   Current Equipment and Treatments :   Allison [name], Rollator   CM Home/Lay Caregiver Name/Relationship :   Allison [name]- granddaughter   Allison [name]- grandson   CM Home/Lay Caregiver Contact Number :   [phone]  [place] :   [place] AT [phone].      CM Medicare Patient :   Yes   CM 3 MN Stay Validated :   No   CM StCM Statusatus :   OBS   CM Progress Note :   Oct 31, 2020 MB- Admitted for pulmonary embolus. Per md notes, pt with pmh of HTN, HLD, diastolic CHF, and vertigo. Pt is rarely hospitalized and was lastly hospitalized in 2019 for HTN and weakness. CM met with pt and her granddaughter Tabitha bedside, introduced self and purpose for the visit. Pt is alert and oriented x 4. Pt lives at home, and has caregivers around-the-clock. Pt.  was in her home when she fell, hit her head and twisted around to hit the back of her head. Her caregiver and neighbor helped pick her up. Pt has a rollator , cane and shower chair. Pt. has no current home health services and would benefit from it. Proposed HH orders to MD. CM discussed home health options with granddtr and she would like [facility]. Referral sent to [facility] via [facility]. Pt uses [facility] for her rx needs. Pt's PCP is [facility]. PT/OT attempted to eval pt, however she had left for an echo. MOON letter reviewed with pt and her grand daughter,  provided verbal consent, provided grand dtr with a copy, copy placed in chart and documented. Pt's spouse is a English as a second language teacher. Disposition of needs pending clinical course. CM to follow.        Discharge Planning Assessment Complete :   Yes   Balooch,  Mona-MSW - 31-Oct-2020 13:57 EDT   Z Codes   Z code documentation :   Not applicable   Balooch,  Mona-MSW - 2020-10-31 13:57 EDT

## 2020-10-10 NOTE — Discharge Summary (Signed)
Inpatient Clinical Summary             [facility]  Post-Acute Care Transfer Instructions  PERSON INFORMATION   Name: Allison [name], Allison [name]   MRN: [id number]    FIN#: [id number]   PHYSICIANS  Admitting Physician: Caren Macadam W-MD  Attending Physician: Caren Macadam W-MD   PCP: Silvana Newness L-MD  Discharge Diagnosis: 1:Pulmonary embolism; 2:Acute deep vein thrombosis (DVT) of femoral vein of right lower extremity; 4:Vertigo; 5:Fall; 6:Epigastric abdominal pain; 7:HTN (hypertension); 8:HLD (hyperlipidemia); 9:Elevated troponin  Comment:       PATIENT EDUCATION INFORMATION  Instructions:             Pulmonary Embolism, Discharge Instructions  Medication Leaflets:               Follow-up:                           With: Address: When:   Silvana Newness L-MD [address] [place], [place] [address]  [phone] Within 1 week                             MEDICATION LIST  Medication Reconciliation at Discharge:          New Medications  [facility] [phone], [address] [place], [place] [id number], [phone]  apixaban (apixaban 5 mg oral tablet) Take 2 tablets twice daily until 7/5, then on 7/6 take 1 tablet twice daily. Refills: 5.  Last Dose:____________________  Other Medications  atorvastatin (atorvastatin 40 mg oral tablet) 1 Tabs Oral (given by mouth) every day.  Last Dose:____________________  Medications That Were Updated - Follow Current Instructions  Other Medications  Current: amLODIPine (amLODIPine 5 mg oral tablet) 1 Tabs Oral (given by mouth) every day.  Last Dose:____________________    Current: aspirin (aspirin 81 mg oral delayed release tablet) 1 Tabs Oral (given by mouth) every day.  Last Dose:____________________    Current: furosemide (furosemide 20 mg oral tablet) 0.5 - 1 tab Oral (given by mouth) every day as needed prn. for ankle swelling.  Last Dose:____________________    Current: lisinopril (lisinopril 20 mg oral tablet) 1 Tabs Oral (given by mouth) every day.  Last  Dose:____________________    Current: multivitamin with minerals (One-A-Day Women 50 Plus oral tablet) 1 Tabs Oral (given by mouth) every day.  Last Dose:____________________    Medications That Have Not Changed  Other Medications  acetaminophen (Tylenol Extra Strength 500 mg oral tablet) 2 Tabs Oral (given by mouth) every 4 hours as needed for pain.  Last Dose:____________________  ascorbic acid (Vitamin C 1000 mg oral tablet) 1 Tabs Oral (given by mouth) every day.  Last Dose:____________________  calcium carbonate (Tums 500 mg oral tablet, chewable) 2 Tabs Chewed every 4 days.  Last Dose:____________________  cholecalciferol (Vitamin D3 5000 intl units oral tablet) 1 Tabs Oral (given by mouth) every day.  Last Dose:____________________  meclizine (meclizine 25 mg oral tablet) 1 Tabs Oral (given by mouth) 3 times a day as needed as needed for dizziness.  Last Dose:____________________  Misc Medication (Leg cramp cream) 1 Application Topical (on the skin) every day as needed.  Last Dose:____________________  multivitamin (B-Complex with B-12 oral tablet) 1 Tabs Oral (given by mouth) every day.  Last Dose:____________________  omega-3 polyunsaturated fatty acids (Omega-3 Fish Oil 1000 mg oral capsule) 1 Capsules Oral (given by mouth)  every day.  Last Dose:____________________         Patient???s Final Home Medication List Upon Discharge:           acetaminophen (Tylenol Extra Strength 500 mg oral tablet) 2 Tabs Oral (given by mouth) every 4 hours as needed for pain.  amLODIPine (amLODIPine 5 mg oral tablet) 1 Tabs Oral (given by mouth) every day.  apixaban (apixaban 5 mg oral tablet) Take 2 tablets twice daily until 7/5, then on 7/6 take 1 tablet twice daily. Refills: 5.  ascorbic acid (Vitamin C 1000 mg oral tablet) 1 Tabs Oral (given by mouth) every day.  aspirin (aspirin 81 mg oral delayed release tablet) 1 Tabs Oral (given by mouth) every day.  atorvastatin (atorvastatin 40 mg oral tablet) 1 Tabs Oral (given by  mouth) every day.  calcium carbonate (Tums 500 mg oral tablet, chewable) 2 Tabs Chewed every 4 days.  cholecalciferol (Vitamin D3 5000 intl units oral tablet) 1 Tabs Oral (given by mouth) every day.  furosemide (furosemide 20 mg oral tablet) 0.5 - 1 tab Oral (given by mouth) every day as needed prn. for ankle swelling.  lisinopril (lisinopril 20 mg oral tablet) 1 Tabs Oral (given by mouth) every day.  meclizine (meclizine 25 mg oral tablet) 1 Tabs Oral (given by mouth) 3 times a day as needed as needed for dizziness.  Misc Medication (Leg cramp cream) 1 Application Topical (on the skin) every day as needed.  multivitamin (B-Complex with B-12 oral tablet) 1 Tabs Oral (given by mouth) every day.  multivitamin with minerals (One-A-Day Women 50 Plus oral tablet) 1 Tabs Oral (given by mouth) every day.  omega-3 polyunsaturated fatty acids (Omega-3 Fish Oil 1000 mg oral capsule) 1 Capsules Oral (given by mouth) every day.         Comment:       ORDERS          Order Name Order Details

## 2020-10-10 NOTE — Progress Notes (Signed)
OT Time Spent with Patient Acute/OP-Text       OT Time Spent With Patient Outpatient/Acute Entered On:  10/10/2020 13:26 EDT    Performed On:  10/10/2020 9:00 EDT by Pedro Earls, OT, Lang Snow               Time Spent With Patient   OT Treatment Time Comment :   Pt off floor at echo, will re-attempt as time allows      OT Functional Training Time :   0 minutes   OT Total Timed Code Treatment Minutes :   0 minutes   OT Total Treatment Time :   0 minutes   Lind Guest - 10/10/2020 13:26 EDT

## 2020-10-10 NOTE — Case Communication (Signed)
CM D/C Planning Assessment Ongoing- Text       CM Admission Assessment Entered On:  2020-10-15 13:47 EDT    Performed On:  10/15/2020 13:41 EDT by Jamse Arn,  Mona-MSW               CM Admission Assessment   CM Admission Assessment Complete :   Yes   Balooch,  Mona-MSW - 2020-10-15 13:49 EDT   CM Reason for Care Management Referral :   Assess support system, Discharge planning assessment   CM Insurance Information :   [employer]   CM Insurance Co. Name :   Medicare  [employer]   CM Prescription Coverage :   Commercial   CM Name of Patient Pharmacy :   [facility] [address] [phone]   CM Date and Time Observation Order :   10/09/2020 7:41 EDT   CM Date and Time Moon Given :   15-Oct-2020 13:34 EDT   CM Patient has PCP :   Yes   CM PCP Search :   Almyra Deforest L-MD   Discharge Transporation Needs :   No   CM Assessment Discussion With :   Patient   CM Home/Lay Caregiver Name/Relationship :   Lawerance Bach- granddaughter   Lance Coon- grandson   CM Home/Lay Caregiver Contact Number :   [phone]  [phone]   CM Living Situation :   Home with no services, Family support   CM Veteran :   Spouse of   Current Equipment and Treatments :   Kasandra Knudsen, Rollator   CM Initial Tentative Discharge Plan :   Home with home health   CM Alternate Discharge Plan :   Pending clinical course   Anticipated Hosp Related Barriers to DC :   None identified   CM Home Barriers :   None   Balooch,  Mona-MSW - 15-Oct-2020 13:41 EDT   CM Progress Note :   Oct 15, 2020 MB- Admitted for pulmonary embolus. Per md notes, pt with pmh of HTN, HLD, diastolic CHF, and vertigo. Pt is rarely hospitalized and was lastly hospitalized in 2019 for HTN and weakness. CM met with pt and her granddaughter Tabitha bedside, introduced self and purpose for the visit. Pt is alert and oriented x 4. Pt lives at home, and has caregivers around-the-clock. Pt.  was in her home when she fell, hit her head and twisted around to hit the back of her head. Her caregiver and  neighbor helped pick her up. Pt has a rollator , cane and shower chair. Pt. has no current home health services and would benefit from it. Proposed HH orders to MD. CM discussed home health options with granddtr and she would like [facility]. Referral sent to [facility] via [facility]. Pt uses [facility] for her rx needs. Pt's PCP is [facility]. PT/OT attempted to eval pt, however she had left for an echo. MOON letter reviewed with pt and her grand daughter, provided verbal consent, provided grand dtr with a copy, copy placed in chart and documented. Pt's spouse is a English as a second language teacher. Disposition of needs pending clinical course. CM to follow.        Balooch,  Mona-MSW - 2020-10-15 13:49 EDT   Z Codes   Z code documentation :   Not applicable   Balooch,  Mona-MSW - October 15, 2020 13:49 EDT

## 2020-10-10 NOTE — Nursing Note (Signed)
Nursing Discharge Summary - Text       Nursing Discharge Summary Entered On:  10/10/2020 15:05 EDT    Performed On:  10/10/2020 15:03 EDT by Domingo Sep               DC Information   Discharge To :   Family support, Home Health   Mode of Discharge :   Wheelchair   Transportation :   Private vehicle   Accompanied By :   Patriciaann Clan,  Claudia-RN - 10/10/2020 15:03 EDT   Education   Responsible Learner(s) :   Home Caregiver Name/Relationship: pt        Performed by: Domingo Sep - 10/10/2020 10:39     Home Caregiver Present for Session :   Yes   Barriers To Learning :   None evident   Teaching Method :   Demonstration, Explanation   Domingo Sep - 10/10/2020 15:03 EDT   Post-Hospital Education Adult Grid   Activity Expectations :   [employer] understanding   Community Resources :   [employer] understanding   Diagnostic Results :   TEFL teacher understanding   Disease Process :   Verbalizes understanding   Plan of Care :   Verbalizes understanding   Domingo Sep - 10/10/2020 15:03 EDT   Health Maintenance Education Adult Grid   Allergies :   Verbalizes understanding   Bathing/Hygiene :   Demonstrates   Diet/Nutrition :   Verbalizes understanding   Lamar Benes,  Claudia-RN - 10/10/2020 15:03 EDT   Medication Education Adult Grid   Med Dosage, Route, Scheduling :   TEFL teacher understanding   Med Generic/Brand Name, Purpose, Action :   Verbalizes understanding   Medication Precautions :   Verbalizes understanding   Domingo Sep - 10/10/2020 15:03 EDT   Safety Education Adult Grid   Safety, Fall :   Verbalizes understanding   Domingo Sep - 10/10/2020 15:03 EDT   Education Referral Made To :   Home Health   Additional Learner(s) Present :   Friend   Time Spent Educating Patient :   40 minutes   Domingo Sep - 10/10/2020 15:03 EDT

## 2020-10-10 NOTE — Case Communication (Signed)
Face to Face Encounter Document - Text       Face to Face Encounter Documentation Entered On:  10/10/2020 14:02 EDT    Performed On:  10/10/2020 14:02 EDT by Caren Macadam W-MD               Face to Face Encounter   [facility] Certification Statement :   I had a face to face encounter with this patient that meets the provider face to face requirements, As the ordering provider, I attest to the evaluation, assessment, treatment and follow-up in relation to both the patient and to the home health services ordered.   HH Skilled Nursing Needs :   Pain management & teaching, Respiratory/cardiac assessment & teaching, Disease management & teaching, Wound assessment, care & teaching, Medication management & teaching, Infection requiring monitoring, teaching &/or medication administration, Monitoring of laboratory values   HH Type of Therapy Service Needed :   PT, OT   HH Reason for Therapy Service :   Gait instability, fall risk, decreased mobility or ROM, Functional limitations, Cognitive, comprehension, expressive &/or swallowing issues   HH Reason Patient is Homebound :   Leaving home requires a considerable & taxing effort, Absences from home are to receive healthcare treatment & are infrequent, Pain impairs mobility, Medical restriction due to infectious illness, immunosuppression, risk of injury or illness.   Caren Macadam W-MD - 10/10/2020 14:02 EDT

## 2020-10-10 NOTE — Nursing Note (Signed)
Nursing Discharge Summary - Text       Physician Discharge Summary Entered On:  10/10/2020 14:26 EDT    Performed On:  10/10/2020 14:25 EDT by Caren Macadam W-MD               DC Information   Provider Instructions for Diet :   A Healthy Diet, No Restrictions   Provider Instructions for Activity :   As Tolerated, Continue your regular activity, Other: use cane to walk. Walk long distances with a friend at your side.   Caren Macadam W-MD - 10/10/2020 14:25 EDT

## 2021-01-16 LAB — URINALYSIS WITH REFLEX TO CULTURE
Amorphous, UA: NONE SEEN /HPF
Bilirubin Urine: NEGATIVE
Glucose, UA: NEGATIVE
Ketones, Urine: NEGATIVE
Leukocyte Esterase, Urine: NEGATIVE
MUCUS, URINE: NONE SEEN /LPF
Nitrite, Urine: NEGATIVE
Protein, UA: NEGATIVE
RBC, UA: NONE SEEN /HPF (ref 0–2)
Specific Gravity, UA: 1.01 (ref 1.003–1.035)
Urobilinogen, Urine: 0.2 EU/dL
pH, UA: 7 (ref 4.5–8.0)

## 2021-01-16 LAB — COMPREHENSIVE METABOLIC PANEL
ALT: 11 U/L (ref 0–35)
AST: 20 U/L (ref 0–35)
Albumin/Globulin Ratio: 1.43 (ref 1.00–2.70)
Albumin: 4 g/dL (ref 3.5–5.2)
Alk Phosphatase: 58 U/L (ref 35–117)
Anion Gap: 10 mmol/L (ref 2–17)
BUN: 12 mg/dL (ref 8–23)
CO2: 29 mmol/L (ref 22–29)
Calcium: 9 mg/dL (ref 8.8–10.2)
Chloride: 102 mmol/L (ref 98–107)
Creatinine: 0.8 mg/dL (ref 0.5–1.0)
Est, Glom Filt Rate: 70 mL/min/1.73m?? — ABNORMAL LOW (ref >=90)
Globulin: 2.8 g/dL (ref 1.9–4.4)
Glucose: 88 mg/dL (ref 70–99)
OSMOLALITY CALCULATED: 280 mOsm/kg (ref 270–287)
Potassium: 4 mmol/L (ref 3.5–5.3)
Sodium: 141 mmol/L (ref 135–145)
Total Bilirubin: 0.3 mg/dL (ref 0.00–1.20)
Total Protein: 6.8 g/dL (ref 6.4–8.3)

## 2021-01-16 LAB — CBC
Hematocrit: 36.9 % (ref 34.0–47.0)
Hemoglobin: 12.9 g/dL (ref 11.5–15.7)
MCH: 28.5 pg (ref 27.0–34.5)
MCHC: 35 g/dL (ref 32.0–36.0)
MCV: 81.5 fL (ref 81.0–99.0)
MPV: 10.7 fL (ref 7.2–13.2)
NRBC Absolute: 0 10*3/uL (ref 0.000–0.012)
NRBC Automated: 0 % (ref 0.0–0.2)
Platelets: 174 10*3/uL (ref 140–440)
RBC: 4.53 x10e6/mcL (ref 3.60–5.20)
RDW: 13.2 % (ref 11.0–16.0)
WBC: 5.8 10*3/uL (ref 3.8–10.6)

## 2021-01-16 LAB — LIPASE: Lipase: 12 U/L — ABNORMAL LOW (ref 13–60)

## 2021-01-16 NOTE — ED Notes (Signed)
ED Patient Education Note     Patient Education Materials Follows:  Nephrology     Peripheral Edema      Peripheral edema is swelling that is caused by a buildup of fluid. Peripheral edema most often affects the lower legs, ankles, and feet. It can also develop in the arms, hands, and face. The area of the body that has peripheral edema will look swollen. It may also feel heavy or warm. Your clothes may start to feel tight. Pressing on the area may make a temporary dent in your skin. You may not be able to move your swollen arm or leg as much as usual.    There are many causes of peripheral edema. It can happen because of a complication of other conditions such as congestive heart failure, kidney disease, or a problem with your blood circulation. It also can be a side effect of certain medicines or because of an infection. It often happens to women during pregnancy. Sometimes, the cause is not known.      Follow these instructions at home:    Managing pain, stiffness, and swelling       Raise (elevate) your legs while you are sitting or lying down.     Move around often to prevent stiffness and to lessen swelling.     Do not sit or stand for long periods of time.     Wear support stockings as told by your health care provider.      Medicines     Take over-the-counter and prescription medicines only as told by your health care provider.     Your health care provider may prescribe medicine to help your body get rid of excess water (diuretic).      General instructions     Pay attention to any changes in your symptoms.     Follow instructions from your health care provider about limiting salt (sodium) in your diet. Sometimes, eating less salt may reduce swelling.     Moisturize skin daily to help prevent skin from cracking and draining.     Keep all follow-up visits as told by your health care provider. This is important.        Contact a health care provider if you have:     A fever.     Edema that starts suddenly  or is getting worse, especially if you are pregnant or have a medical condition.     Swelling in only one leg.     Increased swelling, redness, or pain in one or both of your legs.     Drainage or sores at the area where you have edema.      Get help right away if you:     Develop shortness of breath, especially when you are lying down.     Have pain in your chest or abdomen.     Feel weak.     Feel faint.      Summary     Peripheral edema is swelling that is caused by a buildup of fluid. Peripheral edema most often affects the lower legs, ankles, and feet.     Move around often to prevent stiffness and to lessen swelling. Do not sit or stand for long periods of time.     Pay attention to any changes in your symptoms.     Contact a health care provider if you have edema that starts suddenly or is getting worse, especially if you are pregnant or have a   medical condition.     Get help right away if you develop shortness of breath, especially when lying down.      This information is not intended to replace advice given to you by your health care provider. Make sure you discuss any questions you have with your health care provider.      Document Revised: 12/23/2017 Document Reviewed: 12/23/2017  Elsevier Patient Education ? 2021 Elsevier Inc.

## 2021-01-16 NOTE — ED Notes (Signed)
ED Triage Note       ED Secondary Triage Entered On:  01/16/2021 17:03 EDT    Performed On:  01/16/2021 17:02 EDT by Sofie Rower               General Information   Barriers to Learning :   None evident   ED Home Meds Section :   Document assessment   [facility] ED Fall Risk Section :   Document assessment   ED Advance Directives Section :   Document assessment   ED Palliative Screen :   Document assessment   Sofie Rower - 01/16/2021 17:02 EDT   (As Of: 01/16/2021 17:03:11 EDT)   Problems(Active)    At risk for falls (SNOMED CT  :[id number] )  Name of Problem:   At risk for falls ; Onset Date:   06/25/2017 ; Recorder:   SYSTEM,  SYSTEM; Confirmation:   Confirmed ; Classification:   Interdisciplinary ; Code:   [id number] ; Last Updated:   06/25/2017 2:38 EDT ; Life Cycle Date:   06/25/2017 ; Life Cycle Status:   Active ; Vocabulary:   SNOMED CT   ; Comments:        06/25/2017 2:38 - SYSTEM,  SYSTEM  Problem added by Discern Expert      HLD (hyperlipidemia) (SNOMED CT  :97026378 )  Name of Problem:   HLD (hyperlipidemia) ; Recorder:   BELLEW, RN, KRISTY M; Confirmation:   Confirmed ; Classification:   Patient Stated ; Code:   [id number] ; Contributor System:   PowerChart ; Last Updated:   06/24/2017 16:16 EDT ; Life Cycle Date:   06/24/2017 ; Life Cycle Status:   Active ; Vocabulary:   SNOMED CT        HTN (hypertension) (SNOMED CT  :[phone] )  Name of Problem:   HTN (hypertension) ; Recorder:   Daphine Deutscher, RN, Apolinar Junes; Confirmation:   Confirmed ; Classification:   Medical ; Code:   [phone] ; Contributor System:   PowerChart ; Last Updated:   10/28/2015 8:22 EDT ; Life Cycle Date:   10/28/2015 ; Life Cycle Status:   Active ; Vocabulary:   SNOMED CT        No contraindication to deep vein thrombosis (DVT) prophylaxis (SNOMED CT  :665993 )  Name of Problem:   No contraindication to deep vein thrombosis (DVT) prophylaxis ; Recorder:   [name], MD,  Madlyn Frankel; Confirmation:   Confirmed ; Classification:   Medical ; Code:   [id number] ; Contributor System:   PowerChart ; Last Updated:   12/28/2017 18:29 EDT ; Life Cycle Status:   Active ; Responsible Provider:   [name], MD, Madlyn Frankel; Vocabulary:   SNOMED CT        Vertigo (SNOMED CT  [phone] )  Name of Problem:   Vertigo ; Recorder:   Domingo Sep; Confirmation:   Confirmed ; Classification:   Patient Stated ; Code:   [id number] ; Contributor System:   PowerChart ; Last Updated:   10/09/2020 14:17 EDT ; Life Cycle Date:   10/09/2020 ; Life Cycle Status:   Active ; Vocabulary:   SNOMED CT          Diagnoses(Active)    Leg pain-swelling  Date:   01/16/2021 ; Diagnosis Type:   Reason For Visit ; Confirmation:   Complaint of ; Clinical Dx:   Leg pain-swelling ; Classification:   Medical ; Clinical Service:   Emergency medicine ; Code:  PNED ; Probability:   0 ; Diagnosis Code:   [id number]             -    Procedure History   (As Of: 01/16/2021 17:03:11 EDT)     Anesthesia Minutes:   0 ; Procedure Name:   Exploratory laparotomy ; Procedure Minutes:   0 ; Comments:     12/28/2017 18:07 EDT - [name], RN, Huntley Dec  Suspected fibroids, but did not find any.            [facility] Fall Risk Assessment Tool   Hx of falling last 3 months ED Fall :   No   Patient confused or disoriented ED Fall :   No   Patient intoxicated or sedated ED Fall :   No   Patient impaired gait ED Fall :   Yes   Use a mobility assistance device ED Fall :   Yes   Patient altered elimination ED Fall :   No   [facility] ED Fall Score :   2    Sofie Rower - 01/16/2021 17:02 EDT   ED Advance Directive   Advance Directive :   Yes   Sofie Rower - 01/16/2021 17:02 EDT   Palliative Care   Does the Patient have a Life Limiting Illness :   None of the above   Sofie Rower - 01/16/2021 17:02 EDT   Med Hx   Medication List   (As Of: 01/16/2021 17:03:11 EDT)   Normal Order    iopamidol 61% Inj Soln  100 mL  :   iopamidol 61% Inj Soln 100 mL ; Status:   Ordered ; Ordered As Mnemonic:   Isovue-300 ; Simple Display Line:   100 mL, IV Contrast, On Call ; Ordering Provider:   DITTRICH, MD, Jesse Sans; Catalog Code:   iopamidol ; Order Dt/Tm:   01/16/2021 16:21:03 EDT          Sodium Chloride 0.9% intravenous solution Bolus  :   Sodium Chloride 0.9% intravenous solution Bolus ; Status:   Completed ; Ordered As Mnemonic:   Sodium Chloride 0.9% bolus ; Simple Display Line:   1,000 mL, 2000 mL/hr, IV Piggyback, Once ; Ordering Provider:   Dorena Dew, MD, THOMAS C; Catalog Code:   Sodium Chloride 0.9% ; Order Dt/Tm:   01/16/2021 16:20:55 EDT            Prescription/Discharge Order    apixaban  :   apixaban ; Status:   Prescribed ; Ordered As Mnemonic:   apixaban 5 mg oral tablet ; Simple Display Line:   See Instructions, Take 2 tablets twice daily until 7/5, then on 7/6 take 1 tablet twice daily, 120 tabs, 5 Refill(s) ; Ordering Provider:   Caren Macadam W-MD; Catalog Code:   apixaban ; Order Dt/Tm:   10/10/2020 14:22:34 EDT            Home Meds    acetaminophen  :   acetaminophen ; Status:   Documented ; Ordered As Mnemonic:   Tylenol Extra Strength 500 mg oral tablet ; Simple Display Line:   1,000 mg, 2 tabs, Oral, q4hr, PRN: for pain, 0 Refill(s) ; Catalog Code:   acetaminophen ; Order Dt/Tm:   10/09/2020 14:38:58 EDT          amLODIPine  :   amLODIPine ; Status:   Documented ; Ordered As Mnemonic:   amLODIPine 5 mg oral tablet ; Simple Display Line:  5 mg, 1 tabs, Oral, Daily, 0 Refill(s) ; Catalog Code:   amLODIPine ; Order Dt/Tm:   10/09/2020 14:32:36 EDT          ascorbic acid  :   ascorbic acid ; Status:   Documented ; Ordered As Mnemonic:   Vitamin C 1000 mg oral tablet ; Simple Display Line:   1,000 mg, 1 tabs, Oral, Daily, 0 Refill(s) ; Catalog Code:   ascorbic acid ; Order Dt/Tm:   10/09/2020 14:35:33 EDT          aspirin  :   aspirin ; Status:   Documented ; Ordered As Mnemonic:   aspirin 81 mg oral delayed release  tablet ; Simple Display Line:   81 mg, 1 tabs, Oral, Daily, 0 Refill(s) ; Catalog Code:   aspirin ; Order Dt/Tm:   10/09/2020 14:32:19 EDT          atorvastatin  :   atorvastatin ; Status:   Documented ; Ordered As Mnemonic:   atorvastatin 40 mg oral tablet ; Simple Display Line:   40 mg, 1 tabs, Oral, Daily, 0 Refill(s) ; Ordering Provider:   Caren Macadam W-MD; Catalog Code:   atorvastatin ; Order Dt/Tm:   10/10/2020 14:23:55 EDT          calcium carbonate  :   calcium carbonate ; Status:   Documented ; Ordered As Mnemonic:   Tums 500 mg oral tablet, chewable ; Simple Display Line:   1,000 mg, 2 tabs, Chewed, q4day, 0 Refill(s) ; Catalog Code:   calcium carbonate ; Order Dt/Tm:   10/09/2020 14:36:00 EDT          cholecalciferol  :   cholecalciferol ; Status:   Documented ; Ordered As Mnemonic:   Vitamin D3 5000 intl units oral tablet ; Simple Display Line:   125 mcg, 1 tabs, Oral, Daily, 0 Refill(s) ; Catalog Code:   cholecalciferol ; Order Dt/Tm:   10/09/2020 14:34:46 EDT          furosemide  :   furosemide ; Status:   Documented ; Ordered As Mnemonic:   furosemide 20 mg oral tablet ; Simple Display Line:   0.5 - 1 tab, Oral, Daily, for ankle swelling, PRN: prn, 0 Refill(s) ; Catalog Code:   furosemide ; Order Dt/Tm:   10/09/2020 14:32:50 EDT          lisinopril  :   lisinopril ; Status:   Documented ; Ordered As Mnemonic:   lisinopril 20 mg oral tablet ; Simple Display Line:   20 mg, 1 tabs, Oral, Daily, 0 Refill(s) ; Catalog Code:   lisinopril ; Order Dt/Tm:   10/09/2020 14:41:48 EDT          meclizine  :   meclizine ; Status:   Documented ; Ordered As Mnemonic:   meclizine 25 mg oral tablet ; Simple Display Line:   25 mg, 1 tabs, Oral, TID, PRN: as needed for dizziness, 0 Refill(s) ; Catalog Code:   meclizine ; Order Dt/Tm:   10/09/2020 14:34:24 EDT          Misc Medication  :   Misc Medication ; Status:   Documented ; Ordered As Mnemonic:   Leg cramp cream ; Simple Display Line:   1 app, Topical, Daily, PRN, 0  Refill(s) ; Catalog Code:   Misc Medication ; Order Dt/Tm:   10/09/2020 14:36:24 EDT          multivitamin  :   multivitamin ;  Status:   Documented ; Ordered As Mnemonic:   B-Complex with B-12 oral tablet ; Simple Display Line:   1 tabs, Oral, Daily, 0 Refill(s) ; Catalog Code:   multivitamin ; Order Dt/Tm:   10/09/2020 14:35:12 EDT          multivitamin with minerals  :   multivitamin with minerals ; Status:   Documented ; Ordered As Mnemonic:   One-A-Day Women 50 Plus oral tablet ; Simple Display Line:   1 tabs, Oral, Daily, 0 Refill(s) ; Catalog Code:   multivitamin with minerals ; Order Dt/Tm:   06/25/2017 12:54:24 EDT          omega-3 polyunsaturated fatty acids  :   omega-3 polyunsaturated fatty acids ; Status:   Documented ; Ordered As Mnemonic:   Omega-3 Fish Oil 1000 mg oral capsule ; Simple Display Line:   1,000 mg, 1 caps, Oral, Daily, 0 Refill(s) ; Catalog Code:   omega-3 polyunsaturated fatty acids ; Order Dt/Tm:   10/09/2020 14:35:52 EDT

## 2021-01-16 NOTE — ED Notes (Signed)
 ED Patient Summary       ;       [facility] Emergency Department  [address], [place] [address]  [phone]  Discharge Instructions (Patient)  Name: Allison [name], Allison [name]  DOB: [birth year]                   MRN: [id number]                   FIN: [phone]  Reason For Visit: Leg pain-swelling; LEG SWELLING  Final Diagnosis: Abdominal pain; Chronic deep vein thrombosis (DVT); Vertigo     Visit Date: 01/16/2021 12:17:00  Address: [address] [place] [address]  Phone: [phone]     Emergency Department Providers:         Primary Physician:      WILNETTE DEBBY CARLIN [facility] would like to thank you for allowing us  to assist you with your healthcare needs. The following includes patient education materials and information regarding your injury/illness.     Follow-up Instructions:  You were seen today on an emergency basis. Please contact your primary care doctor for a follow up appointment. If you received a referral to a specialist doctor, it is important you follow-up as instructed.    It is important that you call your follow-up doctor to schedule and confirm the location of your next appointment. Your doctor may practice at multiple locations. The office location of your follow-up appointment may be different to the one written on your discharge instructions.    If you do not have a primary care doctor, please call [phone] for help in finding a [name] Provider. For help in finding a specialist doctor, please call [phone].    If your condition gets worse before your follow-up with your primary care doctor or specialist, please return to the Emergency Department.      Coronavirus 2019 (COVID-19) Reminders:     Patients age 85 - 89, with parental consent, and patients over age 85 can make an appointment for a COVID-19 vaccine. Patients can contact their [facility] doctors' offices to schedule an  appointment to receive the COVID-19 vaccine. Patients who do not have a [facility] physician can call [phone] to schedule vaccination appointments.      Follow Up Appointments:  Primary Care Provider:     Name: FRANKLIN,  CHERI-MD     Phone: [phone]                 With: Address: When:   Follow up with primary care provider  Within 1 week              Post [facility] SERVICES%>          Medications that have not changed  Other Medications  acetaminophen  (Tylenol  Extra Strength 500 mg oral tablet) 2 Tabs Oral (given by mouth) every 4 hours as needed for pain.  Last Dose:____________________  amLODIPine  (amLODIPine  5 mg oral tablet) 1 Tabs Oral (given by mouth) every day.  Last Dose:____________________  apixaban  (apixaban  5 mg oral tablet) Take 2 tablets twice daily until 7/5, then on 7/6 take 1 tablet twice daily. Refills: 5.  Last Dose:____________________  ascorbic acid (Vitamin C 1000 mg oral tablet) 1 Tabs Oral (given by mouth) every day.  Last Dose:____________________  aspirin  (aspirin  81 mg oral delayed release tablet) 1 Tabs  Oral (given by mouth) every day.  Last Dose:____________________  atorvastatin (atorvastatin 40 mg oral tablet) 1 Tabs Oral (given by mouth) every day.  Last Dose:____________________  calcium carbonate (Tums 500 mg oral tablet, chewable) 2 Tabs Chewed every 4 days.  Last Dose:____________________  cholecalciferol (Vitamin D3 5000 intl units oral tablet) 1 Tabs Oral (given by mouth) every day.  Last Dose:____________________  furosemide  (furosemide  20 mg oral tablet) 0.5 - 1 tab Oral (given by mouth) every day as needed prn. for ankle swelling.  Last Dose:____________________  lisinopril  (lisinopril  20 mg oral tablet) 1 Tabs Oral (given by mouth) every day.  Last Dose:____________________  meclizine  (meclizine  25 mg oral tablet) 1 Tabs Oral (given by mouth) 3 times a day as needed as needed for dizziness.  Last Dose:____________________  Misc  Medication (Leg cramp cream) 1 Application Topical (on the skin) every day as needed.  Last Dose:____________________  multivitamin (B-Complex with B-12 oral tablet) 1 Tabs Oral (given by mouth) every day.  Last Dose:____________________  multivitamin with minerals (One-A-Day Women 50 Plus oral tablet) 1 Tabs Oral (given by mouth) every day.  Last Dose:____________________  omega-3 polyunsaturated fatty acids (Omega-3 Fish Oil 1000 mg oral capsule) 1 Capsules Oral (given by mouth) every day.  Last Dose:____________________      Allergy Info: Peaches; Strawberries; Chocolate; shellfish     Discharge Additional Information          Discharge Patient 01/16/21 18:29:00 EDT      Patient Education Materials:        Peripheral Edema      Peripheral edema is swelling that is caused by a buildup of fluid. Peripheral edema most often affects the lower legs, ankles, and feet. It can also develop in the arms, hands, and face. The area of the body that has peripheral edema will look swollen. It may also feel heavy or warm. Your clothes may start to feel tight. Pressing on the area may make a temporary dent in your skin. You may not be able to move your swollen arm or leg as much as usual.    There are many causes of peripheral edema. It can happen because of a complication of other conditions such as congestive heart failure, kidney disease, or a problem with your blood circulation. It also can be a side effect of certain medicines or because of an infection. It often happens to women during pregnancy. Sometimes, the cause is not known.      Follow these instructions at home:    Managing pain, stiffness, and swelling       Raise (elevate) your legs while you are sitting or lying down.     Move around often to prevent stiffness and to lessen swelling.     Do not sit or stand for long periods of time.     Wear support stockings as told by your health care provider.      Medicines     Take over-the-counter and prescription  medicines only as told by your health care provider.     Your health care provider may prescribe medicine to help your body get rid of excess water (diuretic).      General instructions     Pay attention to any changes in your symptoms.     Follow instructions from your health care provider about limiting salt (sodium) in your diet. Sometimes, eating less salt may reduce swelling.     Moisturize skin daily to help prevent skin from cracking and  draining.     Keep all follow-up visits as told by your health care provider. This is important.        Contact a health care provider if you have:     A fever.     Edema that starts suddenly or is getting worse, especially if you are pregnant or have a medical condition.     Swelling in only one leg.     Increased swelling, redness, or pain in one or both of your legs.     Drainage or sores at the area where you have edema.      Get help right away if you:     Develop shortness of breath, especially when you are lying down.     Have pain in your chest or abdomen.     Feel weak.     Feel faint.      Summary     Peripheral edema is swelling that is caused by a buildup of fluid. Peripheral edema most often affects the lower legs, ankles, and feet.     Move around often to prevent stiffness and to lessen swelling. Do not sit or stand for long periods of time.     Pay attention to any changes in your symptoms.     Contact a health care provider if you have edema that starts suddenly or is getting worse, especially if you are pregnant or have a medical condition.     Get help right away if you develop shortness of breath, especially when lying down.      This information is not intended to replace advice given to you by your health care provider. Make sure you discuss any questions you have with your health care provider.      Document Revised: 12/23/2017 Document Reviewed: 12/23/2017  Elsevier Patient Education ? 2021 Elsevier Inc.       ---------------------------------------------------------------------------------------------------------------------  [facility] allows patients to review your COVID and other test results as well as discharge documents from any [name] [address], Emergency Department, surgical center or outpatient lab. Test results are typically available 36 hours after the test is completed.     [name] encourages you to self-enroll in the [facility] Patient Portal.     To begin your self-enrollment process, please visit [email or web address] Under [facility], click on "Sign up now".     NOTE: You must be 16 years and older to use [facility] Self-Enroll online. If you are a parent, caregiver, or guardian; you need an invite to access your child's or dependent's health records. To obtain an invite, contact the Medical Records department at [phone] Monday through Friday, 8-4:30, select option 3 . If we receive your call afterhours, we will return your call the next business day.     If you have issues trying to create or access your account, contact [employer] support at [phone] available 7 days a week 24 hours a day.     Comment:

## 2021-01-16 NOTE — Discharge Summary (Signed)
 ED Clinical Summary                     [facility]  [address]  [place], [place], [address]  [phone]          PERSON INFORMATION  Name: Allison [name], Allison [name] Age:  87 Years DOB: Aug 15, 1928   Sex: Female Language: English PCP: JOHNIE FARROW   Marital Status: Widowed Phone: [phone] Med Service: JENENE DELLIE Sabot   MRN: [id number] Acct# [id number] Arrival: 01/16/2021 12:17:00   Visit Reason: Leg pain-swelling; LEG SWELLING Acuity: 3 LOS: 000 07:32   Address:    [address] [place] [address]   Diagnosis:    Abdominal pain; Chronic deep vein thrombosis (DVT); Vertigo  Medications:          Medications that have not changed  Other Medications  acetaminophen  (Tylenol  Extra Strength 500 mg oral tablet) 2 Tabs Oral (given by mouth) every 4 hours as needed for pain.  Last Dose:____________________  amLODIPine  (amLODIPine  5 mg oral tablet) 1 Tabs Oral (given by mouth) every day.  Last Dose:____________________  apixaban  (apixaban  5 mg oral tablet) Take 2 tablets twice daily until 7/5, then on 7/6 take 1 tablet twice daily. Refills: 5.  Last Dose:____________________  ascorbic acid (Vitamin C 1000 mg oral tablet) 1 Tabs Oral (given by mouth) every day.  Last Dose:____________________  aspirin  (aspirin  81 mg oral delayed release tablet) 1 Tabs Oral (given by mouth) every day.  Last Dose:____________________  atorvastatin (atorvastatin 40 mg oral tablet) 1 Tabs Oral (given by mouth) every day.  Last Dose:____________________  calcium carbonate (Tums 500 mg oral tablet, chewable) 2 Tabs Chewed every 4 days.  Last Dose:____________________  cholecalciferol (Vitamin D3 5000 intl units oral tablet) 1 Tabs Oral (given by mouth) every day.  Last Dose:____________________  furosemide  (furosemide  20 mg oral tablet) 0.5 - 1 tab Oral (given by mouth) every day as needed prn. for ankle swelling.  Last Dose:____________________  lisinopril  (lisinopril  20 mg oral tablet) 1 Tabs Oral  (given by mouth) every day.  Last Dose:____________________  meclizine  (meclizine  25 mg oral tablet) 1 Tabs Oral (given by mouth) 3 times a day as needed as needed for dizziness.  Last Dose:____________________  Misc Medication (Leg cramp cream) 1 Application Topical (on the skin) every day as needed.  Last Dose:____________________  multivitamin (B-Complex with B-12 oral tablet) 1 Tabs Oral (given by mouth) every day.  Last Dose:____________________  multivitamin with minerals (One-A-Day Women 50 Plus oral tablet) 1 Tabs Oral (given by mouth) every day.  Last Dose:____________________  omega-3 polyunsaturated fatty acids (Omega-3 Fish Oil 1000 mg oral capsule) 1 Capsules Oral (given by mouth) every day.  Last Dose:____________________      Medications Administered During Visit:                Medication Dose Route   iopamidol  100 mL IV Contrast   Sodium Chloride  0.9% 1000 mL IV Piggyback   amlodipine  5 mg Oral               Allergies      Peaches      Chocolate      shellfish (facial swelling)      Strawberries (Unknown)      Major Tests and Procedures:  The following procedures and tests were performed during your ED visit.  COMMON PROCEDURES%>  COMMON PROCEDURES COMMENTS%>  PROVIDER INFORMATION               Provider Role Assigned Sampson Shona Guerin [facility] ED Nurse 01/16/2021 15:46:24 01/16/2021 18:58:14   DITTRICH, DEBBY SELTZER ED Provider 01/16/2021 15:46:55        Attending Physician:  WILNETTE DEBBY CHARLES-MD      Admit Doc  DITTRICH,  THOMAS CHARLES-MD     Consulting Doc       VITALS INFORMATION  Vital Sign Triage Latest   Temp Oral ORAL_1%> ORAL%>   Temp Temporal TEMPORAL_1%> TEMPORAL%>   Temp Intravascular INTRAVASCULAR_1%> INTRAVASCULAR%>   Temp Axillary AXILLARY_1%> AXILLARY%>   Temp Rectal RECTAL_1%> RECTAL%>   02 Sat 100 % 100 %   Respiratory Rate RATE_1%> RATE%>   Peripheral Pulse Rate PULSE RATE_1%> PULSE RATE%>   Apical Heart Rate HEART RATE_1%> HEART RATE%>   Blood  Pressure BLOOD PRESSURE_1%>/ BLOOD PRESSURE_1%>92 mmHg BLOOD PRESSURE%> / BLOOD PRESSURE%>104 mmHg                 Immunizations      No Immunizations Documented This Visit          DISCHARGE INFORMATION   Discharge Disposition: H Outpt-Sent Home   Discharge Location:  Home   Discharge Date and Time:  01/16/2021 19:49:43   ED Checkout Date and Time:  01/16/2021 19:49:43     DEPART REASON INCOMPLETE INFORMATION               Depart Action Incomplete Reason   Interactive View/I&O Recently assessed   Patient Understanding Recently assessed               Problems      Active           No contraindication to deep vein thrombosis (DVT) prophylaxis          HTN (hypertension)              Smoking Status      Never smoker         PATIENT EDUCATION INFORMATION  Instructions:     Peripheral Edema     Follow up:                   With: Address: When:   Follow up with primary care provider  Within 1 week              ED PROVIDER DOCUMENTATION     Patient:   Allison, [name]            MRN: [id number]            FIN: [id number]               Age:   87 years     Sex:  Female     DOB:  06/11/1928   Associated Diagnoses:   Chronic deep vein thrombosis (DVT); Abdominal pain; Vertigo   Author:   WILNETTE DEBBY CHARLES-MD      Basic Information   Time seen: Provider Seen (ST)   ED Provider/Time:    DITTRICH,  THOMAS CHARLES-MD / 01/16/2021 15:46  .   Additional information: Chief Complaint from Nursing Triage Note   Chief Complaint  Chief Complaint: pt reports leg swelling in bilateral legs. hahad 3 PE'S and one DVT in right leg 3 months ago. pt takes eliquis . pt also reports vertigo. (01/16/21 12:19:00).      History of Present Illness   The patient presents with abdominal  pain.  The onset was just prior to arrival.  The course/duration of symptoms is constant.  The character of symptoms is achy.  The degree at onset was moderate.  The Location of pain at onset was right, upper and abdominal.  The degree at present is moderate.  The  Location of pain at present is right, upper and abdominal.  Radiating pain: none. The exacerbating factor is none.  The relieving factor is none.  Therapy today: none.  Risk factors consist of none.  Associated symptoms: denies chest pain, denies nausea, denies vomiting, denies diarrhea, denies back pain and denies shortness of breath.  87yo female with multiple complaints. She reports right sided abdominal pain, dizziness and lightheadedness, and RLE swelling. She has a history of DVT/PE and is compliant with eliquis . .        Review of Systems   Constitutional symptoms:  Negative except as documented in HPI.   Skin symptoms:  Negative except as documented in HPI.   Eye symptoms:  Negative except as documented in HPI.   Respiratory symptoms:  Negative except as documented in HPI.   Cardiovascular symptoms:  Negative except as documented in HPI.   Gastrointestinal symptoms:  Negative except as documented in HPI.   Genitourinary symptoms:  Negative except as documented in HPI.   Musculoskeletal symptoms:  Negative except as documented in HPI.   Neurologic symptoms:  Negative except as documented in HPI.   Psychiatric symptoms:  Negative except as documented in HPI.             Additional review of systems information: All other systems reviewed and otherwise negative.      Health Status   Allergies:    Allergic Reactions (Selected)  Severe  Shellfish- Facial swelling.  Moderate  Chocolate- No reactions were documented.  Peaches- No reactions were documented.  Strawberries- Unknown..   Medications:  (Selected)   Inpatient Medications  Ordered  Isovue -300: 100 mL, IV Contrast, On Call  Sodium Chloride  0.9% bolus: 1,000 mL, 2000 mL/hr, IV Piggyback, Once  Prescriptions  Prescribed  apixaban  5 mg oral tablet: See Instructions, Take 2 tablets twice daily until 7/5, then on 7/6 take 1 tablet twice daily, 120 tabs, 5 Refill(s)  Documented Medications  Documented  B-Complex with B-12 oral tablet: 1 tabs, Oral, Daily, 0  Refill(s)  Leg cramp cream: 1 app, Topical, Daily, PRN, 0 Refill(s)  Omega-3 Fish Oil 1000 mg oral capsule: 1,000 mg, 1 caps, Oral, Daily, 0 Refill(s)  One-A-Day Women 50 Plus oral tablet: 1 tabs, Oral, Daily, 0 Refill(s)  Tums 500 mg oral tablet, chewable: 1,000 mg, 2 tabs, Chewed, q4day, 0 Refill(s)  Tylenol  Extra Strength 500 mg oral tablet: 1,000 mg, 2 tabs, Oral, q4hr, PRN: for pain, 0 Refill(s)  Vitamin C 1000 mg oral tablet: 1,000 mg, 1 tabs, Oral, Daily, 0 Refill(s)  Vitamin D3 5000 intl units oral tablet: 125 mcg, 1 tabs, Oral, Daily, 0 Refill(s)  amLODIPine  5 mg oral tablet: 5 mg, 1 tabs, Oral, Daily, 0 Refill(s)  aspirin  81 mg oral delayed release tablet: 81 mg, 1 tabs, Oral, Daily, 0 Refill(s)  atorvastatin 40 mg oral tablet: 40 mg, 1 tabs, Oral, Daily, 0 Refill(s)  furosemide  20 mg oral tablet: 0.5 - 1 tab, Oral, Daily, for ankle swelling, PRN: prn, 0 Refill(s)  lisinopril  20 mg oral tablet: 20 mg, 1 tabs, Oral, Daily, 0 Refill(s)  meclizine  25 mg oral tablet: 25 mg, 1 tabs, Oral, TID, PRN: as needed for dizziness, 0  Refill(s).      Past Medical/ Family/ Social History   Medical history: Reviewed as documented in chart.   Surgical history: Reviewed as documented in chart.   Family history: Not significant.   Social history: Reviewed as documented in chart.   Problem list:    Active Problems (5)  At risk for falls   HLD (hyperlipidemia)   HTN (hypertension)   No contraindication to deep vein thrombosis (DVT) prophylaxis   Vertigo   .      Physical Examination               Vital Signs   Vital Signs   01/16/2021 12:19 EDT Systolic Blood Pressure 198 mmHg  >HHI    Diastolic Blood Pressure 92 mmHg  HI    Temperature Oral 37 degC    Heart Rate Monitored 61 bpm    Respiratory Rate 18 br/min    SpO2 100 %   .   Measurements   01/16/2021 12:24 EDT Body Mass Index est meas 20.22 kg/m2   01/16/2021 12:24 EDT Body Mass Index Measured 20.22 kg/m2   01/16/2021 12:19 EDT Height/Length Measured 167 cm    Weight Dosing  56.4 kg   .   Basic Oxygen Information   01/16/2021 12:19 EDT Oxygen Therapy Room air    SpO2 100 %   .   General:  Alert, no acute distress.    Skin:  Warm, dry, pink.    Head:  Normocephalic.   Neck:  Supple, trachea midline.    Eye:  Pupils are equal, round and reactive to light, extraocular movements are intact.    Ears, nose, mouth and throat:  Oral mucosa moist.   Cardiovascular:  Regular rate and rhythm.   Respiratory:  Lungs are clear to auscultation, respirations are non-labored.    Back:  Normal range of motion.   Musculoskeletal:  Normal ROM, no tenderness.    Chest wall   Gastrointestinal:  Soft, Nontender, Non distended, Normal bowel sounds, No organomegaly.    Neurological:  Alert and oriented to person, place, time, and situation, No focal neurological deficit observed, normal sensory observed, normal motor observed.    Lymphatics:  No lymphadenopathy.   Psychiatric:  Cooperative, appropriate mood & affect.       Medical Decision Making   Rationale:  87yo female with multiple complaints: abdominal pain, dizziness, and right leg swelling. Plan for labs, UA, CT, and DVT RLE.    Labs normal. UA clean. CT negative aside from fibroids. DVT study shows chronic DVT. Patient already taking eliquis .    Plan for DC home with f/u with PMD. .   Results review:  All Results   01/16/2021 17:20 EDT CT Abdomen Pelvis w/ Contrast FINAL REPORT    01/16/2021 17:02 EDT ED Triage Note     01/16/2021 16:22 EDT ED Note-Physician Abdominal Pain *ED (In Progress)   01/16/2021 15:23 EDT VL-DVT Study LE Bilateral 0800.012A.pdf [place])   01/16/2021 18:10 EDT Oxygen Therapy Room air     Systolic Blood Pressure 216 mmHg  >HHI     Diastolic Blood Pressure 109 mmHg  >HHI     Heart Rate Monitored 62 bpm     Respiratory Rate 16 br/min     SpO2 97 %    01/16/2021 18:06 EDT ED RN Progress Note per MD ok for pt to take home eliquis  dose.     Patient Status Rounding Oral fluids given    01/16/2021 17:59 EDT UA Color Light Yellow  UA Appear  Clear     UA Glucose Negative     UA Bili Negative     UA Ketones Negative     UA Spec Grav 1.010     UA Blood Small     UA pH 7.0     Protein U Negative     UA Urobilinogen 0.2 EU/dL     UA Nitrite Negative     UA Leuk Est Negative     WBC U 0-2 /HPF     RBC U Not Seen /HPF     Sq Epi U Rare /LPF     Bacteria U Rare     Mucus U Not Seen /LPF     Amorph U Not Seen /HPF    01/16/2021 17:28 EDT Estimated Creatinine Clearance 41.61 mL/min    01/16/2021 17:05 EDT Patient Status Rounding Patient ID checked, Denies any needs, Family at bedside    01/16/2021 17:03 EDT Environmental Safety Implemented Bed in low position, Wheels locked, Call device within reach, Personal items within reach, Patient specific safety measures     Skin Color General Usual for ethnicity     Skin Temperature Warm     Skin Moisture General Dry     Leg Right      Musculoskeletal Abnormality: Enlarged/swelling     Musculoskeletal Symptoms: Pain at rest     Neurological Symptoms Dizziness     Level of Consciousness - APVU Alert     Patient Status Rounding Patient ID checked, Denies any needs, Family at bedside     Affect/Behavior Appropriate     Orientation Assessment Oriented x 4     Respiratory Symptoms None     Respirations Unlabored     Respiratory Pattern Regular     Patient Airway Status Patent without support     Oxygen Therapy Room air    01/16/2021 17:02 EDT Advanced Directives Yes     Does the Pt have a Life Limiting Illness None of the above     Hx of falling last 3 months ED Fall No     Patient confused of disoriented ED Fall No     Patient intoxicated or sedated ED Fall No     Patient have an impaired gait ED Fall Yes     Use a mobility assistance device ED Fall Yes     Patient altered elimination ED Fall No     [facility] ED Fall Score 2     Barriers to Learning None evident    01/16/2021 16:58 EDT WBC 5.8 x10e3/mcL     RBC 4.53 x10e6/mcL     Hgb 12.9 g/dL     HCT 63.0 %     MCV 81.5 fL     MCH 28.5 pg     MCHC 35.0 g/dL     RDW 86.7 %      Platelet 174 x10e3/mcL     MPV 10.7 fL     NRBC Absolute Auto 0.000 x10e3/mcL     NRBC Percent Auto 0.0 %     Sodium Lvl 141 mmol/L     Potassium Lvl 4.0 mmol/L     Chloride 102 mmol/L     CO2 29 mmol/L     Glucose Random 88 mg/dL     BUN 12 mg/dL     Creatinine Lvl 0.8 mg/dL     AGAP 10 mmol/L     Osmolality Calc 280 mOsm/kg     Calcium Lvl 9.0 mg/dL  Protein Total 6.8 g/dL     Albumin Lvl 4.0 g/dL     Globulin Calc 2.8 g/dL     AG Ratio Calc 8.56     Alk Phos 58 unit/L     AST 20 unit/L     ALT 11 unit/L     eGFR 70 mL/min/1.73m  LOW     Bili Total 0.30 mg/dL     Lipase Lvl 12 unit/L  LOW     Arm, Upper Left Ultrasound guided over the needle 18 gauge      Peripheral IV Line Status: Flushes easily, Good blood return     Peripheral IV Line Care: Aspirated and flushed     Peripheral IV Site Condition: No complications     Peripheral IV Dressing: Transparent     Peripheral IV Dressing Condition: Dry, Intact     Peripheral IV Activity: First assessment new site     Peripheral IV Number of Attempts: 1    01/16/2021 12:19 EDT ED Triage Note     01/16/2021 12:24 EDT Body Mass Index est meas 20.22 kg/m2    01/16/2021 12:24 EDT Estimated Creatinine Clearance 41.61 mL/min     Body Mass Index Measured 20.22 kg/m2    01/16/2021 12:19 EDT ED Behavioral Activity Rating Scale 4 - Quiet and awake (normal level of activity)     Pregnancy Status N/A     SIRS Possible Source of Infectionn No     SIRS Source of Infection None of the above     Last 3 mo, thoughts killing self/others Patient denies     Suspect or Concern for: None     Feels Safe Where Live Yes     Oxygen Therapy Room air     Recent Travel History ID No recent travel     Close Contact with COVID-19  ID No     TB Symptom Screen ID No symptoms     C. diff Symptom/History ID Neither of the above     Last 14 days COVID-19 ID No     Last 90 days COVID-19 ID No     Height/Length Measured 167 cm     Weight Dosing 56.4 kg     Numeric Rating Pain Scale 8     Systolic Blood  Pressure 198 mmHg  >HHI     Diastolic Blood Pressure 92 mmHg  HI     Temperature Oral 37 degC     Heart Rate Monitored 61 bpm     Respiratory Rate 18 br/min     SpO2 100 %    .   Radiology results:  Rad Results (ST)   CT Abdomen Pelvis w/ Contrast  ?  01/16/21 17:30:32  CT Abdomen Pelvis w/ Contrast 01/16/21    COMPARISON: None    INDICATION: Abdominal pain, acute, nonlocalized ;    Technique: Axial images were performed through the abdomen with IV contrast.  Axial images were performed through the pelvis with IV contrast    CT scanning was performed using radiation dose reduction technique when  appropriate, per system protocol    FINDINGS:    Lung bases: Minor bronchiectasis posterior medial right lung base and some minor  atelectasis lingula.  Liver: Normal  Spleen: Normal  Pancreas: Normal  Bilateral adrenal glands: Normal  Kidneys: Normal  Gallbladder: Unremarkable  Bowel: Examination of the bowel demonstrates no evidence of abscess or  inflammatory change. No evidence of obstruction.  No mass or adenopathy.  Enlarged leiomyomatous uterus.  Bladder:  Unremarkable  Regional skeleton: Diffuse osteopenia. Chronic appearing wedge compression  fractures L1 and T12. Degenerative changes.      IMPRESSION: Negative CT scan of the abdomen and pelvis except for enlarged  leiomyomatous uterus.    IMPRESSION:  ?  Signed By: GOUGH, CATHERINE JOHNSON-MD  .      Reexamination/ Reevaluation   Vital signs   Basic Oxygen Information   01/16/2021 12:19 EDT Oxygen Therapy Room air    SpO2 100 %         Impression and Plan   Diagnosis   Chronic deep vein thrombosis (DVT) (ICD10-CM I82.509, Discharge, Medical)   Abdominal pain (ICD10-CM R10.9, Discharge, Medical)   Vertigo (ICD10-CM R42, Discharge, Medical)   Plan   Condition: Stable.    Disposition: Discharged: to home.    Patient was given the following educational materials: Peripheral Edema, Peripheral Edema.    Limitations: Limited activity.    Follow up with: Follow up with  primary care provider Within 1 week, Return to Emergency Department, In: as needed.    Counseled: Patient, Family.

## 2021-01-16 NOTE — ED Provider Notes (Signed)
Abdominal Pain *ED        Patient:   Allison [name], Allison [name]            MRN: [id number]            FIN: [id number]               Age:   87 years     Sex:  Female     DOB:  [birth year]   Associated Diagnoses:   Chronic deep vein thrombosis (DVT); Abdominal pain; Vertigo   Author:   Wilson Singer CHARLES-MD      Basic Information   Time seen: Provider Seen (ST)   ED Provider/Time:    Jaysen Wey,  Iokepa Geffre CHARLES-MD / 01/16/2021 15:46  .   Additional information: Chief Complaint from Nursing Triage Note   Chief Complaint  Chief Complaint: pt reports leg swelling in bilateral legs. hahad 3 PE'S and one DVT in right leg 3 months ago. pt takes eliquis. pt also reports vertigo. (01/16/21 12:19:00).      History of Present Illness   The patient presents with abdominal pain.  The onset was just prior to arrival.  The course/duration of symptoms is constant.  The character of symptoms is achy.  The degree at onset was moderate.  The Location of pain at onset was right, upper and abdominal.  The degree at present is moderate.  The Location of pain at present is right, upper and abdominal.  Radiating pain: none. The exacerbating factor is none.  The relieving factor is none.  Therapy today: none.  Risk factors consist of none.  Associated symptoms: denies chest pain, denies nausea, denies vomiting, denies diarrhea, denies back pain and denies shortness of breath.  87yo female with multiple complaints. She reports right sided abdominal pain, dizziness and lightheadedness, and RLE swelling. She has a history of DVT/PE and is compliant with eliquis. .        Review of Systems   Constitutional symptoms:  Negative except as documented in HPI.   Skin symptoms:  Negative except as documented in HPI.   Eye symptoms:  Negative except as documented in HPI.   Respiratory symptoms:  Negative except as documented in HPI.   Cardiovascular symptoms:  Negative except as documented in HPI.   Gastrointestinal symptoms:  Negative except as documented in  HPI.   Genitourinary symptoms:  Negative except as documented in HPI.   Musculoskeletal symptoms:  Negative except as documented in HPI.   Neurologic symptoms:  Negative except as documented in HPI.   Psychiatric symptoms:  Negative except as documented in HPI.             Additional review of systems information: All other systems reviewed and otherwise negative.      Health Status   Allergies:    Allergic Reactions (Selected)  Severe  Shellfish- Facial swelling.  Moderate  Chocolate- No reactions were documented.  Peaches- No reactions were documented.  Strawberries- Unknown..   Medications:  (Selected)   Inpatient Medications  Ordered  Isovue-300: 100 mL, IV Contrast, On Call  Sodium Chloride 0.9% bolus: 1,000 mL, 2000 mL/hr, IV Piggyback, Once  Prescriptions  Prescribed  apixaban 5 mg oral tablet: See Instructions, Take 2 tablets twice daily until 7/5, then on 7/6 take 1 tablet twice daily, 120 tabs, 5 Refill(s)  Documented Medications  Documented  B-Complex with B-12 oral tablet: 1 tabs, Oral, Daily, 0 Refill(s)  Leg cramp cream: 1 app, Topical, Daily, PRN, 0 Refill(s)  Omega-3 Fish Oil 1000 mg oral capsule: 1,000 mg, 1 caps, Oral, Daily, 0 Refill(s)  One-A-Day Women 50 Plus oral tablet: 1 tabs, Oral, Daily, 0 Refill(s)  Tums 500 mg oral tablet, chewable: 1,000 mg, 2 tabs, Chewed, q4day, 0 Refill(s)  Tylenol Extra Strength 500 mg oral tablet: 1,000 mg, 2 tabs, Oral, q4hr, PRN: for pain, 0 Refill(s)  Vitamin C 1000 mg oral tablet: 1,000 mg, 1 tabs, Oral, Daily, 0 Refill(s)  Vitamin D3 5000 intl units oral tablet: 125 mcg, 1 tabs, Oral, Daily, 0 Refill(s)  amLODIPine 5 mg oral tablet: 5 mg, 1 tabs, Oral, Daily, 0 Refill(s)  aspirin 81 mg oral delayed release tablet: 81 mg, 1 tabs, Oral, Daily, 0 Refill(s)  atorvastatin 40 mg oral tablet: 40 mg, 1 tabs, Oral, Daily, 0 Refill(s)  furosemide 20 mg oral tablet: 0.5 - 1 tab, Oral, Daily, for ankle swelling, PRN: prn, 0 Refill(s)  lisinopril 20 mg oral tablet: 20 mg, 1  tabs, Oral, Daily, 0 Refill(s)  meclizine 25 mg oral tablet: 25 mg, 1 tabs, Oral, TID, PRN: as needed for dizziness, 0 Refill(s).      Past Medical/ Family/ Social History   Medical history: Reviewed as documented in chart.   Surgical history: Reviewed as documented in chart.   Family history: Not significant.   Social history: Reviewed as documented in chart.   Problem list:    Active Problems (5)  At risk for falls   HLD (hyperlipidemia)   HTN (hypertension)   No contraindication to deep vein thrombosis (DVT) prophylaxis   Vertigo   .      Physical Examination               Vital Signs   Vital Signs   66/07/4032 74:25 EDT Systolic Blood Pressure 956 mmHg  >HHI    Diastolic Blood Pressure 92 mmHg  HI    Temperature Oral 37 degC    Heart Rate Monitored 61 bpm    Respiratory Rate 18 br/min    SpO2 100 %   .   Measurements   01/16/2021 12:24 EDT Body Mass Index est meas 20.22 kg/m2   01/16/2021 12:24 EDT Body Mass Index Measured 20.22 kg/m2   01/16/2021 12:19 EDT Height/Length Measured 167 cm    Weight Dosing 56.4 kg   .   Basic Oxygen Information   01/16/2021 12:19 EDT Oxygen Therapy Room air    SpO2 100 %   .   General:  Alert, no acute distress.    Skin:  Warm, dry, pink.    Head:  Normocephalic.   Neck:  Supple, trachea midline.    Eye:  Pupils are equal, round and reactive to light, extraocular movements are intact.    Ears, nose, mouth and throat:  Oral mucosa moist.   Cardiovascular:  Regular rate and rhythm.   Respiratory:  Lungs are clear to auscultation, respirations are non-labored.    Back:  Normal range of motion.   Musculoskeletal:  Normal ROM, no tenderness.    Chest wall   Gastrointestinal:  Soft, Nontender, Non distended, Normal bowel sounds, No organomegaly.    Neurological:  Alert and oriented to person, place, time, and situation, No focal neurological deficit observed, normal sensory observed, normal motor observed.    Lymphatics:  No lymphadenopathy.   Psychiatric:  Cooperative, appropriate mood &  affect.       Medical Decision Making   Rationale:  87yo female with multiple complaints: abdominal pain, dizziness, and right leg swelling.  Plan for labs, UA, CT, and DVT RLE.    Labs normal. UA clean. CT negative aside from fibroids. DVT study shows chronic DVT. Patient already taking eliquis.    Plan for DC home with f/u with PMD. .   Results review:  All Results   01/16/2021 17:20 EDT CT Abdomen Pelvis w/ Contrast FINAL REPORT    01/16/2021 17:02 EDT ED Triage Note     01/16/2021 16:22 EDT ED Note-Physician Abdominal Pain *ED (In Progress)   01/16/2021 15:23 EDT VL-DVT Study LE Bilateral 0800.012A.pdf Alfredia Ferguson)   01/16/2021 18:10 EDT Oxygen Therapy Room air     Systolic Blood Pressure 629 mmHg  >HHI     Diastolic Blood Pressure 528 mmHg  >HHI     Heart Rate Monitored 62 bpm     Respiratory Rate 16 br/min     SpO2 97 %    01/16/2021 18:06 EDT ED RN Progress Note per MD ok for pt to take home eliquis dose.     Patient Status Rounding Oral fluids given    01/16/2021 17:59 EDT UA Color Light Yellow     UA Appear Clear     UA Glucose Negative     UA Bili Negative     UA Ketones Negative     UA Spec Grav 1.010     UA Blood Small     UA pH 7.0     Protein U Negative     UA Urobilinogen 0.2 EU/dL     UA Nitrite Negative     UA Leuk Est Negative     WBC U 0-2 /HPF     RBC U Not Seen /HPF     Sq Epi U Rare /LPF     Bacteria U Rare     Mucus U Not Seen /LPF     Amorph U Not Seen /HPF    01/16/2021 17:28 EDT Estimated Creatinine Clearance 41.61 mL/min    01/16/2021 17:05 EDT Patient Status Rounding Patient ID checked, Denies any needs, Family at bedside    01/16/2021 17:03 EDT Environmental Safety Implemented Bed in low position, Wheels locked, Call device within reach, Personal items within reach, Patient specific safety measures     Skin Color General Usual for ethnicity     Skin Temperature Warm     Skin Moisture General Dry     Leg Right      Musculoskeletal Abnormality: Enlarged/swelling     Musculoskeletal Symptoms: Pain at rest      Neurological Symptoms Dizziness     Level of Consciousness - APVU Alert     Patient Status Rounding Patient ID checked, Denies any needs, Family at bedside     Affect/Behavior Appropriate     Orientation Assessment Oriented x 4     Respiratory Symptoms None     Respirations Unlabored     Respiratory Pattern Regular     Patient Airway Status Patent without support     Oxygen Therapy Room air    01/16/2021 17:02 EDT Advanced Directives Yes     Does the Pt have a Life Limiting Illness None of the above     Hx of falling last 3 months ED Fall No     Patient confused of disoriented ED Fall No     Patient intoxicated or sedated ED Fall No     Patient have an impaired gait ED Fall Yes     Use a mobility assistance device ED Fall Yes     Patient  altered elimination ED Fall No     [facility] ED Fall Score 2     Barriers to Learning None evident    01/16/2021 16:58 EDT WBC 5.8 x10e3/mcL     RBC 4.53 x10e6/mcL     Hgb 12.9 g/dL     HCT 36.9 %     MCV 81.5 fL     MCH 28.5 pg     MCHC 35.0 g/dL     RDW 13.2 %     Platelet 174 x10e3/mcL     MPV 10.7 fL     NRBC Absolute Auto 0.000 x10e3/mcL     NRBC Percent Auto 0.0 %     Sodium Lvl 141 mmol/L     Potassium Lvl 4.0 mmol/L     Chloride 102 mmol/L     CO2 29 mmol/L     Glucose Random 88 mg/dL     BUN 12 mg/dL     Creatinine Lvl 0.8 mg/dL     AGAP 10 mmol/L     Osmolality Calc 280 mOsm/kg     Calcium Lvl 9.0 mg/dL     Protein Total 6.8 g/dL     Albumin Lvl 4.0 g/dL     Globulin Calc 2.8 g/dL     AG Ratio Calc 1.43     Alk Phos 58 unit/L     AST 20 unit/L     ALT 11 unit/L     eGFR 70 mL/min/1.73m???  LOW     Bili Total 0.30 mg/dL     Lipase Lvl 12 unit/L  LOW     Arm, Upper Left Ultrasound guided over the needle 18 gauge      Peripheral IV Line Status: Flushes easily, Good blood return     Peripheral IV Line Care: Aspirated and flushed     Peripheral IV Site Condition: No complications     Peripheral IV Dressing: Transparent     Peripheral IV Dressing Condition: Dry, Intact      Peripheral IV Activity: First assessment new site     Peripheral IV Number of Attempts: 1    01/16/2021 12:19 EDT ED Triage Note     01/16/2021 12:24 EDT Body Mass Index est meas 20.22 kg/m2    01/16/2021 12:24 EDT Estimated Creatinine Clearance 41.61 mL/min     Body Mass Index Measured 20.22 kg/m2    01/16/2021 12:19 EDT ED Behavioral Activity Rating Scale 4 - Quiet and awake (normal level of activity)     Pregnancy Status N/A     SIRS Possible Source of Infectionn No     SIRS Source of Infection None of the above     Last 3 mo, thoughts killing self/others Patient denies     Suspect or Concern for: None     Feels Safe Where Live Yes     Oxygen Therapy Room air     Recent Travel History ID No recent travel     Close Contact with COVID-19  ID No     TB Symptom Screen ID No symptoms     C. diff Symptom/History ID Neither of the above     Last 14 days COVID-19 ID No     Last 90 days COVID-19 ID No     Height/Length Measured 167 cm     Weight Dosing 56.4 kg     Numeric Rating Pain Scale 8     Systolic Blood Pressure 1329mmHg  >HHI     Diastolic Blood Pressure 92 mmHg  HI     Temperature Oral 37 degC     Heart Rate Monitored 61 bpm     Respiratory Rate 18 br/min     SpO2 100 %    .   Radiology results:  Rad Results (ST)   CT Abdomen Pelvis w/ Contrast  ?  01/16/21 17:30:32  CT Abdomen Pelvis w/ Contrast 01/16/21    COMPARISON: None    INDICATION: Abdominal pain, acute, nonlocalized ;    Technique: Axial images were performed through the abdomen with IV contrast.  Axial images were performed through the pelvis with IV contrast    CT scanning was performed using radiation dose reduction technique when  appropriate, per system protocol    FINDINGS:    Lung bases: Minor bronchiectasis posterior medial right lung base and some minor  atelectasis lingula.  Liver: Normal  Spleen: Normal  Pancreas: Normal  Bilateral adrenal glands: Normal  Kidneys: Normal  Gallbladder: Unremarkable  Bowel: Examination of the bowel demonstrates no  evidence of abscess or  inflammatory change. No evidence of obstruction.  No mass or adenopathy.  Enlarged leiomyomatous uterus.  Bladder: Unremarkable  Regional skeleton: Diffuse osteopenia. Chronic appearing wedge compression  fractures L1 and T12. Degenerative changes.      IMPRESSION: Negative CT scan of the abdomen and pelvis except for enlarged  leiomyomatous uterus.    IMPRESSION:  ?  Signed By: Gildardo Griffes  .      Reexamination/ Reevaluation   Vital signs   Basic Oxygen Information   01/16/2021 12:19 EDT Oxygen Therapy Room air    SpO2 100 %         Impression and Plan   Diagnosis   Chronic deep vein thrombosis (DVT) (ICD10-CM I82.509, Discharge, Medical)   Abdominal pain (ICD10-CM R10.9, Discharge, Medical)   Vertigo (ICD10-CM R42, Discharge, Medical)   Plan   Condition: Stable.    Disposition: Discharged: to home.    Patient was given the following educational materials: Peripheral Edema, Peripheral Edema.    Limitations: Limited activity.    Follow up with: Follow up with primary care provider Within 1 week, Return to Emergency Department, In: as needed.    Counseled: Patient, Family.      Signature Line     Electronically Signed on 01/16/2021 06:29 PM EDT   ________________________________________________   Mickel Duhamel, MD, Amada Kingfisher by: Mickel Duhamel, MD, Marijo Conception on 01/16/2021 06:29 PM EDT

## 2021-01-16 NOTE — ED Notes (Signed)
ED Triage Note       ED Triage Adult Entered On:  01/16/2021 12:24 EDT    Performed On:  01/16/2021 12:19 EDT by Allyson Sabal D-RN               Triage   Numeric Rating Pain Scale :   8   Chief Complaint :   pt reports leg swelling in bilateral legs. hahad 3 PE'S and one DVT in right leg 3 months ago. pt takes eliquis. pt also reports vertigo.    Chief Complaint Onset :   01/16/2021 12:21 EDT   Virgina Jock Mode of Arrival :   Private vehicle   Infectious Disease Documentation :   Document assessment   Temperature Oral :   37 degC(Converted to: 98.6 degF)    Heart Rate Monitored :   61 bpm   Respiratory Rate :   18 br/min   Systolic Blood Pressure :   198 mmHg (>HHI)    Diastolic Blood Pressure :   92 mmHg (HI)    SpO2 :   100 %   Oxygen Therapy :   Room air   Patient presentation :   None of the above   Chief Complaint or Presentation suggest infection :   No   Dosing Weight Obtained By :   Patient stated   Weight Dosing :   56.4 kg(Converted to: 124 lb 5 oz)    Height :   167 cm(Converted to: 5 ft 6 in)    Body Mass Index Dosing :   20 kg/m2   Allyson Sabal D-RN - 01/16/2021 12:19 EDT   DCP GENERIC CODE   Tracking Acuity :   3   Tracking Group :   ED [id number] Tracking Group   Allyson Sabal D-RN - 01/16/2021 12:19 EDT   ED General Section :   Document assessment   Pregnancy Status :   N/A   ED Allergies Section :   Document assessment   ED Reason for Visit Section :   Document assessment   ED Quick Assessment :   Patient appears awake, alert, oriented to baseline. Skin warm and dry. Moves all extremities. Respiration even and unlabored. Appears in no apparent distress.   Allyson Sabal D-RN - 01/16/2021 12:19 EDT   ID Risk Screen Symptoms   Recent Travel History :   No recent travel   TB Symptom Screen :   No symptoms   Last 90 days COVID-19 ID :   No   Close Contact with COVID-19 ID :   No   Last 14 days COVID-19 ID :   No   C. diff Symptom/History ID :   Neither of the above   Allyson Sabal D-RN - 01/16/2021 12:19 EDT    Allergies   (As Of: 01/16/2021 12:24:00 EDT)   Allergies (Active)   Chocolate  Estimated Onset Date:   Unspecified ; Created By:   Judi Saa RN, KRISTINA A; Reaction Status:   Active ; Category:   Drug ; Substance:   Chocolate ; Type:   Allergy ; Severity:   Moderate ; Updated By:   Judi Saa RN, Anabel Bene; Reviewed Date:   01/16/2021 12:23 EDT      Peaches  Estimated Onset Date:   Unspecified ; Created By:   Judi Saa RN, KRISTINA A; Reaction Status:   Active ; Category:   Drug ; Substance:   Peaches ; Type:   Allergy ; Severity:  Moderate ; Updated By:   Roderic Scarce; Reviewed Date:   01/16/2021 12:23 EDT      shellfish  Estimated Onset Date:   Unspecified ; Reactions:   facial swelling ; Created By:   Jennet Maduro, RN, Ciara; Reaction Status:   Active ; Category:   Drug ; Substance:   shellfish ; Type:   Allergy ; Severity:   Severe ; Updated By:   Jennet Maduro, RN, Ciara; Reviewed Date:   01/16/2021 12:23 EDT      Strawberries  Estimated Onset Date:   Unspecified ; Reactions:   Unknown ; Created By:   Domingo Sep; Reaction Status:   Active ; Category:   Drug ; Substance:   Strawberries ; Type:   Allergy ; Severity:   Moderate ; Updated By:   Domingo Sep; Reviewed Date:   01/16/2021 12:23 EDT        Psycho-Social   Last 3 mo, thoughts killing self/others :   Patient denies   Right click within box for Suspected Abuse policy link. :   None   Feels Safe Where Live :   Yes   ED Behavioral Activity Rating Scale :   4 - Quiet and awake (normal level of activity)   Allyson Sabal D-RN - 01/16/2021 12:19 EDT   ED Reason for Visit   (As Of: 01/16/2021 12:24:00 EDT)   Problems(Active)    At risk for falls (SNOMED CT  :[id number] )  Name of Problem:   At risk for falls ; Onset Date:   06/25/2017 ; Recorder:   SYSTEM,  SYSTEM; Confirmation:   Confirmed ; Classification:   Interdisciplinary ; Code:   [id number] ; Last Updated:   06/25/2017 2:38 EDT ; Life Cycle Date:   06/25/2017 ; Life Cycle Status:   Active ;  Vocabulary:   SNOMED CT   ; Comments:        06/25/2017 2:38 - SYSTEM,  SYSTEM  Problem added by Discern Expert      HLD (hyperlipidemia) (SNOMED CT  :[id number] )  Name of Problem:   HLD (hyperlipidemia) ; Recorder:   BELLEW, RN, KRISTY M; Confirmation:   Confirmed ; Classification:   Patient Stated ; Code:   [id number] ; Contributor System:   PowerChart ; Last Updated:   06/24/2017 16:16 EDT ; Life Cycle Date:   06/24/2017 ; Life Cycle Status:   Active ; Vocabulary:   SNOMED CT        HTN (hypertension) (SNOMED CT  [phone] )  Name of Problem:   HTN (hypertension) ; Recorder:   Daphine Deutscher, RN, Apolinar Junes; Confirmation:   Confirmed ; Classification:   Medical ; Code:   [phone] ; Contributor System:   PowerChart ; Last Updated:   10/28/2015 8:22 EDT ; Life Cycle Date:   10/28/2015 ; Life Cycle Status:   Active ; Vocabulary:   SNOMED CT        No contraindication to deep vein thrombosis (DVT) prophylaxis (SNOMED CT  :834196 )  Name of Problem:   No contraindication to deep vein thrombosis (DVT) prophylaxis ; Recorder:   [name], MD, Madlyn Frankel; Confirmation:   Confirmed ; Classification:   Medical ; Code:   [id number] ; Contributor System:   PowerChart ; Last Updated:   12/28/2017 18:29 EDT ; Life Cycle Status:   Active ; Responsible Provider:   [name], MD, Madlyn Frankel; Vocabulary:   SNOMED CT        Vertigo (SNOMED CT  [phone] )  Name of Problem:   Vertigo ; Recorder:   Domingo Sep; Confirmation:   Confirmed ; Classification:   Patient Stated ; Code:   [id number] ; Contributor System:   PowerChart ; Last Updated:   10/09/2020 14:17 EDT ; Life Cycle Date:   10/09/2020 ; Life Cycle Status:   Active ; Vocabulary:   SNOMED CT          Diagnoses(Active)    Leg pain-swelling  Date:   01/16/2021 ; Diagnosis Type:   Reason For Visit ; Confirmation:   Complaint of ; Clinical Dx:   Leg pain-swelling ; Classification:   Medical ; Clinical Service:   Emergency medicine ; Code:    PNED ; Probability:   0 ; Diagnosis Code:   [id number]

## 2021-01-17 LAB — CULTURE, URINE: FINAL REPORT: 75000

## 2021-04-28 ENCOUNTER — Inpatient Hospital Stay
Admit: 2021-04-28 | Discharge: 2021-04-29 | Disposition: A | Discharge status: Home / Self Care | Payer: MEDICARE | Attending: Emergency Medicine

## 2021-04-28 ENCOUNTER — Emergency Department: Admit: 2021-04-29 | Payer: MEDICARE

## 2021-04-28 DIAGNOSIS — I10 Essential (primary) hypertension: Secondary | ICD-10-CM

## 2021-04-28 DIAGNOSIS — R072 Precordial pain: Secondary | ICD-10-CM

## 2021-04-28 NOTE — ED Notes (Signed)
Purewick placed on this pt per family request at this time.      Wynonia Lawman, RN  04/28/21 2219

## 2021-04-28 NOTE — ED Notes (Addendum)
[facility] EMERGENCY DEPT  EMERGENCY DEPARTMENT ENCOUNTER      Pt Name: Allison [name]  MRN: [id number]  Birthdate [birth year]  Date of evaluation: 04/28/2021  Provider: Charlsie Quest, DO    CHIEF COMPLAINT       Chief Complaint   Patient presents with    Chest Pain     Pt c/o chest pain on both right and left side x few days. Pt also c/o intermittent SOB that is worse at night. PCP ordered chest XR for Tuesday and was going to do EKG but pt states pain is worse and couldn't wait.          HISTORY OF PRESENT ILLNESS    HPI Allison [name] emerged department chief complaint of chest pain.  Patient reports that she has had intermittent chest pain for past several weeks.  She reports pain has been coming and going.  She is rather vague regarding the pain however she reports that the pain seems to be worse in the left side of the chest however it occasionally will radiate to the right side of the chest she indicates the pain is worse just inferior to her breast bilaterally.  She reports occasional abdominal pain.  She denies shortness of breath or difficulty breathing.  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  Patient states that she was told by her primary care physician that she would have an EKG and chest x-ray to be performed on Tuesday however the pain became worse and patient was brought to the emergency department for further evaluation treatment.  Upon arrival in emergency department patient was noted to be profoundly hypertensive.  She reportedly has been taking her medications appropriately she is on amlodipine and family will occasionally give the patient lisinopril if she has elevated blood pressure.  Review of the prior medical records reveals    Nursing Notes were reviewed.    REVIEW OF SYSTEMS     Review of Systems   Constitutional:  Negative for activity change, chills, diaphoresis and fever.   HENT:  Negative for  congestion, ear pain and sore throat.    Eyes:  Negative for visual disturbance.   Respiratory:  Negative for cough and shortness of breath.    Cardiovascular:  Negative for chest pain and leg swelling.   Gastrointestinal:  Negative for abdominal pain, constipation, diarrhea, nausea and vomiting.   Endocrine: Negative.    Genitourinary: Negative.    Musculoskeletal:  Negative for back pain and myalgias.   Neurological:  Negative for headaches.   All other systems reviewed and are negative.    Except as noted above the remainder of the review of systems was reviewed and negative.     PAST MEDICAL HISTORY   No past medical history on file.    SURGICAL HISTORY     No past surgical history on file.    CURRENT MEDICATIONS       Previous Medications    AMLODIPINE (NORVASC) 5 MG TABLET    Take 5 mg by mouth daily    ASPIRIN 81 MG CHEWABLE TABLET    Take 81 mg by mouth daily    FUROSEMIDE (LASIX) 20 MG TABLET    Take 20 mg by mouth daily    LISINOPRIL (PRINIVIL;ZESTRIL) 20 MG TABLET    Take 20 mg by mouth as needed    MECLIZINE (ANTIVERT) 12.5 MG TABLET  Take 25 mg by mouth as needed       ALLERGIES     Shrimp (diagnostic)    FAMILY HISTORY     No family history on file.     SOCIAL HISTORY       Social History     Socioeconomic History    Marital status: Widowed       SCREENINGS                               CIWA Assessment  BP: (!) 187/102  Heart Rate: 72                 PHYSICAL EXAM       ED Triage Vitals   BP Temp Temp src Heart Rate Resp SpO2 Height Weight   04/28/21 1853 04/28/21 1853 -- 04/28/21 1850 04/28/21 1850 04/28/21 1850 04/28/21 1850 04/28/21 1850   (!) 216/104 98 ??F (36.7 ??C)  70 20 98 % 5\' 6"  (1.676 m) 124 lb (56.2 kg)       Physical Exam  Vitals and nursing note reviewed.   Constitutional:       General: She is not in acute distress.     Comments: Frail-appearing   HENT:      Head: Normocephalic and atraumatic.      Mouth/Throat:      Mouth: Mucous membranes are moist.      Pharynx: No oropharyngeal  exudate or posterior oropharyngeal erythema.   Eyes:      Extraocular Movements: Extraocular movements intact.      Pupils: Pupils are equal, round, and reactive to light.   Cardiovascular:      Rate and Rhythm: Normal rate and regular rhythm.      Pulses: Normal pulses.      Heart sounds: Murmur (2/6 systolic ejection murmur noted at the base the heart.) heard.     No friction rub. No gallop.   Pulmonary:      Effort: Pulmonary effort is normal.      Breath sounds: Normal breath sounds. No wheezing.   Abdominal:      Palpations: Abdomen is soft. There is no mass.      Tenderness: There is no abdominal tenderness.   Musculoskeletal:         General: Swelling present.      Cervical back: Normal range of motion.   Skin:     General: Skin is warm and dry.      Capillary Refill: Capillary refill takes less than 2 seconds.   Neurological:      General: No focal deficit present.      Mental Status: She is alert.       Procedures    DIAGNOSTIC RESULTS     EKG: All EKG's are interpreted by the Emergency Department Physician who either signs or Co-signs this chart in the absence of a cardiologist.    RADIOLOGY:   CTA CHEST W WO CONTRAST PE Eval   Final Result      No pulmonary embolism.      Chronic bronchial wall thickening with areas of mucous plugging and associated    volume loss again noted primarily in the right lower lobe and lingula. No    definite new pulmonary nodules or consolidations appreciated. Continued    attention on follow-up recommended.          LABS:  Labs Reviewed   COMPREHENSIVE METABOLIC PANEL -  Abnormal; Notable for the following components:       Result Value    Est, Glom Filt Rate 70 (*)     All other components within normal limits   CBC   TROPONIN   TROPONIN       All other labs were within normal range or not returned as of this dictation.    EMERGENCY DEPARTMENT COURSE/REASSESSMENT and MDM:   MDM  Number of Diagnoses or Management Options  Hypertension, unspecified type  Precordial  pain  Uncontrolled hypertension  Diagnosis management comments: Arrived in the emergency department she was noted to be profoundly hypertensive on arrival.  Initial consideration was given to the fact that she was having discomfort.  She did undergo an EKG and troponin both which were unremarkable.  She was sent for CT of the chest to rule out recurrent pulmonary embolism.  There is no signs of recurrent pulmonary embolism.  She underwent a second troponin which was negative.  Of note patient's blood pressure continued to climb the fact that she did that she was pain-free.  She was given enalapril 1.25 with transient improvement of blood pressure however the blood pressure continued to rise.  Eventually she was started on nicardipine drip.  She improved remarkably well on the nicardipine drip for period of time we were able to titrate the nicardipine drip down and off.  Ultimately the patient will require admission to the hospital for control of blood pressure and medication adjustment and continue work-up of her patient's chest pain.  [facility]  It was discussed with Dr. Suzanna ObeySuda who will accept the patient in transfer.       Amount and/or Complexity of Data Reviewed  Clinical lab tests: ordered and reviewed  Tests in the radiology section of CPT??: ordered and reviewed  Tests in the medicine section of CPT??: ordered and reviewed  Decide to obtain previous medical records or to obtain history from someone other than the patient: yes  Review and summarize past medical records: yes  Discuss the patient with other providers: yes  Independent visualization of images, tracings, or specimens: yes    Risk of Complications, Morbidity, and/or Mortality  Presenting problems: high  Diagnostic procedures: high  Management options: high    Critical Care  Total time providing critical care: 30-74 minutes             FINAL IMPRESSION      1. Precordial pain    2.  Hypertension, unspecified type    3. Uncontrolled hypertension          DISPOSITION/PLAN   DISPOSITION Decision To Transfer 04/29/2021 12:11:06 AM      PATIENT REFERRED TO:  No follow-up provider specified.    DISCHARGE MEDICATIONS:  New Prescriptions    No medications on file     Controlled Substances Monitoring:     No flowsheet data found.    (Please note that portions of this note were completed with a voice recognition program.  Efforts were made to edit the dictations but occasionally words are mis-transcribed.)    Charlsie QuestAdam Howard Jhase Creppel, DO (electronically signed)  Attending Emergency Physician           Charlsie QuestAdam Howard Patty Leitzke, DO  04/29/21 0028       Charlsie QuestAdam Howard Tomica Arseneault, DO  04/29/21 [phone]

## 2021-04-29 ENCOUNTER — Inpatient Hospital Stay
Admit: 2021-04-29 | Discharge: 2021-05-06 | Disposition: A | Discharge status: Rehabilitation Facility | Payer: MEDICARE | Attending: Internal Medicine | Admitting: Internal Medicine

## 2021-04-29 DIAGNOSIS — I161 Hypertensive emergency: Secondary | ICD-10-CM

## 2021-04-29 LAB — COMPREHENSIVE METABOLIC PANEL
ALT: 11 U/L (ref 0–35)
AST: 23 U/L (ref 0–35)
Albumin/Globulin Ratio: 1.3 (ref 1.00–2.70)
Albumin: 3.9 g/dL (ref 3.5–5.2)
Alk Phosphatase: 52 U/L (ref 35–117)
Anion Gap: 11 mmol/L (ref 2–17)
BUN: 14 mg/dL (ref 8–23)
CO2: 28 mmol/L (ref 22–29)
Calcium: 9.4 mg/dL (ref 8.8–10.2)
Chloride: 103 mmol/L (ref 98–107)
Creatinine: 0.8 mg/dL (ref 0.5–1.0)
Est, Glom Filt Rate: 70 mL/min/1.73m?? — ABNORMAL LOW (ref >=90)
Globulin: 3 g/dL (ref 1.9–4.4)
Glucose: 87 mg/dL (ref 70–99)
OSMOLALITY CALCULATED: 283 mOsm/kg (ref 270–287)
Potassium: 3.9 mmol/L (ref 3.5–5.3)
Sodium: 142 mmol/L (ref 135–145)
Total Bilirubin: 0.4 mg/dL (ref 0.00–1.20)
Total Protein: 6.9 g/dL (ref 6.4–8.3)

## 2021-04-29 LAB — CBC
Hematocrit: 36.6 % (ref 34.0–47.0)
Hemoglobin: 12.8 g/dL (ref 11.5–15.7)
MCH: 28.6 pg (ref 27.0–34.5)
MCHC: 35 g/dL (ref 32.0–36.0)
MCV: 81.9 fL (ref 81.0–99.0)
MPV: 10.9 fL (ref 7.2–13.2)
NRBC Absolute: 0 10*3/uL (ref 0.000–0.012)
NRBC Automated: 0 % (ref 0.0–0.2)
Platelets: 183 10*3/uL (ref 140–440)
RBC: 4.47 x10e6/mcL (ref 3.60–5.20)
RDW: 13.1 % (ref 11.0–16.0)
WBC: 6 10*3/uL (ref 3.8–10.6)

## 2021-04-29 LAB — TROPONIN
Troponin T: <0.01 ng/mL (ref 0.000–0.010)
Troponin T: <0.01 ng/mL (ref 0.000–0.010)

## 2021-04-29 MED ORDER — HYDRALAZINE HCL 25 MG PO TABS
25 MG | Freq: Three times a day (TID) | ORAL | Status: DC
Start: 2021-04-29 — End: 2021-04-30
  Administered 2021-04-29 – 2021-04-30 (×3): 25 mg via ORAL

## 2021-04-29 MED ORDER — LISINOPRIL 20 MG PO TABS
20 MG | Freq: Every day | ORAL | Status: DC
Start: 2021-04-29 — End: 2021-04-29

## 2021-04-29 MED ORDER — POLYETHYLENE GLYCOL 3350 17 G PO PACK
17 g | Freq: Every day | ORAL | Status: AC | PRN
Start: 2021-04-29 — End: 2021-05-06
  Administered 2021-04-30 – 2021-05-06 (×2): 17 g via ORAL

## 2021-04-29 MED ORDER — ONDANSETRON HCL 4 MG/2ML IJ SOLN
4 MG/2ML | Freq: Four times a day (QID) | INTRAMUSCULAR | Status: AC | PRN
Start: 2021-04-29 — End: 2021-05-06

## 2021-04-29 MED ORDER — LISINOPRIL 20 MG PO TABS
20 MG | Freq: Every day | ORAL | Status: DC
Start: 2021-04-29 — End: 2021-05-02
  Administered 2021-04-29 – 2021-05-01 (×3): 20 mg via ORAL

## 2021-04-29 MED ORDER — IOPAMIDOL 76 % IV SOLN
76 % | Freq: Once | INTRAVENOUS | Status: AC | PRN
Start: 2021-04-29 — End: 2021-04-28
  Administered 2021-04-29: 03:00:00 100 mL via INTRAVENOUS

## 2021-04-29 MED ORDER — ASPIRIN 81 MG PO CHEW
81 MG | Freq: Every day | ORAL | Status: AC
Start: 2021-04-29 — End: 2021-05-06
  Administered 2021-04-29 – 2021-05-06 (×8): 81 mg via ORAL

## 2021-04-29 MED ORDER — AMLODIPINE BESYLATE 5 MG PO TABS
5 MG | Freq: Every day | ORAL | Status: AC
Start: 2021-04-29 — End: 2021-04-30
  Administered 2021-04-29: 14:00:00 5 mg via ORAL

## 2021-04-29 MED ORDER — NORMAL SALINE FLUSH 0.9 % IV SOLN
0.9 % | INTRAVENOUS | Status: AC | PRN
Start: 2021-04-29 — End: 2021-05-06

## 2021-04-29 MED ORDER — ALUM & MAG HYDROXIDE-SIMETH 200-200-20 MG/5ML PO SUSP
200-200-20 MG/5ML | Freq: Four times a day (QID) | ORAL | Status: AC | PRN
Start: 2021-04-29 — End: ?

## 2021-04-29 MED ORDER — NICARDIPINE HCL IN NACL 20-0.9 MG/200ML-% IV SOLN
INTRAVENOUS | Status: AC
Start: 2021-04-29 — End: 2021-05-02
  Administered 2021-04-29: 13:00:00 2.5 mg/h via INTRAVENOUS

## 2021-04-29 MED ORDER — SODIUM CHLORIDE 0.9 % IV SOLN
0.9 % | INTRAVENOUS | Status: AC | PRN
Start: 2021-04-29 — End: ?

## 2021-04-29 MED ORDER — ACETAMINOPHEN 650 MG RE SUPP
650 | Freq: Four times a day (QID) | RECTAL | Status: DC | PRN
Start: 2021-04-29 — End: 2021-05-06

## 2021-04-29 MED ORDER — HYDRALAZINE HCL 20 MG/ML IJ SOLN
20 MG/ML | Freq: Four times a day (QID) | INTRAMUSCULAR | Status: AC | PRN
Start: 2021-04-29 — End: 2021-05-06

## 2021-04-29 MED ORDER — NORMAL SALINE FLUSH 0.9 % IV SOLN
0.9 % | Freq: Two times a day (BID) | INTRAVENOUS | Status: AC
Start: 2021-04-29 — End: 2021-05-05
  Administered 2021-04-29 – 2021-05-03 (×8): 10 mL via INTRAVENOUS

## 2021-04-29 MED ORDER — ONDANSETRON 4 MG PO TBDP
4 MG | Freq: Three times a day (TID) | ORAL | Status: DC | PRN
Start: 2021-04-29 — End: 2021-05-06
  Administered 2021-05-06: 14:00:00 4 mg via ORAL

## 2021-04-29 MED ORDER — ACETAMINOPHEN 325 MG PO TABS
325 | Freq: Four times a day (QID) | ORAL | Status: DC | PRN
Start: 2021-04-29 — End: 2021-05-06
  Administered 2021-05-02 – 2021-05-05 (×3): 650 mg via ORAL

## 2021-04-29 MED ORDER — LISINOPRIL 20 MG PO TABS
20 MG | ORAL | Status: AC
Start: 2021-04-29 — End: 2021-04-29
  Administered 2021-04-29: 05:00:00 20 via ORAL

## 2021-04-29 MED ORDER — NICARDIPINE HCL IN NACL 20-0.9 MG/200ML-% IV SOLN
INTRAVENOUS | Status: DC
Start: 2021-04-29 — End: 2021-04-29
  Administered 2021-04-29: 04:00:00 5 mg/h via INTRAVENOUS

## 2021-04-29 MED ORDER — FUROSEMIDE 20 MG PO TABS
20 MG | Freq: Every day | ORAL | Status: AC
Start: 2021-04-29 — End: 2021-05-06
  Administered 2021-04-29 – 2021-05-06 (×8): 20 mg via ORAL

## 2021-04-29 MED ORDER — ENALAPRILAT 1.25 MG/ML IV INJ
1.25 MG/ML | INTRAVENOUS | Status: AC
Start: 2021-04-29 — End: 2021-04-28
  Administered 2021-04-29: 03:00:00 1.25 via INTRAVENOUS

## 2021-04-29 MED ORDER — APIXABAN 5 MG PO TABS
5 MG | Freq: Two times a day (BID) | ORAL | Status: AC
Start: 2021-04-29 — End: 2021-05-06
  Administered 2021-04-29 – 2021-05-06 (×15): 5 mg via ORAL

## 2021-04-29 MED ORDER — ENALAPRILAT 1.25 MG/ML IV INJ
1.25 MG/ML | Freq: Once | INTRAVENOUS | Status: AC
Start: 2021-04-29 — End: 2021-04-28

## 2021-04-29 MED FILL — ASPIRIN 81 MG PO CHEW: 81 MG | ORAL | Qty: 1

## 2021-04-29 MED FILL — AMLODIPINE BESYLATE 5 MG PO TABS: 5 MG | ORAL | Qty: 1

## 2021-04-29 MED FILL — ENALAPRILAT 1.25 MG/ML IV INJ: 1.25 MG/ML | INTRAVENOUS | Qty: 2

## 2021-04-29 MED FILL — LISINOPRIL 20 MG PO TABS: 20 MG | ORAL | Qty: 1

## 2021-04-29 MED FILL — BD POSIFLUSH 0.9 % IV SOLN: 0.9 % | INTRAVENOUS | Qty: 10

## 2021-04-29 MED FILL — NICARDIPINE HCL IN NACL 20-0.9 MG/200ML-% IV SOLN: INTRAVENOUS | Qty: 200

## 2021-04-29 MED FILL — FUROSEMIDE 20 MG PO TABS: 20 MG | ORAL | Qty: 1

## 2021-04-29 MED FILL — ELIQUIS 5 MG PO TABS: 5 MG | ORAL | Qty: 1

## 2021-04-29 MED FILL — HYDRALAZINE HCL 25 MG PO TABS: 25 MG | ORAL | Qty: 1

## 2021-04-29 NOTE — ED Notes (Signed)
Cardene infusion had to be "stopped" to be able to dc pt, infusion is still running at 2.5mg /hr upon transport team leaving [facility] ED.      Wynonia Lawman, RN  04/29/21 [phone]

## 2021-04-29 NOTE — H&P (Signed)
Hospitalist History & Physical     Patient Name: Allison [name]  MRN: [id number]  Date of Birth: 01-18-1929  Age: 88 y.o.  Room Number: 4098/11  Date of Service: 04/29/21  Admit Date: 04/29/2021  Primary Care Physician: No primary care provider on file.  Admitting Provider: Nelida Gores, MD    CHIEF COMPLAINT: Chest pain    Reason for Admission:  Hypertensive emergency     History Obtained From:  patient, electronic medical record, patient's daughter    History of Present Illness     88 yo F with history of PE on eliquis, HTN, HLD presented to the emergency department at [facility] today due to chest pain.  She has had symptoms for quite some time.  She was seen by her outpatient providers and was scheduled for chest x-ray and EKG to be performed this Tuesday.  However, the pain became so severe this evening as she presented to the emergency department.  It is difficult to ascertain where her pain is located.  It sounds as it is generally left-sided and radiates throughout her chest.  No correlation with rest or exertion.  No nausea, vomiting, diaphoresis.  No lightheadedness, dizziness, syncope.  No palpitations.  No exacerbating or relieving factors.  The pain comes on suddenly, lasts very briefly (approximately 30 seconds), and then resolves.  She is currently chest pain free.  Incidentally, she was noted to have elevated blood pressures in the emergency department.  She was given enalapril and lisinopril in the ED, but eventually, she required control with a cardene drip.  Thus, hospitalist was consulted for admission.  She was transferred to [place] due to bed availability.    Past Medical History     HTN  HLD  PE/DVT    Past Surgical History      Ex lap (uterine fibroids)    Home Medications     Prior to Admission medications    Medication Sig Start Date End Date Taking? Authorizing Provider   Apixaban (ELIQUIS PO) Take 5 mg by mouth 2 times daily   Yes Historical Provider, MD   amLODIPine (NORVASC) 5 MG  tablet Take 5 mg by mouth daily    Historical Provider, MD   lisinopril (PRINIVIL;ZESTRIL) 20 MG tablet Take 20 mg by mouth as needed    Historical Provider, MD   furosemide (LASIX) 20 MG tablet Take 20 mg by mouth daily    Historical Provider, MD   meclizine (ANTIVERT) 12.5 MG tablet Take 25 mg by mouth as needed    Historical Provider, MD   aspirin 81 MG chewable tablet Take 81 mg by mouth daily    Historical Provider, MD       Allergies:    Shrimp (diagnostic), Strawberry, Chocolate, and Peach [prunus persica]      Social History     Social History     Socioeconomic History    Marital status: Widowed     Spouse name: Not on file    Number of children: Not on file    Years of education: Not on file    Highest education level: Not on file   Occupational History    Not on file   Tobacco Use    Smoking status: Never     Passive exposure: Never    Smokeless tobacco: Never   Vaping Use    Vaping Use: Never used   Substance and Sexual Activity    Alcohol use: Not on file  Drug use: Never    Sexual activity: Never   Other Topics Concern    Not on file   Social History Narrative    Not on file     Social Determinants of Health     Financial Resource Strain: Not on file   Food Insecurity: Not on file   Transportation Needs: Not on file   Physical Activity: Not on file   Stress: Not on file   Social Connections: Not on file   Intimate Partner Violence: Not on file   Housing Stability: Not on file     Denies smoking, alcohol, illicits    Family History     Noncontributory based on age and reason for admission    Review of Systems     All 14 review of systems have been reviewed and are negative except as mentioned in HPI    Physical Exam     Vitals:  BP 130/72    Pulse 74    Temp 98.4 ??F (36.9 ??C) (Oral)    Resp 18    Ht '5\' 6"'$  (1.676 m)    Wt 126 lb 15.8 oz (57.6 kg)    BMI 20.50 kg/m??   Body mass index is 20.5 kg/m??.   General: No acute distress, alert  Head/Neck/Throat: Normocephalic, atraumatic, trachea midline, no  lymphadenopathy  Eyes: Extraocular movement intact, sclerae anicteric  Cardiovascular: Regular rate and rhythm, systolic murmur  Respiratory: Clear to auscultation bilaterally. Good effort. Good air movement throughout  Abdomen: Soft, nontender, nondistended.  Bowel sounds normoactive  Extremities: No clubbing, cyanosis, or edema. Normal ROM  Neuro: Cranial nerves II-XII grossly intact. Moving extremities freely. No gross sensory deficits  Psych: Alert, oriented x 3, calm, cooperative    LABS:  Recent Results (from the past 24 hour(s))   EKG 12 Lead    Collection Time: 04/28/21  6:54 PM   Result Value Ref Range    Ventricular Rate 66 BPM    P-R Interval 185 ms    QRS Duration 109 ms    Q-T Interval 394 ms    QTc Calculation (Bazett) 414 ms    P Axis 63 degrees    R Axis 6 degrees    T Axis 30 degrees    Diagnosis       SINUS RHYTHM  NONSPECIFIC ST   BORDERLINE ECG  Reviewed by _________________     CBC    Collection Time: 04/28/21  8:23 PM   Result Value Ref Range    WBC 6.0 3.8 - 10.6 x10e3/mcL    RBC 4.47 3.60 - 5.20 x10e6/mcL    Hemoglobin 12.8 11.5 - 15.7 g/dL    Hematocrit 36.6 34.0 - 47.0 %    MCV 81.9 81.0 - 99.0 fL    MCH 28.6 27.0 - 34.5 pg    MCHC 35.0 32.0 - 36.0 g/dL    RDW 13.1 11.0 - 16.0 %    Platelets 183 140 - 440 x10e3/mcL    MPV 10.9 7.2 - 13.2 fL    NRBC Automated 0.0 0.0 - 0.2 %    NRBC Absolute 0.000 0.000 - 0.012 x10e3/mcL   Comprehensive Metabolic Panel    Collection Time: 04/28/21  8:23 PM   Result Value Ref Range    Sodium 142 135 - 145 mmol/L    Potassium 3.9 3.5 - 5.3 mmol/L    Chloride 103 98 - 107 mmol/L    CO2 28 22 - 29 mmol/L    Glucose 87 70 -  99 mg/dL    BUN 14 8 - 23 mg/dL    Creatinine 0.8 0.5 - 1.0 mg/dL    Anion Gap 11 2 - 17 mmol/L    OSMOLALITY CALCULATED 283 270 - 287 mOsm/kg    Calcium 9.4 8.8 - 10.2 mg/dL    Total Protein 6.9 6.4 - 8.3 g/dL    Albumin 3.9 3.5 - 5.2 g/dL    Globulin 3.0 1.9 - 4.4 g/dL    Albumin/Globulin Ratio 1.30 1.00 - 2.70    Total Bilirubin 0.40 0.00 -  1.20 mg/dL    Alk Phosphatase 52 35 - 117 unit/L    AST 23 0 - 35 unit/L    ALT 11 0 - 35 unit/L    Est, Glom Filt Rate 70 (L) >=90 mL/min/1.45m?   Troponin    Collection Time: 04/28/21  8:23 PM   Result Value Ref Range    Troponin T <0.010 0.000 - 0.010 ng/mL   Troponin    Collection Time: 04/28/21 11:05 PM   Result Value Ref Range    Troponin T <0.010 0.000 - 0.010 ng/mL       IMAGING:  CTA CHEST W WO CONTRAST PE Eval    Result Date: 04/28/2021  No pulmonary embolism. Chronic bronchial wall thickening with areas of mucous plugging and associated volume loss again noted primarily in the right lower lobe and lingula. No definite new pulmonary nodules or consolidations appreciated. Continued attention on follow-up recommended.       Assessment and Plan     Hypertensive emergency  Attempted to DC drip in ED, however, systolic jumped to 2297  Continue nicardipine drip, resume home meds.  Titrate off as tolerated.  Has follow up with PCP tomorrow.    Chest pain  Negative troponin x 2.  EKG without specific ischemic changes.  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 This is different from her last admission (DNR).      SNelida Gores MD  04/29/2021  3:32 AM    This note was created using voice recognition software and may contain typographic errors missed during final review. The intent is to have a complete and accurate medical record.  As a valued partner in this safety effort, if you have noted factual errors, please complete the Health Information Amendment/Correct Form or call the [place] at [phone]

## 2021-04-29 NOTE — Care Coordination-Inpatient (Signed)
04/29/21 1133   Service Assessment   Patient Orientation Alert and Oriented;Person;Place;Situation;Self   Cognition Alert   History Provided By Patient   Primary Caregiver Self   Accompanied By/Relationship Charyl Dancer   Support Systems Family Members   PCP Verified by CM Yes  Manon Hilding)   Last Visit to PCP Within last 3 months   Prior Functional Level Independent in ADLs/IADLs   Current Functional Level Independent in ADLs/IADLs   Can patient return to prior living arrangement Yes   Ability to make needs known: Good   Family able to assist with home care needs: Yes   Would you like for me to discuss the discharge plan with any other family members/significant others, and if so, who? Yes  (Tabitha Wilson)   [facility] None   Social/Functional History   Lives With   (Granddaughter)   Type of Home House   Home Layout One level   Home Access   (one step)   Home Equipment Rollator;Cane   Receives Help From Family   ADL Assistance Independent   Homemaking Assistance Independent   Ambulation Assistance Independent   Transfer Assistance Independent   Discharge Planning   Type of Residence House   Living Arrangements Family Members   Current Services Prior To Admission Durable Medical Equipment   Current DME Prior to [employer]   Potential Assistance Needed N/A   DME Ordered? No   Potential Assistance Purchasing Medications No   Type of Home Care Services None   Patient expects to be [facility] to: [facility] At/After Discharge   Transition of Care Consult (CM Consult) Home Health   Internal Home Health No   Reason Outside Agency Chosen Patient already serviced by other home care/hospice agency   Services At/After Discharge Home Health       IA completed. IADL's. Family support. No HHC. Has used [facility] in the past. DME, cane and rollator. Does not recall DME company. Corporate investment banker. Family to provide transportation home.  No CM needs anticipated. CM available if needs  arise prior to discharge.

## 2021-04-29 NOTE — Progress Notes (Signed)
[facility] Hospitalist Service     Hospitalist Progress Note     PCP: Pcp No   Admission Date: 04/29/2021    Interval Hx:   Ms. Mccready admitted overnight for hypertensive emergency on cardene gtt. BP this AM improved. She denies chest pain or shortness of breath this AM. Cardene gtt was stopped after receiving home antihypertensives this AM but SBP trended back up to 180. She remains asymptomatic today. Family member at bedside.     Review of Systems     14 point ROS performed and negative apart from interval hx above    Physical Exam   Vitals: BP (!) 124/57    Pulse 63    Temp 98.2 ??F (36.8 ??C) (Oral)    Resp 17    Ht '5\' 6"'$  (1.676 m)    Wt 126 lb 15.8 oz (57.6 kg)    SpO2 94%    BMI 20.50 kg/m?? Temp (24hrs), Avg:98.1 ??F (36.7 ??C), Min:97.9 ??F (36.6 ??C), Max:98.4 ??F (36.9 ??C)    General - Frail, pleasant, elderly female, sitting up in bed, awake and responsive, NAD   HEENT - NCAT, non-icteric sclera, no subconjunctival pallor, moist mucous membranes   Cardio - RRR, without MRG  Pulm - CTA bilaterally, no wheezing, normal respiratory effort   GI - soft, ND NT with normoactive BS, absent guarding or rebound tenderness  Ext - symmetrical muscle strength bilateral upper and lower extremities, good peripheral pulses bilateral UE / LE, no edema  Skin - no rashes or suspicious lesions observed; no localized erythema   Neuro - no focal weakness, no slurred speech, no facial droop  Psychiatric - appropriate mood and affect    Labs and Imaging   Data: I have personally reviewed all lab results and independently reviewed imaging studies performed in the past 24 hours.    Hematologic/Coags Chemistries   Recent Labs     04/28/21  2023   WBC 6.0   HGB 12.8   HCT 36.6   PLT 183     Lab Results   Component Value Date/Time    PROT 6.9 04/28/2021 08:23 PM    ALBUMIN 1.30 04/28/2021 08:23 PM     No components found for: HGBA1C  Lab Results   Component Value Date/Time    INR 1.1 10/09/2020 04:23 PM    PROTIME 14.3 10/09/2020 04:23 PM      Lab Results   Component Value Date/Time    APTT 164.9 10/10/2020 03:39 AM     No results found for: DDIMER   Recent Labs     04/28/21  2023   NA 142   K 3.9   CL 103   CO2 28   BUN 14   CREATININE 0.8   ALBUMIN 1.30   BILITOT 0.40   ALKPHOS 52   AST 23   ALT 11     No results for input(s): GLU in the last 72 hours.  No results found for: CPK, CKMB, TROPONINI  No results found for: IRON, FERRITIN     Inflammatory/Respiratory Diabetes   No results found for: CRP  No results found for: ESR  ABGs:  No results found for: PHART, PO2ART, HCO3, PCO2ART   Lab Results   Component Value Date/Time    CREATININE 0.8 04/28/2021 08:23 PM              Imaging  CTA CHEST W WO CONTRAST PE Eval    Result Date: 04/28/2021  CTA chest: 04/28/21 INDICATION: PE  COMPARISON: June 2022 CT TECHNIQUE: PE protocol (Postcontrast imaging from the thoracic inlet through the  hemidiaphragms in the pulmonary arterial phase. Axial 5x5 mm soft tissue and lung 2x2 mm images. Multiplanar 3-D volumetric MIP reconstructions through the pulmonary arteries per protocol.) CT scanning was performed using radiation dose  reduction techniques when appropriate, per system protocols. FINDINGS: Pulmonary arteries: No evidence of a pulmonary embolus. Lungs/Airways: Diffuse bronchial wall thickening present. There is some mild mucous plugging and associated subsegmental atelectasis noted in the right lower  lobe and to a lesser degree the lingula. No discretely suspicious pulmonary parenchymal nodules. Stable area of subpleural reticulation in the left upper lobe peripherally. Pleura: No pleural effusion or pneumothorax. Lymph Nodes: No mediastinal, hilar, or axillary adenopathy. Cardiovascular: No aortic aneurysm. No acute aortic pathology. Moderate coronary  artery calcifications. Left ventricular myocardial thickening. Normal heart size. No pericardial effusion. Osseous structures: Somewhat heterogeneous appearance of the bones, similar to the prior study. No  discretely suspicious lytic or blastic osseous lesion. Upper Abdomen: No acute findings in the upper abdomen. Mild thickening of the adrenal glands. Moderate amount of stool in the colon.     No pulmonary embolism. Chronic bronchial wall thickening with areas of mucous plugging and associated volume loss again noted primarily in the right lower lobe and lingula. No definite new pulmonary nodules or consolidations appreciated. Continued attention on follow-up recommended.         Medications        sodium chloride flush  5-40 mL IntraVENous 2 times per day    amLODIPine  5 mg Oral Daily    apixaban  5 mg Oral BID    aspirin  81 mg Oral Daily    furosemide  20 mg Oral Daily    lisinopril  20 mg Oral Daily    hydrALAZINE  25 mg Oral 3 times per day      niCARdipine Stopped (04/29/21 0903)    sodium chloride       sodium chloride flush, sodium chloride, ondansetron **OR** ondansetron, polyethylene glycol, aluminum & magnesium hydroxide-simethicone, acetaminophen **OR** acetaminophen, hydrALAZINE      Assessment & Plan     Patient Active Problem List    Diagnosis Date Noted    Hypertensive emergency 04/29/2021     Hypertensive emergency  Attempted to DC drip in ED, however, systolic jumped to 222.  Cardene drip was Dc'd this AM after she received home medications. BP initially improved but later in the afternoon SBP increased to 180. Patient remains asymptomatic.   -Continue home amlodipine 10 mg daily, lisinopril 20 mg daily, lasix 20 mg daily.   -Will add hydralazine 25  mg TID and hydralazine 10 mg IV PRN for SBP>180   -Titrate regimen as appropriate based on 24 hour trend      Chest pain  Negative troponin x 2.  EKG without ischemic changes.  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 This is different from her last admission (DNR).  DISPO: continued admission under observation for  management of HTN     Hadyn Blanck Douglass Rivers, DO  04/29/2021 2:54 PM  [facility] Hospitalist Service

## 2021-04-30 LAB — CBC WITH AUTO DIFFERENTIAL
Absolute Baso #: 0.1 10*3/uL (ref 0.0–0.2)
Absolute Eos #: 0.3 10*3/uL (ref 0.0–0.5)
Absolute Lymph #: 2.6 10*3/uL (ref 1.0–3.2)
Absolute Mono #: 0.7 10*3/uL (ref 0.3–1.0)
Basophils %: 1 % (ref 0.0–2.0)
Eosinophils %: 4.8 % (ref 0.0–7.0)
Hematocrit: 35.9 % (ref 34.0–47.0)
Hemoglobin: 12.2 g/dL (ref 11.5–15.7)
Immature Grans (Abs): 0.01 10*3/uL (ref 0.00–0.06)
Immature Granulocytes: 0.2 % (ref 0.0–0.6)
Lymphocytes: 44.9 % (ref 15.0–45.0)
MCH: 27.9 pg (ref 27.0–34.5)
MCHC: 34 g/dL (ref 32.0–36.0)
MCV: 82 fL (ref 81.0–99.0)
MPV: 10.6 fL (ref 7.2–13.2)
Monocytes: 11.3 % (ref 4.0–12.0)
NRBC Absolute: 0 10*3/uL (ref 0.000–0.012)
NRBC Automated: 0 % (ref 0.0–0.2)
Neutrophils %: 37.8 % — ABNORMAL LOW (ref 42.0–74.0)
Neutrophils Absolute: 2.2 10*3/uL (ref 1.6–7.3)
Platelets: 134 10*3/uL — ABNORMAL LOW (ref 140–440)
RBC: 4.38 x10e6/mcL (ref 3.60–5.20)
RDW: 13.5 % (ref 11.0–16.0)
WBC: 5.8 10*3/uL (ref 3.8–10.6)

## 2021-04-30 LAB — BASIC METABOLIC PANEL W/ REFLEX TO MG FOR LOW K
Anion Gap: 10 mmol/L (ref 2–17)
BUN: 16 mg/dL (ref 8–23)
CO2: 27 mmol/L (ref 22–29)
Calcium: 8.5 mg/dL — ABNORMAL LOW (ref 8.8–10.2)
Chloride: 104 mmol/L (ref 98–107)
Creatinine: 0.8 mg/dL (ref 0.5–1.0)
Est, Glom Filt Rate: 70 mL/min/1.73m?? — ABNORMAL LOW (ref >=90)
Glucose: 90 mg/dL (ref 70–99)
OSMOLALITY CALCULATED: 282 mOsm/kg (ref 270–287)
Potassium: 3.7 mmol/L (ref 3.5–5.3)
Sodium: 141 mmol/L (ref 135–145)

## 2021-04-30 LAB — IMMATURE CELLS
IMMATURE PLT ABSOLUTE: 8.8 10*3/uL
IMMATURE PLT PERCENT: 6.6 % (ref 1.2–8.6)

## 2021-04-30 MED ORDER — AMLODIPINE BESYLATE 10 MG PO TABS
10 MG | Freq: Every day | ORAL | Status: AC
Start: 2021-04-30 — End: 2021-05-06
  Administered 2021-04-30 – 2021-05-06 (×7): 10 mg via ORAL

## 2021-04-30 MED ORDER — HYDRALAZINE HCL 10 MG PO TABS
10 MG | Freq: Two times a day (BID) | ORAL | Status: AC
Start: 2021-04-30 — End: 2021-05-03
  Administered 2021-05-01 – 2021-05-03 (×6): 10 mg via ORAL

## 2021-04-30 MED ORDER — POLYETHYLENE GLYCOL 3350 17 G PO PACK
17 g | Freq: Once | ORAL | Status: AC
Start: 2021-04-30 — End: 2021-05-06

## 2021-04-30 MED FILL — LISINOPRIL 20 MG PO TABS: 20 MG | ORAL | Qty: 1

## 2021-04-30 MED FILL — ELIQUIS 5 MG PO TABS: 5 MG | ORAL | Qty: 1

## 2021-04-30 MED FILL — ASPIRIN 81 MG PO CHEW: 81 MG | ORAL | Qty: 1

## 2021-04-30 MED FILL — BD POSIFLUSH 0.9 % IV SOLN: 0.9 % | INTRAVENOUS | Qty: 10

## 2021-04-30 MED FILL — MIRALAX 17 G PO PACK: 17 g | ORAL | Qty: 1

## 2021-04-30 MED FILL — FUROSEMIDE 20 MG PO TABS: 20 MG | ORAL | Qty: 1

## 2021-04-30 MED FILL — HYDRALAZINE HCL 25 MG PO TABS: 25 MG | ORAL | Qty: 1

## 2021-04-30 MED FILL — AMLODIPINE BESYLATE 10 MG PO TABS: 10 MG | ORAL | Qty: 1

## 2021-04-30 NOTE — Progress Notes (Signed)
Comprehensive Nutrition Assessment    Type and Reason for Visit:  Initial, Positive Nutrition Screen (Underweight for age BMI)    Nutrition Recommendations/Plan:   Continue Cardiac diet. Will add food preferences.  Monitor need for bowel regimen.     Malnutrition Assessment:  Malnutrition Status:   N/A    Nutrition Assessment:     88 yo F with history of PE on eliquis, HTN, HLD presented to the emergency department at [facility] today due to chest pain. Hypertensive emergency. Negative troponin x 2. EKG without ischemic changes.    1/17-Pt on room air. No BM recorded this admission. Electrolytes WNL. No edema noted.  Nutrition:  1/17-Visited pt at bedside. Pt reported a good appetite with no N/V/D. She ate 75% of her lunch today, but did not eat breakfast. She is unsure of any recent weight loss and endorses difficulty chewing tough meat. No issues swallowing. She is allergic to shellfish, chocolate, strawberries, and peaches. Per I/O's, pt with 26-50% po intake.     Nutrition Related Findings:      Wound Type: None       Current Nutrition Intake & Therapies:    Average Meal Intake: 51-75%, 1-25%  Average Supplements Intake: None Ordered  ADULT DIET; Regular; Low Fat/Low Chol/High Fiber/2 gm Na    Anthropometric Measures:  Height: 5' 6.14" (168 cm)  Ideal Body Weight (IBW): 131 lbs (60 kg)    Admission Body Weight: 123 lb 14.4 oz (56.2 kg)  Current Body Weight: 126 lb 15.8 oz (57.6 kg), 96.9 % IBW.    Current BMI (kg/m2): 20.4  Usual Body Weight: 123 lb 14.4 oz (56.2 kg)  % Weight Change (Calculated): 2.5  Weight Adjustment For: No Adjustment                 BMI Categories: Underweight (BMI less than 22) age over 72    Wt hx:  56.2kg (04/28/21)  57.6kg (04/29/21)    Estimated Daily Nutrient Needs:  Energy Requirements Based On: Kcal/kg  Weight Used for Energy Requirements: Current  Energy (kcal/day): 1728kcal (30kcal/kg)  Weight Used for Protein Requirements: Current  Protein (g/day): 69g (1.2g/kg)  CHO: 216g  (50%EEN)  Method Used for Fluid Requirements: 1 ml/kcal  Fluid (ml/day): (58ml/kcal) or per MD.    Nutrition Diagnosis:   Inadequate protein-energy intake related to other (comment) (food preferences) as evidenced by other (comment) (pt refusing breakfast this morning.)    Nutrition Interventions:   Food and/or Nutrient Delivery: Continue Current Diet             Goals:     Goals: Meet at least 75% of estimated needs, PO intake 75% or greater, other (specify)  Specify Other Goals: Prevent weight loss.    Nutrition Monitoring and Evaluation:    Wt trends, po intake, ONS tolerance, gi function, labs.          Discharge Planning:    Continue current diet     Susa Day, RD  Contact: [phone]

## 2021-04-30 NOTE — Progress Notes (Signed)
.[name]   [address]  [place] [address]  Phone: [phone]  Fax: [phone]  Acute Care Physical Therapy Evaluation    (Link to Caseload Tracking    Acknowledge Orders   Time In/Out   PT Charge Capture   Rehab Caseload Tracker    Therapy Minute Tracking       Allison [name]  [name] is a 88 y.o. female   PRIMARY DIAGNOSIS Hypertensive emergency  Hypertensive emergency [I16.1]       '@DIAGNOSES'$ @    Reason for Referral Generalized Muscle Weakness (M62.81)  Inpatient Payor: MEDICARE / Plan: MEDICARE PART A AND B / Product Type: *No Product type* /     SUBJECTIVE   Pt pleasant and agreeable to PT. Family member present.     Social/Functional Lives With:  (Granddaughter)  Type of Home: House  Home Layout: One level  Home Access:  (one step)  Home Equipment: Rollator, Government social research officer Help From: Family  ADL Assistance: Independent  Homemaking Assistance: Independent  Ambulation Assistance: Independent  Transfer Assistance: [place] / PAIN / OXYGEN LINES / DRAINS / PRECAUTIONS   Vital Signs        Oxygen   Room air     Pain - Pre Treatment   no pain    Pain - Post Treatment   IV and Telemetry     Precautions/Restrictions                    GROSS EVALUATION COMMENTS   AROM Within functional limits   PROM     Strength Generally decreased, functional   Balance  Fair-   Posture     Sensation     Coordination      Tone       COGNITION / PERCEPTION COMMENTS   Orientation     Cognition     Vision     Hearing       MOBILITY LEVEL 1 2 Complete  I Mod I Supervision Setup CGA Min A Mod A Max A Total A Does Not Occur Comments     Roll Left '[]'$  '[]'$  '[]'$  '[]'$  '[]'$  '[]'$  '[]'$  '[]'$  '[]'$  '[]'$  '[]'$  '[]'$       Roll Right '[]'$  '[]'$  '[]'$  '[]'$  '[]'$  '[]'$  '[]'$  '[]'$  '[]'$  '[]'$  '[]'$  '[]'$       Supine to Sit '[]'$  '[]'$  '[]'$  '[]'$  '[]'$  '[]'$  '[]'$  '[x]'$  '[x]'$  '[]'$  '[]'$  '[]'$       Sit to Supine '[]'$  '[]'$  '[]'$  '[]'$  '[]'$  '[]'$  '[]'$  '[]'$  '[]'$  '[]'$  '[]'$  '[]'$       Scooting '[]'$  '[]'$  '[]'$  '[]'$  '[]'$  '[]'$  '[]'$  '[]'$  '[]'$  '[]'$  '[]'$  '[]'$       Edge of Bed Assist '[]'$  '[]'$  '[]'$  '[]'$  '[]'$  '[]'$  '[]'$  '[]'$  '[]'$  '[]'$  '[]'$  '[]'$     Transfer Sit  to Stand '[]'$  '[x]'$  '[]'$  '[]'$  '[]'$  '[]'$  '[]'$  '[x]'$  '[]'$  '[]'$  '[]'$  '[]'$     Transfer Stand to Sit '[]'$  '[]'$  '[]'$  '[]'$  '[]'$  '[]'$  '[x]'$  '[x]'$  '[]'$  '[]'$  '[]'$  '[]'$     Transfer Bed to/from Chair '[]'$  '[]'$  '[]'$  '[]'$  '[]'$  '[]'$  '[]'$  '[]'$  '[]'$  '[]'$  '[]'$  '[]'$     Transfer Toilet '[]'$  '[]'$  '[]'$  '[]'$  '[]'$  '[]'$  '[]'$  '[]'$  '[]'$  '[]'$  '[]'$  '[]'$         AMBULATION LEVEL 1 2 Complete  I Mod I Supervision Setup CGA Min A Mod A Max A Total A Does Not Occur Comments  Level of Assistance '[]'$  '[]'$  '[]'$  '[]'$  '[]'$  '[]'$  '[]'$  '[x]'$  '[]'$  '[]'$  '[]'$  '[]'$       AMBULATION  Distance  12 feet   DME HHA x 2   Gait Quality Decreased cadence , Decreased step length, and Trunk flexion   Stairs        Ambulation Comments        ASSESSMENT      POST ACUTE RECOMMENDATIONS   Recommendation to date pending progress .  Setting Home Health therapy vs SNF; will depend on hospital course. If pt does return home, may need 24/7 assist for safety at least initially.     Justification for Recommended Setting Recommended to help patient obtain maximum functional independence.     Equipment recommend walker for increased support, already owns walker     ASSESSMENT:  Ms. Allison [name] is 89YOBF adm due to chest pain with increased BP. She has h/o unprovoked PE June 2022. Pt lives alone and has help at home but not 24/7 and can be inconsistent according to family member. At baseline, she needs assist with med management and ADLs. She uses cane at home but owns walker. Pt with generalized weakness and deconditioning. She required assist for all tasks today. Skilled PT indicated and will progress as tolerated.      [place]??? ???6 Clicks??? Basic Mobility Inpatient Short Form  AM-PAC Mobility Inpatient   How much difficulty turning over in bed?: A Little  How much difficulty sitting down on / standing up from a chair with arms?: A Little  How much difficulty moving from lying on back to sitting on side of bed?: A Little  How much help from another person moving to and from a bed to a chair?: A Little  How much help from another person needed to walk in hospital room?: A Little  How much  help from another person for climbing 3-5 steps with a railing?: A Little  AM-PAC Inpatient Mobility Raw Score : 18  AM-PAC Inpatient T-Scale Score : 43.63  Mobility Inpatient CMS 0-100% Score: 46.58  Mobility Inpatient CMS G-Code Modifier : CK     FREQUENCY AND DURATION Daily, 6 times/week for duration of hospital stay or until stated goals are met, whichever comes first.    THERAPY PROGNOSIS Good    PROBLEM LIST  (Skilled intervention is medically necessary to address:)  Decreased ADL/Functional Activities  Decreased Activity Tolerance  Decreased Balance  Decreased Gait Ability  Decreased Strength  Decreased Transfer Abilities INTERVENTIONS PLANNED  (Benefits and precautions of physical therapy have been discussed with the patient.)  Therapeutic Activity  Therapeutic Exercise/HEP  Gait Training  Education     Goals      Time Frame for Short Term Goals: 7 days  Short Term Goal 1: supine->sit=mod I  Short Term Goal 2: sit->stand=CGA  Short Term Goal 3: ambulate with walker + CGA x 50'       TREATMENT   EVALUATION LOW COMPLEXITY    TREATMENT GRID N/A    TREATMENT COMMENTS: Pt stood with min A x 2. Noted to be soiled, pt sat back down. She stood again with min A x 2 with assist for clean up. She ambulated with HHA x 2, min A x 2 x 12'. Slow pace. She was positioned for comfort in chair with all needs in reach. Breakfast set up.     SAFETY Alarm activated, Bed/Chair locked, Call light within reach, Chair, Needs within reach, RN notified, and Visitors at bedside  Education Education Given To: Patient  Education Provided: Role of Therapy, Plan of Care  Education Method: Verbal    [employer]

## 2021-04-30 NOTE — Progress Notes (Signed)
[facility] Hospitalist Service     Hospitalist Progress Note     PCP: Pcp No   Admission Date: 04/29/2021    Interval Hx:   Ms. Dabbs continues to have a very labile BP. This AM BP became elevated to SBP>190. She states that she also experienced the same pain that wrapped around her breast that was present on presentation. Following her morning meds, her BP has come down and the pain has resolved. She also reported some dizziness when she tried to have a bowel movement but this is normal for her. Denies recurrence of dizziness, headache, visual changes, sob or chest pain presently. Repeat BP following AM meds 109/60.     Review of Systems     14 point ROS performed and negative apart from interval hx above    Physical Exam   Vitals: BP 109/60    Pulse 59    Temp 97.6 ??F (36.4 ??C) (Oral)    Resp 17    Ht 5' 6" (1.676 m)    Wt 126 lb 15.8 oz (57.6 kg)    SpO2 98%    BMI 20.50 kg/m?? Temp (24hrs), Avg:97.7 ??F (36.5 ??C), Min:97.3 ??F (36.3 ??C), Max:98 ??F (36.7 ??C)    General - Frail, pleasant, elderly female, sitting up in the chair, awake and responsive, NAD   HEENT - NCAT, non-icteric sclera, no subconjunctival pallor, moist mucous membranes   Cardio - RRR, without MRG  Pulm - CTA bilaterally, no wheezing, normal respiratory effort   GI - soft, ND NT with normoactive BS, absent guarding or rebound tenderness  Ext - symmetrical muscle strength bilateral upper and lower extremities, good peripheral pulses bilateral UE / LE, no edema  Skin - no rashes or suspicious lesions observed; no localized erythema   Neuro - no focal weakness, no slurred speech, no facial droop  Psychiatric - appropriate mood and affect    Labs and Imaging   Data: I have personally reviewed all lab results and independently reviewed imaging studies performed in the past 24 hours.    Hematologic/Coags Chemistries   Recent Labs     04/28/21  2023 04/30/21  0601   WBC 6.0 5.8   HGB 12.8 12.2   HCT 36.6 35.9   PLT 183 134*     Lab Results   Component Value  Date/Time    PROT 6.9 04/28/2021 08:23 PM    ALBUMIN 1.30 04/28/2021 08:23 PM     No components found for: HGBA1C  Lab Results   Component Value Date/Time    INR 1.1 10/09/2020 04:23 PM    PROTIME 14.3 10/09/2020 04:23 PM     Lab Results   Component Value Date/Time    APTT 164.9 10/10/2020 03:39 AM     No results found for: DDIMER   Recent Labs     04/28/21  2023 04/30/21  0601   NA 142 141   K 3.9 3.7   CL 103 104   CO2 28 27   BUN 14 16   CREATININE 0.8 0.8   ALBUMIN 1.30  --    BILITOT 0.40  --    ALKPHOS 52  --    AST 23  --    ALT 11  --      No results for input(s): GLU in the last 72 hours.  No results found for: CPK, CKMB, TROPONINI  No results found for: IRON, FERRITIN     Inflammatory/Respiratory Diabetes   No results found for: CRP  No results found for: ESR  ABGs:  No results found for: PHART, PO2ART, HCO3, PCO2ART   Lab Results   Component Value Date/Time    CREATININE 0.8 04/30/2021 06:01 AM              Imaging  CTA CHEST W WO CONTRAST PE Eval    Result Date: 04/28/2021  CTA chest: 04/28/21 INDICATION: PE COMPARISON: June 2022 CT TECHNIQUE: PE protocol (Postcontrast imaging from the thoracic inlet through the  hemidiaphragms in the pulmonary arterial phase. Axial 5x5 mm soft tissue and lung 2x2 mm images. Multiplanar 3-D volumetric MIP reconstructions through the pulmonary arteries per protocol.) CT scanning was performed using radiation dose  reduction techniques when appropriate, per system protocols. FINDINGS: Pulmonary arteries: No evidence of a pulmonary embolus. Lungs/Airways: Diffuse bronchial wall thickening present. There is some mild mucous plugging and associated subsegmental atelectasis noted in the right lower  lobe and to a lesser degree the lingula. No discretely suspicious pulmonary parenchymal nodules. Stable area of subpleural reticulation in the left upper lobe peripherally. Pleura: No pleural effusion or pneumothorax. Lymph Nodes: No mediastinal, hilar, or axillary adenopathy.  Cardiovascular: No aortic aneurysm. No acute aortic pathology. Moderate coronary  artery calcifications. Left ventricular myocardial thickening. Normal heart size. No pericardial effusion. Osseous structures: Somewhat heterogeneous appearance of the bones, similar to the prior study. No discretely suspicious lytic or blastic osseous lesion. Upper Abdomen: No acute findings in the upper abdomen. Mild thickening of the adrenal glands. Moderate amount of stool in the colon.     No pulmonary embolism. Chronic bronchial wall thickening with areas of mucous plugging and associated volume loss again noted primarily in the right lower lobe and lingula. No definite new pulmonary nodules or consolidations appreciated. Continued attention on follow-up recommended.         Medications        amLODIPine  10 mg Oral Daily    polyethylene glycol  17 g Oral Once    hydrALAZINE  10 mg Oral 2 times per day    sodium chloride flush  5-40 mL IntraVENous 2 times per day    apixaban  5 mg Oral BID    aspirin  81 mg Oral Daily    furosemide  20 mg Oral Daily    lisinopril  20 mg Oral Daily      niCARdipine Stopped (04/29/21 0903)    sodium chloride       sodium chloride flush, sodium chloride, ondansetron **OR** ondansetron, polyethylene glycol, aluminum & magnesium hydroxide-simethicone, acetaminophen **OR** acetaminophen, hydrALAZINE      Assessment & Plan     Patient Active Problem List    Diagnosis Date Noted    Hypertensive emergency 04/29/2021     Hypertensive emergency  Attempted to DC drip in ED, however, systolic jumped to 992.  Cardene drip was Dc'd 04/29/21 after she received home medications. BP initially improved but later in the afternoon/evening SBP increased to 190s. This was associated with recurrence in her chest pain. Repeat BP this AM following AM meds now 109/60. Chest pain has resolved. She did have some dizziness while trying to have a bowel movement this AM but this is normal for her. BP remains very labile.    -Continue home lisinopril 20 mg daily, lasix 20 mg daily. Increased amlodipine to 10 mg daily as she was previously on 5 mg daily.   -Given borderline BP will, decrease hydralazine to 10 mg  BID  and follow trend throughout the day.   -  hydralazine 10 mg IV PRN for SBP>180   -Titrate regimen as appropriate based on 24 hour trend      Chest pain  Negative troponin x 2.  EKG without ischemic changes.  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 This is different from her last admission (DNR).  DISPO: continued admission for management of HTN urgency/emergency    Willean Schurman Douglass Rivers, DO  04/30/2021 11:14 AM  [facility] Hospitalist Service

## 2021-04-30 NOTE — Progress Notes (Signed)
[name]  [address]  [place] [address]  Phone: [phone]  Fax: [phone]    Acute Care Occupational Therapy Evaluation    (Link to Caseload Tracking:    Acknowledge Orders   Time In/Out   OT Charge Capture   Rehab Caseload Tracker    Therapy Minute Tracking       Time In: 0837  Time Out: 0856  Minutes: 19     19     MELISA COLGAN is a 88 y.o. female   PRIMARY DIAGNOSIS:Hypertensive emergency  Hypertensive emergency [I16.1]       @DIAGNOSES @     Reason for Referral:Difficulty in walking, Not elsewhere classified (R26.2)  Inpatient Payor: MEDICARE / Plan: MEDICARE PART A AND B / Product Type: *No Product type* /     SUBJECTIVE   Ms. Sharol Harness and RN agreeable to skilled OT, family present for all    Social/Functional Lives With:  (Granddaughter)  Type of Home: House  Home Layout: One level  Home Access:  (one step)  Home Equipment: Rollator, Pension scheme manager Help From: Family  ADL Assistance: Needs assistance  Homemaking Assistance: Independent  Ambulation Assistance: Independent  Transfer Assistance: Independent    OBJECTIVE     VITAL SIGNS / OXYGEN / PAIN LINES / DRAINS / PRECAUTIONS   Vital Signs         Oxygen       Pre Treatment:        Post Treatment: No c/o pain seated in recliner  Telemetry     Precautions/Restrictions  Restrictions/Precautions: Fall Risk                 GROSS EVALUATION COMMENTS   AROM Generally decreased, functional   PROM      Strength Generally decreased, functional   Sensation     Coordination      Tone       COGNITION / PERCEPTION COMMENTS   Orientation     Vision     Hearing     Cognition        Activities of Daily Living   Com I Mod I Sup/  Setup CGA Min A Mod A Max A Total A N/A Comments   Eating []  []  []  []  []  []  []  []  []     Grooming []  []  []  []  []  []  []  []  []     Bathing []  []  []  []  []  []  []  []  []     Upper Body Dressing []  []  []  []  []  []  []  []  []     Lower Body Dressing []  []  []  []  []  []  []  []  []     Toileting []  []  []  []  []  []  []  []  []     Toilet  Transfer []  []  []  []  []  []  []  []  []     Shower Transfer []  []  []  []  []  []  []  []  []       MOBILITY LEVEL 1 2 Com I Mod I Sup/  Setup CGA Min A Mod A Max A Total A N/A Comments     Roll Left []  []  []  []  []  []  []  []  []  []  []       Roll Right []  []  []  []  []  []  []  []  []  []  []       Supine to Sit []  []  []  []  []  []  [x]  []  []  []  []       Sit to Supine []  []  []  []  []  []  []  []  []  []  []       Scooting []  []  []  []  []  []  []  []  []  []  []   Edge of Bed Assist []  []  []  []  []  []  [x]  []  []  []  []     Transfer Sit to Stand []  []  []  []  []  []  [x]  []  []  []  []     Transfer Stand to Sit []  []  []  []  []  []  [x]  []  []  []  []     Transfer Bed to/from Chair []  []  []  []  []  []  [x]  []  []  []  []  Min A x2    Transfer Toilet []  []  []  []  []  []  []  []  []  []  []         ASSESSMENT      POST ACUTE RECOMMENDATIONS   Recommendation to date pending progress:  Setting:  Home Health therapy vs Subacute rehab     Justification for Recommended Setting  Recommended to help patient obtain maximum functional independence.     Equipment:    To Be Determined**     ASSESSMENT:  Ms. Wilkerson seen for skilled OT Evaluation and treatment following ACF admission. Pt presenting with decreased strength, endurance, balance and mobility impairing independence in daily routine. Pt reporting some baseline assist from caregivers and difficulty w/ LB ADL including donning / doffing underwear. Pt benefited from functional transfer training in ACF to increase independence in valued self care ADL. Will progress as tolerated in ACF.     [employer]??? ???6 Clicks??? Basic ADL Inpatient Short Form  How much help for putting on and taking off regular lower body clothing?: A Lot  How much help for Bathing?: A Lot  How much help for Toileting?: A Lot  How much help for putting on and taking off regular upper body clothing?: A Little  How much help for taking care of personal grooming?: A Little  How much help for eating meals?: A Little  AM-PAC Inpatient Daily Activity Raw Score: 15  AM-PAC  Inpatient ADL T-Scale Score : 34.69  ADL Inpatient CMS 0-100% Score: 56.46  ADL Inpatient CMS G-Code Modifier : CK     PLAN   FREQUENCY AND DURATION 3 times/week for duration of hospital stay or until stated goals are met, whichever comes first.    THERAPY PROGNOSISGood  PROBLEM LIST  (Skilled intervention is medically necessary to address)  Performance Deficits  Decreased functional mobility , Decreased ADL status, Decreased strength, Decreased safe awareness, Decreased endurance  INTERVENTIONS PLANNED  (Benefits and precautions of occupational therapy have been discussed with the patient)  Interventions  Strengthening, Balance training, Functional mobility training, Endurance training, Neuromuscular re-education, Safety education & training, Patient/Caregiver education & training, Equipment evaluation, education, & procurement, Self-Care / ADL    Goals  Short Term Goals  Time Frame for Short Term Goals: 2 wks  Short Term Goal 1: Pt will complete all functional transfers consistent CGA w/ LRAD  Short Term Goal 2: Pt will complete all aspects of toileting Min A  Short Term Goal 3: Min A progressive B UE HEP  Short Term Goal 4: Pt will tolerate 3+ min ther act standing SBA close supervision  Long Term Goals  Time Frame for Long Term Goals : 4 wks  Long Term Goal 1: Pt will maximize independence in all valued self care ADL for safe return home     TREATMENT   EVALUATION LOW COMPLEXITY    TREATMENT   Pt seen for skilled OT Evaluation and treatment following ACF adm. Pt pleasant and agreeable for skilled OT, seen w/ PT for initial mobilization. Seated EOB SBA close supervision. Sit to stand Min A x2, Pt noted to be  soiled, Min A x2 static stand w/ assist to peri care, in room mobility Min A x2 handheld across room to recliner. Positioned to comfort. Supportive family present.   TREATMENT GRID     SAFETY Alarm activated, Bed/Chair locked, Call light within reach, Chair, Needs within reach, RN notified, and Visitors at  bedside    INTERDISCIPLINARY COLLABORATION RN/ PCT and PT/ PTA    Education

## 2021-05-01 ENCOUNTER — Inpatient Hospital Stay: Admit: 2021-05-01 | Payer: MEDICARE

## 2021-05-01 LAB — TRANSTHORACIC ECHOCARDIOGRAM (TTE) COMPLETE (CONTRAST/BUBBLE/3D PRN)
AV Area by Peak Velocity: 2.7 cm2
AV Area by VTI: 2.4 cm2
AV Mean Gradient: 3 mmHg
AV Mean Velocity: 0.8 m/s
AV Peak Gradient: 5 mmHg
AV Peak Velocity: 1.1 m/s
AV VTI: 25.7 cm
AV Velocity Ratio: 0.91
AVA/BSA Peak Velocity: 1.6 cm2/m2
AVA/BSA VTI: 1.4 cm2/m2
Ao Root Index: 1.58 cm/m2
Aortic Root: 2.7 cm
Body Surface Area: 1.71 m2
E/E' Lateral: 19
E/E' Ratio (Averaged): 17.1
E/E' Septal: 15.2
Est. RA Pressure: 3 mmHg
Global Longitudinal Strain: -14.8 %
Global Longitudinal Strain: -16.5 %
Global Longitudinal Strain: -16.6 %
Global Longitudinal Strain: -18.4 %
IVSd: 1.1 cm — AB (ref 0.6–0.9)
LA Diameter: 2.3 cm
LA Size Index: 1.35 cm/m2
LA/AO Root Ratio: 0.85
LV E' Lateral Velocity: 4 cm/s
LV E' Septal Velocity: 5 cm/s
LV EDV A4C: 33 mL
LV EDV Index A4C: 19 mL/m2
LV ESV A4C: 11 mL
LV ESV Index A4C: 6 mL/m2
LV Ejection Fraction A4C: 67 %
LV Mass 2D Index: 53 g/m2 (ref 43–95)
LV Mass 2D: 90.7 g (ref 67–162)
LV RWT Ratio: 0.76
LVIDd Index: 1.7 cm/m2
LVIDd: 2.9 cm — AB (ref 3.9–5.3)
LVOT Area: 2.8 cm2
LVOT Diameter: 1.9 cm
LVOT Mean Gradient: 2 mmHg
LVOT Peak Gradient: 4 mmHg
LVOT Peak Velocity: 1 m/s
LVOT SV: 59.5 ml
LVOT Stroke Volume Index: 34.8 mL/m2
LVOT VTI: 21 cm
LVOT:AV VTI Index: 0.82
LVPWd: 1.1 cm — AB (ref 0.6–0.9)
Left Ventricular Ejection Fraction: 58
MV A Velocity: 1.2 m/s
MV E Velocity: 0.76 m/s
MV E Wave Deceleration Time: 343.8 ms
MV E/A: 0.63
RV Free Wall Peak S': 8 cm/s

## 2021-05-01 LAB — CBC WITH AUTO DIFFERENTIAL
Absolute Baso #: 0 10*3/uL (ref 0.0–0.2)
Absolute Eos #: 0.3 10*3/uL (ref 0.0–0.5)
Absolute Lymph #: 2.6 10*3/uL (ref 1.0–3.2)
Absolute Mono #: 0.6 10*3/uL (ref 0.3–1.0)
Basophils %: 0.7 % (ref 0.0–2.0)
Eosinophils %: 5.3 % (ref 0.0–7.0)
Hematocrit: 34.7 % (ref 34.0–47.0)
Hemoglobin: 11.7 g/dL (ref 11.5–15.7)
Immature Grans (Abs): 0.01 10*3/uL (ref 0.00–0.06)
Immature Granulocytes: 0.2 % (ref 0.0–0.6)
Lymphocytes: 46.8 % — ABNORMAL HIGH (ref 15.0–45.0)
MCH: 27.8 pg (ref 27.0–34.5)
MCHC: 33.7 g/dL (ref 32.0–36.0)
MCV: 82.4 fL (ref 81.0–99.0)
MPV: 11.4 fL (ref 7.2–13.2)
Monocytes: 11 % (ref 4.0–12.0)
NRBC Absolute: 0 10*3/uL (ref 0.000–0.012)
NRBC Automated: 0 % (ref 0.0–0.2)
Neutrophils %: 36 % — ABNORMAL LOW (ref 42.0–74.0)
Neutrophils Absolute: 2 10*3/uL (ref 1.6–7.3)
Platelets: 143 10*3/uL (ref 140–440)
RBC: 4.21 x10e6/mcL (ref 3.60–5.20)
RDW: 13.4 % (ref 11.0–16.0)
WBC: 5.5 10*3/uL (ref 3.8–10.6)

## 2021-05-01 LAB — EKG 12-LEAD
P Axis: 63 degrees
P-R Interval: 185 ms
Q-T Interval: 394 ms
QRS Duration: 109 ms
QTc Calculation (Bazett): 414 ms
R Axis: 6 degrees
T Axis: 30 degrees
Ventricular Rate: 66 {beats}/min

## 2021-05-01 LAB — BASIC METABOLIC PANEL W/ REFLEX TO MG FOR LOW K
Anion Gap: 9 mmol/L (ref 2–17)
BUN: 23 mg/dL (ref 8–23)
CO2: 29 mmol/L (ref 22–29)
Calcium: 8.3 mg/dL — ABNORMAL LOW (ref 8.8–10.2)
Chloride: 102 mmol/L (ref 98–107)
Creatinine: 0.9 mg/dL (ref 0.5–1.0)
Est, Glom Filt Rate: 61 mL/min/1.73m?? — ABNORMAL LOW (ref >=90)
Glucose: 84 mg/dL (ref 70–99)
OSMOLALITY CALCULATED: 282 mOsm/kg (ref 270–287)
Potassium: 3.8 mmol/L (ref 3.5–5.3)
Sodium: 140 mmol/L (ref 135–145)

## 2021-05-01 MED ORDER — TUBERCULIN PPD 5 UNIT/0.1ML ID SOLN
5 UNIT/0.1ML | Freq: Once | INTRADERMAL | Status: AC
Start: 2021-05-01 — End: 2021-05-03
  Administered 2021-05-01: 20:00:00 5 [IU] via INTRADERMAL

## 2021-05-01 MED FILL — ELIQUIS 5 MG PO TABS: 5 MG | ORAL | Qty: 1

## 2021-05-01 MED FILL — BD POSIFLUSH 0.9 % IV SOLN: 0.9 % | INTRAVENOUS | Qty: 10

## 2021-05-01 MED FILL — HYDRALAZINE HCL 10 MG PO TABS: 10 MG | ORAL | Qty: 1

## 2021-05-01 NOTE — Progress Notes (Signed)
[name]  [address]  [place] [address]  Phone: [phone]  Fax: [phone]    Acute Care Occupational Therapy Treatment Note  (Link to Caseload Tracking:    Acknowledge Orders   Time In/Out   OT Charge Capture   Rehab Caseload Tracker    Therapy Minute Tracking       Time In: 0820  Time Out: 0858  Minutes: 38     38     Allison [name] is a 88 y.o. female   PRIMARY DIAGNOSIS:Hypertensive emergency  Hypertensive emergency [I16.1]       @DIAGNOSES @    SUBJECTIVE   Ms. Allison [name] and RN agreeable to skilled OT    Social/Functional Lives With:  (Granddaughter)  Type of Home: House  Home Layout: One level  Home Access:  (one step)  Home Equipment: Rollator, Pension scheme manager Help From: Family  ADL Assistance: Needs assistance  Homemaking Assistance: Independent  Ambulation Assistance: Independent  Transfer Assistance: Independent    OBJECTIVE     VITAL SIGNS / OXYGEN / PAIN LINES / DRAINS / PRECAUTIONS   Vital Signs         Oxygen       Pre Treatment:        Post Treatment: No c/o pain  IV, Purewick, and Telemetry     Precautions/Restrictions  Restrictions/Precautions: Fall Risk                 COGNITION / PERCEPTION COMMENTS   Orientation     Cognition        Activities of Daily Living   Complete I Mod I Supervision/Setup CGA Min A Mod A Max A Total A Does Not Occur   Eating []  []  []  []  []  []  []  []  []    Comments    Grooming []  []  []  []  []  []  []  []  []    Comments    Bathing []  []  []  []  []  []  []  []  []    Comments    Upper Body Dressing []  []  []  []  []  []  []  []  []    Comments    Lower Body Dressing []  []  []  []  []  []  []  []  []    Comments    Toileting []  []  []  []  []  []  [x]  []  []    Comments    Toilet Transfer []  []  []  []  [x]  []  []  []  []    Comments    Shower Transfer []  []  []  []  []  []  []  []  []    Comments      Activities of Daily Living Comments        MOBILITY LEVEL 1 2 Complete  I Mod I Supervision/Setup CGA Min A Mod A Max A Total A Does Not Occur   Roll Left []  []  []  []  []  []  []  []  []  []  []    Roll  Right []  []  []  []  []  []  []  []  []  []  []    Supine to Sit []  []  []  []  []  []  [x]  []  []  []  []    Sit to Supine []  []  []  []  []  []  []  []  []  []  []    Scooting []  []  []  []  []  []  [x]  []  []  []  []    Edge of Bed Assist []  []  []  []  []  []  [x]  []  []  []  []    Transfer Sit to Stand []  []  []  []  []  []  [x]  []  []  []  []    Transfer Stand to Sit []  []  []  []  []  []  [x]  []  []  []  []    Transfer Bed to/from  Chair []  []  []  []  []  []  [x]  []  []  []  []    Transfer Toilet []  []  []  []  []  []  []  []  []  []  []      Mobility Comments        ASSESSMENT      POST ACUTE RECOMMENDATIONS   Recommendation to date pending progress:  Setting:  Home Health Therapy, SNF if continued support in home not available     Justification for Recommended Setting  Recommended to help patient obtain maximum functional independence.     Equipment:    To Be Determined     ASSESSMENT:  Allison [name] seen for skilled OT Intervention. Continued functional transfer training to increase safety and independence in daily routine. Will progress as tolerated in ACF.     Allison [name]??? ???6 Clicks??? Basic ADL Inpatient Short Form  How much help for putting on and taking off regular lower body clothing?: A Lot  How much help for Bathing?: A Lot  How much help for Toileting?: A Lot  How much help for putting on and taking off regular upper body clothing?: A Little  How much help for taking care of personal grooming?: A Little  How much help for eating meals?: A Little  AM-PAC Inpatient Daily Activity Raw Score: 15  AM-PAC Inpatient ADL T-Scale Score : 34.69  ADL Inpatient CMS 0-100% Score: 56.46  ADL Inpatient CMS G-Code Modifier : CK    PLAN   FREQUENCY AND DURATION 3 times/week for duration of hospital stay or until stated goals are met, whichever comes first.    THERAPY PROGNOSISGood  PROBLEM LIST  (Skilled intervention is medically necessary to address)  Performance Deficits  Decreased functional mobility , Decreased ADL status, Decreased strength, Decreased safe awareness, Decreased  endurance  INTERVENTIONS PLANNED  (Benefits and precautions of occupational therapy have been discussed with the patient)  Interventions  Strengthening, Balance training, Functional mobility training, Endurance training, Neuromuscular re-education, Safety education & training, Patient/Caregiver education & training, Equipment evaluation, education, & procurement, Self-Care / ADL      GOALS:       Time Frame for Short Term Goals: 2 wks  Short Term Goal 1: Pt will complete all functional transfers consistent CGA w/ LRAD  Short Term Goal 2: Pt will complete all aspects of toileting Min A  Short Term Goal 3: Min A progressive B UE HEP  Short Term Goal 4: Pt will tolerate 3+ min ther act standing SBA close supervision  Time Frame for Long Term Goals : 4 wks  Long Term Goal 1: Pt will maximize independence in all valued self care ADL for safe return home    TREATMENT   TREATMENT Pt seen for skilled OT Intervention. Sup > sit EOB Min A, Pt noted to be soiled. S/U facewashing. Sit to stand Min A to FWW, Min A SPT bed > BSC. Min A doff / Public affairs consultant. Sit to stand Min A, assist to peri care in standing, initiated in room mobility w/ FWW, Pt reporting continued BM, Min A stand > sit BSC. Sit to stand Min A, assist to peri care, in room mobility w/ SC (home AD), Min A -CGA to increase pulmonary endurance for ADL. Transitioned to recliner for morning meal and time OOB. RN updated to all.    TREATMENT GRID     SAFETY Alarm activated, Bed/Chair locked, Call light within reach, Chair, Needs within reach, RN notified, and Visitors at bedside    INTERDISCIPLINARY COLLABORATION:RN/ PCT and PT/ PTA  Education:

## 2021-05-01 NOTE — Progress Notes (Addendum)
Progress Note    Date:05/01/2021       Room:4028/01  Patient Name:Allison [name]     Date of Birth:[birth year]     Age:88 y.o.    Subjective     Denies SOB, chest pain, headache  Medications   Scheduled Meds:    tuberculin  5 Units IntraDERmal Once    amLODIPine  10 mg Oral Daily    polyethylene glycol  17 g Oral Once    hydrALAZINE  10 mg Oral 2 times per day    sodium chloride flush  5-40 mL IntraVENous 2 times per day    apixaban  5 mg Oral BID    aspirin  81 mg Oral Daily    furosemide  20 mg Oral Daily    lisinopril  20 mg Oral Daily     Continuous Infusions:    niCARdipine Stopped (04/29/21 0903)    sodium chloride       PRN Meds: sodium chloride flush, sodium chloride, ondansetron **OR** ondansetron, polyethylene glycol, aluminum & magnesium hydroxide-simethicone, acetaminophen **OR** acetaminophen, hydrALAZINE      Physical Examination      Vitals:  BP (!) 160/97    Pulse 64    Temp 97.4 ??F (36.3 ??C) (Oral)    Resp 18    Ht 5' 6.14" (1.68 m)    Wt 138 lb (62.6 kg)    SpO2 95%    BMI 22.18 kg/m??   Temp (24hrs), Avg:97.9 ??F (36.6 ??C), Min:97.4 ??F (36.3 ??C), Max:98.3 ??F (36.8 ??C)      I/O (24Hr):    Intake/Output Summary (Last 24 hours) at 05/01/2021 1447  Last data filed at 04/30/2021 1652  Gross per 24 hour   Intake --   Output 50 ml   Net -50 ml       ZOX:WRUEA, pleasant woman, Alert and oriented appropriately  HEENT: no JVD  CV: regular rate and rhythm, no murmurs  Pulm: clear to auscultation bilaterally  Abd: soft, nontender  Ext: no edema. Warm and well perfused  Skin: no rashes  Neuro: no focal deficits  Psychiatric: appropriate mood and affect    Labs/Imaging/Diagnostics   Labs:  Recent Results (from the past 24 hour(s))   Basic Metabolic Panel w/ Reflex to MG    Collection Time: 05/01/21  5:24 AM   Result Value Ref Range    Sodium 140 135 - 145 mmol/L    Potassium 3.8 3.5 - 5.3 mmol/L    Chloride 102 98 - 107 mmol/L    CO2 29 22 - 29 mmol/L    Glucose 84 70 - 99 mg/dL    BUN 23 8 - 23 mg/dL    Creatinine  0.9 0.5 - 1.0 mg/dL    Anion Gap 9 2 - 17 mmol/L    OSMOLALITY CALCULATED 282 270 - 287 mOsm/kg    Calcium 8.3 (L) 8.8 - 10.2 mg/dL    Est, Glom Filt Rate 61 (L) >=90 mL/min/1.25m??   CBC with Auto Differential    Collection Time: 05/01/21  5:24 AM   Result Value Ref Range    WBC 5.5 3.8 - 10.6 x10e3/mcL    RBC 4.21 3.60 - 5.20 x10e6/mcL    Hemoglobin 11.7 11.5 - 15.7 g/dL    Hematocrit 54.0 98.1 - 47.0 %    MCV 82.4 81.0 - 99.0 fL    MCH 27.8 27.0 - 34.5 pg    MCHC 33.7 32.0 - 36.0 g/dL    RDW 19.1 47.8 -  16.0 %    Platelets 143 140 - 440 x10e3/mcL    MPV 11.4 7.2 - 13.2 fL    NRBC Automated 0.0 0.0 - 0.2 %    NRBC Absolute 0.000 0.000 - 0.012 x10e3/mcL    Neutrophils % 36.0 (L) 42.0 - 74.0 %    Lymphocytes 46.8 (H) 15.0 - 45.0 %    Monocytes 11.0 4.0 - 12.0 %    Eosinophils % 5.3 0.0 - 7.0 %    Basophils % 0.7 0.0 - 2.0 %    Neutrophils Absolute 2.0 1.6 - 7.3 x10e3/mcL    Absolute Lymph # 2.6 1.0 - 3.2 x10e3/mcL    Absolute Mono # 0.6 0.3 - 1.0 x10e3/mcL    Absolute Eos # 0.3 0.0 - 0.5 x10e3/mcL    Absolute Baso # 0.0 0.0 - 0.2 x10e3/mcL    Immature Granulocytes 0.2 0.0 - 0.6 %    Immature Grans (Abs) 0.01 0.00 - 0.06 x10e3/mcL   Transthoracic echocardiogram (TTE) complete with contrast, bubble, strain, and 3D PRN    Collection Time: 05/01/21 11:27 AM   Result Value Ref Range    IVSd 1.1 (A) 0.6 - 0.9 cm    LVIDd 2.9 (A) 3.9 - 5.3 cm    LVOT Diameter 1.9 cm    LVPWd 1.1 (A) 0.6 - 0.9 cm    Global Longitudinal Strain -16.5 %    Global Longitudinal Strain -14.8 %    Global Longitudinal Strain -18.4 %    Global Longitudinal Strain -16.6 %    LV Ejection Fraction A4C 67 %    LV EDV A4C 33 mL    LV ESV A4C 11 mL    LVOT Peak Gradient 4 mmHg    LVOT Mean Gradient 2 mmHg    LVOT Peak Velocity 1.0 m/s    LVOT VTI 21.0 cm    RV Free Wall Peak S' 8 cm/s    LA Diameter 2.3 cm    AV Area by Peak Velocity 2.7 cm2    AV Area by VTI 2.4 cm2    AV Peak Gradient 5 mmHg    AV Mean Gradient 3 mmHg    AV Peak Velocity 1.1 m/s    AV  Mean Velocity 0.8 m/s    AV VTI 25.7 cm    MV A Velocity 1.20 m/s    MV E Wave Deceleration Time 343.8 ms    MV E Velocity 0.76 m/s    LV E' Lateral Velocity 4 cm/s    LV E' Septal Velocity 5 cm/s    Aortic Root 2.7 cm    Body Surface Area 1.71 m2    LV ESV Index A4C 6 mL/m2    LV EDV Index A4C 19 mL/m2    LVIDd Index 1.70 cm/m2    LV RWT Ratio 0.76     LV Mass 2D 90.7 67 - 162 g    LV Mass 2D Index 53.0 43 - 95 g/m2    MV E/A 0.63     E/E' Ratio (Averaged) 17.10     E/E' Lateral 19.00     E/E' Septal 15.20     LVOT SV 59.5 ml    LVOT Stroke Volume Index 34.8 mL/m2    LVOT Area 2.8 cm2    LA Size Index 1.35 cm/m2    LA/AO Root Ratio 0.85     Ao Root Index 1.58 cm/m2    AV Velocity Ratio 0.91     LVOT:AV VTI Index 0.82  AVA/BSA VTI 1.4 cm2/m2    AVA/BSA Peak Velocity 1.6 cm2/m2    Est. RA Pressure 3 mmHg        Imaging Last 24 Hours:  No results found.      Assessment/Plan:        Hospital Problems             Last Modified POA    * (Principal) Hypertensive emergency 04/29/2021 Yes   Hypertensive emergency  Cardene gtt dropped Bps too quickly on admission.      -Continue home lisinopril 20 mg daily, lasix 20 mg daily. Increased amlodipine to 10 mg daily as she was previously on 5 mg daily.   -Given borderline BP will, decrease hydralazine to 10 mg  BID  and follow trend throughout the day.   -hydralazine 10 mg IV PRN for SBP>180   -BP remains very labile.  Titrate regimen as appropriate based on 24 hour trend    Check ECHO    Chest pain  Negative troponin x 2.  EKG without ischemic changes.  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    Code status: Full Code . This is different from her last admission (DNR).  DISPO: continued admission for management of HTN urgency/emergency  PT rec SNF.  Spoke with patient and family at bedside. She is amenable to SNF  but hesitant.. PPD placed.        Electronically signed by Charlestine Massedima Pravin Donta Mcinroy, MD on 05/01/21 at 2:47 PM EST

## 2021-05-01 NOTE — Progress Notes (Signed)
[name]  [address]  [place] [address]  Phone: [phone]  Fax: [phone]  Acute Care Physical Therapy Treatment Note    (Link to Caseload Tracking PT Visit Days : 2  Acknowledge Orders   Time In/Out   PT Charge Capture   Rehab Caseload Tracker    Therapy Minute Tracking        [phone]  32      Allison [name] is a 88 y.o. female   PRIMARY DIAGNOSIS Hypertensive emergency  Hypertensive emergency [I16.1]       @DIAGNOSES @    SUBJECTIVE   Family member present. Increased time to wake pt.     Social/Functional Lives With:  (Granddaughter)  Type of Home: House  Home Layout: One level  Home Access:  (one step)  Home Equipment: Rollator, Help From: Family  ADL Assistance: Needs assistance  Homemaking Assistance: Independent  Ambulation Assistance: Independent  Transfer Assistance: Independent    OBJECTIVE     VITAL SIGNS / PAIN / OXYGEN LINES / DRAINS / PRECAUTIONS   Vital Signs        Oxygen        Pain - Pre Treatment  painful constipation    Pain - Post Treatment   IV and Telemetry     Precautions/Restrictions  Restrictions/Precautions: Fall Risk                 GROSS EVALUATION COMMENTS   AROM Within functional limits   PROM     Strength Generally decreased, functional   Balance     Posture     Sensation     Coordination      Tone       COGNITION / PERCEPTION COMMENTS   Orientation     Cognition     Vision     Hearing       MOBILITY LEVEL 1 2 Complete  I Mod I Supervision Setup CGA Min A Mod A Max A Total A Does Not Occur Comments     Roll Left []  []  []  []  []  []  []  []  []  []  []  []       Roll Right []  []  []  []  []  []  []  []  []  []  []  []       Supine to Sit []  []  []  []  []  []  []  []  []  []  []  []       Sit to Supine []  []  []  []  []  []  []  []  []  []  []  []       Scooting []  []  []  []  []  []  []  []  []  []  []  []       Edge of Bed Assist []  []  []  []  []  []  []  []  []  []  []  []     Transfer Sit to Stand []  []  []  []  []  []  []  []  []  []  []  []     Transfer Stand to Sit []  []  []  []  []  []  []  []  []  []  []   []     Transfer Bed to/from Chair []  []  []  []  []  []  []  []  []  []  []  []     Transfer Toilet []  []  []  []  []  []  []  []  []  []  []  []         AMBULATION LEVEL 1 2 Complete  I Mod I Supervision Setup CGA Min A Mod A Max A Total A Does Not Occur Comments   Level of Assistance []  []  []  []  []  []  []  []  []  []  []        AMBULATION  Distance  feet   DME    Gait Quality    Stairs        Ambulation Comments        BALANCE Good Fair+ Fair Fair- Poor NT Comments   Sitting Static []  []  []  []  []  []     Sitting Dynamic []  []  []  []  []  []               Standing Static []  []  []  []  []  []     Standing Dynamic []  []  []  []  []  []       ASSESSMENT      POST ACUTE RECOMMENDATIONS   Recommendation to date pending progress   Setting Skilled Nursing Facility (SNF) vs HHPT and 24/7 support; not safe for pt to home unless she has 24/7 assist    Justification for Recommended Setting Recommended to help patient obtain maximum functional independence.     Equipment already owns rollator walker and cane     ASSESSMENT:  Supine->sit=min A with increased time to complete. She stood with CGA/min and pivoted to commode with cueing for sequencing. Sat on toilet for BM, gown changed while seated. She stood with min A, assist for clean up. She took few steps and stated she needed to sit back down again for BM (constipated). Pt sat back down on toilet with assist. She was able to use restroom a little more, assist for clean up. She ambulated in room x 45' in room with cane and therapist assist on the other side (min A). Cueing for processing info and sequencing. Pt sat in chair with purewick replaced and all needs in reach. Family member present.Able to increase activity today but was limited overall with painful constipation. Nursing aware. Progress as tolerated.      Goals      Time Frame for Short Term Goals: 7 days  Short Term Goal 1: supine->sit=mod I  Short Term Goal 2: sit->stand=CGA  Short Term Goal 3: ambulate with walker + CGA x 50'       SAFETY Alarm activated,  Bed/Chair locked, Call light within reach, Chair, Needs within reach, RN notified, and Visitors at bedside    Education Education Given To: Patient  Education Provided: Role of Therapy, Plan of Care  Education Method: Verbal    

## 2021-05-01 NOTE — Care Coordination-Inpatient (Signed)
From chart review patient not medically ready for discharge. Home Health therapy vs SNF  depending on hospital course. Will continue to follow.

## 2021-05-02 LAB — BASIC METABOLIC PANEL
Anion Gap: 7 mmol/L (ref 2–17)
BUN: 19 mg/dL (ref 8–23)
CO2: 29 mmol/L (ref 22–29)
Calcium: 8.6 mg/dL — ABNORMAL LOW (ref 8.8–10.2)
Chloride: 103 mmol/L (ref 98–107)
Creatinine: 0.8 mg/dL (ref 0.5–1.0)
Est, Glom Filt Rate: 70 mL/min/1.73m?? — ABNORMAL LOW (ref >=90)
Glucose: 86 mg/dL (ref 70–99)
OSMOLALITY CALCULATED: 279 mOsm/kg (ref 270–287)
Potassium: 3.7 mmol/L (ref 3.5–5.3)
Sodium: 139 mmol/L (ref 135–145)

## 2021-05-02 LAB — CBC
Hematocrit: 36.1 % (ref 34.0–47.0)
Hemoglobin: 13 g/dL (ref 11.5–15.7)
MCH: 28.3 pg (ref 27.0–34.5)
MCHC: 36 g/dL (ref 32.0–36.0)
MCV: 78.6 fL — ABNORMAL LOW (ref 81.0–99.0)
MPV: 10.7 fL (ref 7.2–13.2)
NRBC Absolute: 0 10*3/uL (ref 0.000–0.012)
NRBC Automated: 0 % (ref 0.0–0.2)
Platelets: 130 10*3/uL — ABNORMAL LOW (ref 140–440)
RBC: 4.59 x10e6/mcL (ref 3.60–5.20)
RDW: 13.4 % (ref 11.0–16.0)
WBC: 5.7 10*3/uL (ref 3.8–10.6)

## 2021-05-02 LAB — IMMATURE CELLS
IMMATURE PLT ABSOLUTE: 6.2 10*3/uL
IMMATURE PLT PERCENT: 4.8 % (ref 1.2–8.6)

## 2021-05-02 MED ORDER — MECLIZINE HCL 25 MG PO TABS
25 MG | Freq: Three times a day (TID) | ORAL | Status: DC | PRN
Start: 2021-05-02 — End: 2021-05-06
  Administered 2021-05-02: 18:00:00 12.5 mg via ORAL

## 2021-05-02 MED ORDER — LISINOPRIL 20 MG PO TABS
20 MG | Freq: Every day | ORAL | Status: AC
Start: 2021-05-02 — End: 2021-05-06
  Administered 2021-05-02 – 2021-05-06 (×5): 40 mg via ORAL

## 2021-05-02 MED FILL — ASPIRIN 81 MG PO CHEW: 81 MG | ORAL | Qty: 1

## 2021-05-02 MED FILL — FUROSEMIDE 20 MG PO TABS: 20 MG | ORAL | Qty: 1

## 2021-05-02 MED FILL — LISINOPRIL 20 MG PO TABS: 20 MG | ORAL | Qty: 2

## 2021-05-02 MED FILL — MECLIZINE HCL 25 MG PO TABS: 25 MG | ORAL | Qty: 1

## 2021-05-02 MED FILL — ELIQUIS 5 MG PO TABS: 5 MG | ORAL | Qty: 1

## 2021-05-02 MED FILL — LISINOPRIL 20 MG PO TABS: 20 MG | ORAL | Qty: 1

## 2021-05-02 MED FILL — AMLODIPINE BESYLATE 10 MG PO TABS: 10 MG | ORAL | Qty: 1

## 2021-05-02 MED FILL — BD POSIFLUSH 0.9 % IV SOLN: 0.9 % | INTRAVENOUS | Qty: 10

## 2021-05-02 MED FILL — TYLENOL 325 MG PO TABS: 325 MG | ORAL | Qty: 2

## 2021-05-02 MED FILL — HYDRALAZINE HCL 10 MG PO TABS: 10 MG | ORAL | Qty: 1

## 2021-05-02 NOTE — Progress Notes (Signed)
Progress Note    Date:05/02/2021       Room:4028/01  Patient Name:Allison [name]     Date of Birth:08/26/1928     Age:88 y.o.    Subjective   No events overnight. Patient denies chest pain. Just feels weak    Medications   Scheduled Meds:    lisinopril  40 mg Oral Daily    tuberculin  5 Units IntraDERmal Once    amLODIPine  10 mg Oral Daily    polyethylene glycol  17 g Oral Once    hydrALAZINE  10 mg Oral 2 times per day    sodium chloride flush  5-40 mL IntraVENous 2 times per day    apixaban  5 mg Oral BID    aspirin  81 mg Oral Daily    furosemide  20 mg Oral Daily     Continuous Infusions:    niCARdipine Stopped (04/29/21 0903)    sodium chloride       PRN Meds: sodium chloride flush, sodium chloride, ondansetron **OR** ondansetron, polyethylene glycol, aluminum & magnesium hydroxide-simethicone, acetaminophen **OR** acetaminophen, hydrALAZINE      Physical Examination      Vitals:  BP (!) 175/80    Pulse 56    Temp 97.6 ??F (36.4 ??C) (Oral)    Resp 16    Ht 5' 6.14" (1.68 m)    Wt 136 lb 11 oz (62 kg)    SpO2 98%    BMI 21.97 kg/m??   Temp (24hrs), Avg:97.8 ??F (36.6 ??C), Min:97.4 ??F (36.3 ??C), Max:98.4 ??F (36.9 ??C)      I/O (24Hr):  No intake or output data in the 24 hours ending 05/02/21 0854    AVW:UJWJXGen:frail, pleasant woman, Alert and oriented appropriately  HEENT: no JVD  CV: regular rate and rhythm, no murmurs  Pulm: clear to auscultation bilaterally  Abd: soft, nontender  Ext: no edema. Warm and well perfused  Skin: no rashes  Neuro: no focal deficits  Psychiatric: appropriate mood and affect  Labs:  Recent Results (from the past 24 hour(s))   Transthoracic echocardiogram (TTE) complete with contrast, bubble, strain, and 3D PRN    Collection Time: 05/01/21 11:27 AM   Result Value Ref Range    IVSd 1.1 (A) 0.6 - 0.9 cm    LVIDd 2.9 (A) 3.9 - 5.3 cm    LVOT Diameter 1.9 cm    LVPWd 1.1 (A) 0.6 - 0.9 cm    Global Longitudinal Strain -16.5 %    Global Longitudinal Strain -14.8 %    Global Longitudinal Strain -18.4 %     Global Longitudinal Strain -16.6 %    LV Ejection Fraction A4C 67 %    LV EDV A4C 33 mL    LV ESV A4C 11 mL    LVOT Peak Gradient 4 mmHg    LVOT Mean Gradient 2 mmHg    LVOT Peak Velocity 1.0 m/s    LVOT VTI 21.0 cm    RV Free Wall Peak S' 8 cm/s    LA Diameter 2.3 cm    AV Area by Peak Velocity 2.7 cm2    AV Area by VTI 2.4 cm2    AV Peak Gradient 5 mmHg    AV Mean Gradient 3 mmHg    AV Peak Velocity 1.1 m/s    AV Mean Velocity 0.8 m/s    AV VTI 25.7 cm    MV A Velocity 1.20 m/s    MV E Wave Deceleration Time 343.8  ms    MV E Velocity 0.76 m/s    LV E' Lateral Velocity 4 cm/s    LV E' Septal Velocity 5 cm/s    Aortic Root 2.7 cm    Body Surface Area 1.71 m2    LV ESV Index A4C 6 mL/m2    LV EDV Index A4C 19 mL/m2    LVIDd Index 1.70 cm/m2    LV RWT Ratio 0.76     LV Mass 2D 90.7 67 - 162 g    LV Mass 2D Index 53.0 43 - 95 g/m2    MV E/A 0.63     E/E' Ratio (Averaged) 17.10     E/E' Lateral 19.00     E/E' Septal 15.20     LVOT SV 59.5 ml    LVOT Stroke Volume Index 34.8 mL/m2    LVOT Area 2.8 cm2    LA Size Index 1.35 cm/m2    LA/AO Root Ratio 0.85     Ao Root Index 1.58 cm/m2    AV Velocity Ratio 0.91     LVOT:AV VTI Index 0.82     AVA/BSA VTI 1.4 cm2/m2    AVA/BSA Peak Velocity 1.6 cm2/m2    Est. RA Pressure 3 mmHg   Basic Metabolic Panel    Collection Time: 05/02/21  6:07 AM   Result Value Ref Range    Sodium 139 135 - 145 mmol/L    Potassium 3.7 3.5 - 5.3 mmol/L    Chloride 103 98 - 107 mmol/L    CO2 29 22 - 29 mmol/L    Glucose 86 70 - 99 mg/dL    BUN 19 8 - 23 mg/dL    Creatinine 0.8 0.5 - 1.0 mg/dL    Anion Gap 7 2 - 17 mmol/L    OSMOLALITY CALCULATED 279 270 - 287 mOsm/kg    Calcium 8.6 (L) 8.8 - 10.2 mg/dL    Est, Glom Filt Rate 70 (L) >=90 mL/min/1.34m??   CBC    Collection Time: 05/02/21  6:07 AM   Result Value Ref Range    WBC 5.7 3.8 - 10.6 x10e3/mcL    RBC 4.59 3.60 - 5.20 x10e6/mcL    Hemoglobin 13.0 11.5 - 15.7 g/dL    Hematocrit 50.0 37.0 - 47.0 %    MCV 78.6 (L) 81.0 - 99.0 fL    MCH 28.3 27.0 -  34.5 pg    MCHC 36.0 32.0 - 36.0 g/dL    RDW 48.8 89.1 - 69.4 %    Platelets 130 (L) 140 - 440 x10e3/mcL    MPV 10.7 7.2 - 13.2 fL    NRBC Automated 0.0 0.0 - 0.2 %    NRBC Absolute 0.000 0.000 - 0.012 x10e3/mcL   Immature Platelet Fraction    Collection Time: 05/02/21  6:07 AM   Result Value Ref Range    IMMATURE PLT ABSOLUTE 6.2 x10e3/mcL    IMMATURE PLT PERCENT 4.8 1.2 - 8.6 %        Imaging Last 24 Hours:  Transthoracic echocardiogram (TTE) complete with contrast, bubble, strain, and 3D PRN    Result Date: 05/01/2021    Left Ventricle: Normal left ventricular systolic function with a visually estimated EF of 55 - 60%. Left ventricle size is normal. Normal wall thickness. Grossly normal wall motion. Abnormal diastolic function.   Aortic Valve: Valve structure is normal. Mild sclerosis of the aortic valve cusp.   Mitral Valve: Valve structure is normal. Mild annular calcification of the mitral valve.  Technical qualifiers: Technically difficult study due to low parasternal window.         Assessment/Plan:        Hospital Problems             Last Modified POA    * (Principal) Hypertensive emergency 04/29/2021 Yes     Hypertensive emergency  Cardene gtt dropped Bps too quickly on admission.      -Currently on  home lisinopril 20 mg daily, lasix 20 mg daily, amlodipine increased to 10 mg a couple of days ago as she was previously on 5 mg daily. hydralazine to 10 mg  BID    -hydralazine 10 mg IV PRN for SBP>180   -BP remains very labile.  Titrate regimen as appropriate based on 24 hour trend   Will increase Lisinopril to 40 today and watch closely.   ECHO yesterday showed nl EF with abnormal DD     Chest pain  Negative troponin x 2.  EKG without ischemic changes.  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    Code status: Full Code . This is different from her last admission (DNR).  DISPO: Patient should be medically  stable for dc in next 24 hours. CM to discuss SNF choices today  .  Spoke with patient and family at bedside. She is amenable to SNF but hesitant.. PPD placed 1/18    Electronically signed by Charlestine Massed, MD on 05/02/21 at 8:54 AM EST

## 2021-05-02 NOTE — Care Coordination-Inpatient (Signed)
Spoke with Sande Brothers POA regarding patients dispo. She states she will be here today to give me selections for SNIFs. We also discussed plan for when patient is discharged from [facility] because some facilities require prior to acceptance and she said she is working on it. I expressed the importance of getting these matters sorted out because discharge is imminent. Will continue to follow.

## 2021-05-02 NOTE — Progress Notes (Signed)
[facility]  [address]  [place] [address]  Phone: [phone]  Fax: [phone]    Acute Care Occupational Therapy Treatment Note  (Link to Caseload Tracking: PT Visit Days : 2  Acknowledge Orders  Time In/Out  OT Charge Capture  Rehab Caseload Tracker    Therapy Minute Tracking       Time In: 0820  Time Out: 0900  Minutes: 40     40     TARAHJI RAMTHUN is a 88 y.o. female   PRIMARY DIAGNOSIS:Hypertensive emergency  Hypertensive emergency [I16.1]       _0 @    SUBJECTIVE   Ms. Rosita Fire and RN agreeable to skilled OT    Social/Functional Lives With:  (Granddaughter)  Type of Home: House  Home Layout: One level  Home Access:  (one step)  Home Equipment: Rollator, Government social research officer Help From: Family  ADL Assistance: Needs assistance  Homemaking Assistance: Independent  Ambulation Assistance: Independent  Transfer Assistance: [place] / OXYGEN / PAIN LINES / DRAINS / PRECAUTIONS   Vital Signs         Oxygen       Pre Treatment:        Post Treatment: No c/o pain  Continuous Pulse Oximetry, Purewick, and Telemetry     Precautions/Restrictions  Restrictions/Precautions: Fall Risk                 COGNITION / PERCEPTION COMMENTS   Orientation     Cognition        Activities of Daily Living   Complete I Mod I Supervision/Setup CGA Min A Mod A Max A Total A Does Not Occur   Eating _1  _2  _3  _4  _5  _6  _7  _8  _9    Comments    Grooming _10  _11  _12  _13  _14  _15  _16  _17  _18    Comments    Bathing _19  _20  _21  _22  _23  _24  _25  _26  _27    Comments    Upper Body Dressing _28  _29  _30  _31  _32  _33  _34  _35  _36    Comments    Lower Body Dressing _37  _38  _39  _40  _41  _42  _43  _44  _45    Comments    Toileting _46  _47  _48  _49  _50  _51  _52  _53  _54    Comments    Toilet Transfer _55  _56  _57  _58  _59  _60  _61  _62  _63    Comments    Shower Transfer _64  _65  _66  _67  _68  _69  _70  _71  _72    Comments      Activities of Daily Living Comments        MOBILITY LEVEL 1 2 Complete  I Mod I Supervision/Setup CGA Min A Mod A Max A Total A Does Not Occur   Roll Left _73  _74   _75  _76  _77  _78  _79  _80  _81  _82  _83    Roll Right _84  _85  _86  _87  _88  _89  _90  _91  _92  _93  _94    Supine to Sit _95  _96  _97  _98  _99  _100  _101  _102  _103  _104  _105    Sit to Supine _106  _107  _108  _109  _110  _111  _112  _113  _114  _115  _116    Scooting _117  _118  _119  _120  _121  _122  _123  _124  _125  _126  _127    Edge of Bed Assist _128  _129  _130  _131  _132  _133  _134  _135  _136  _137  _138    Transfer Sit to Stand _139  _140  _141  _142  _143  _144  _145  _146  _147  _148  _149    Transfer Stand to Sit _150  _151  _152  _153  _154  _155  _156  _157  _158  _159  _160    Transfer  Bed to/from Chair _0  _1  _2  _3  _4  _5  _6  _7  _8  _9  _10    Transfer Toilet _11  _12  _13  _14  _15  _16  _17  _18  _19  _20  _21      Mobility Comments        ASSESSMENT      POST ACUTE RECOMMENDATIONS   Recommendation to date pending progress:  Setting:  SNF vs HH    Justification for Recommended Setting  Recommended to help patient obtain maximum functional independence.     Equipment:    To Be Determined     ASSESSMENT:  Ms. Hosking seen for skilled OT Intervention, continued functional transfer training in ACF to increase safety during caregiver directed ADL. Will progress as tolerated in ACF and recc post acute services at d/c.     [facility]	[place] AM-PACT "6 Clicks" Basic ADL Inpatient Short Form  How much help for putting on and taking off regular lower body clothing?: A Lot  How much help for Bathing?: A Lot  How much help for Toileting?: A Lot  How much help for putting on and taking off regular upper body clothing?: A Little  How much help for taking care of personal grooming?: A Little  How much help for eating meals?: A Little  AM-PAC Inpatient Daily Activity Raw Score: 15  AM-PAC Inpatient ADL T-Scale Score : 34.69  ADL Inpatient CMS 0-100% Score: 56.46  ADL Inpatient CMS G-Code Modifier : CK    PLAN   FREQUENCY AND DURATION 3 times/week for duration of hospital stay or until stated goals are met, whichever comes first.    THERAPY PROGNOSISGood  PROBLEM LIST  (Skilled intervention is medically necessary to address)  Performance Deficits  Decreased functional mobility , Decreased ADL status, Decreased strength, Decreased safe awareness,  Decreased endurance  INTERVENTIONS PLANNED  (Benefits and precautions of occupational therapy have been discussed with the patient)  Interventions  Strengthening, Balance training, Functional mobility training, Endurance training, Neuromuscular re-education, Safety education & training, Patient/Caregiver education & training, Equipment evaluation, education, & procurement, Self-Care / ADL      GOALS:       Time Frame for Short Term Goals: 2 wks  Short Term Goal 1: Pt will complete all functional transfers consistent CGA w/ LRAD  Short Term Goal 2: Pt will complete all aspects of toileting Min A  Short Term Goal 3: Min A progressive B UE HEP  Short Term Goal 4: Pt will tolerate 3+ min ther act standing SBA close supervision  Time Frame for Long Term Goals : 4 wks  Long Term Goal 1: Pt will maximize independence in all valued self care ADL for safe return home    TREATMENT   TREATMENT Pt seen for skilled OT Intervention. Sup > sit EOB Min A, sit to stand Min A, Min A x2 SPT bed > BSC. S/U peri care seated, sit to stand Min A x2, in room mobility Min A w/ Pt reporting needing shoes, stand > sit chair. S/U don slip on shoes, unable to don, Max A don shoes. Sit to stand Min A x2, in room, unit mobility Min A-CGA w/ SC to increase functional activity tolerance for ADL mgmt. Transitioned to recliner to increase time OOB, morning meal S/U.    TREATMENT GRID     SAFETY Alarm activated, Bed/Chair locked, Call light within reach, Chair, Heels floated, Needs within reach, and RN notified    INTERDISCIPLINARY COLLABORATION:RN/ PCT and PT/ PTA    Education:

## 2021-05-02 NOTE — Progress Notes (Signed)
[name]  [address]  [place] [address]  Phone: [phone]  Fax: [phone]  Acute Care Physical Therapy Treatment Note    (Link to Caseload Tracking PT Visit Days : 2  Acknowledge Orders   Time In/Out   PT Charge Capture   Rehab Caseload Tracker    Therapy Minute Tracking        [phone]  32      Allison [name] is a 88 y.o. female   PRIMARY DIAGNOSIS Hypertensive emergency  Hypertensive emergency [I16.1]       @DIAGNOSES @    SUBJECTIVE   Pt pleasant and agreeable to PT.     Social/Functional Lives With:  (Granddaughter)  Type of Home: House  Home Layout: One level  Home Access:  (one step)  Home Equipment: Rollator, Help From: Family  ADL Assistance: Needs assistance  Homemaking Assistance: Independent  Ambulation Assistance: Independent  Transfer Assistance: Independent    OBJECTIVE     VITAL SIGNS / PAIN / OXYGEN LINES / DRAINS / PRECAUTIONS   Vital Signs        Oxygen room air        Pain - Pre Treatment      Pain - Post Treatment no pain   IV, Purewick, and Telemetry     Precautions/Restrictions  Restrictions/Precautions: Fall Risk                 GROSS EVALUATION COMMENTS   AROM Within functional limits   PROM     Strength Generally decreased, functional   Balance     Posture     Sensation     Coordination      Tone       COGNITION / PERCEPTION COMMENTS   Orientation     Cognition     Vision     Hearing       MOBILITY LEVEL 1 2 Complete  I Mod I Supervision Setup CGA Min A Mod A Max A Total A Does Not Occur Comments     Roll Left []  []  []  []  []  []  []  []  []  []  []  []       Roll Right []  []  []  []  []  []  []  []  []  []  []  []       Supine to Sit []  []  []  []  []  []  []  []  []  []  []  []       Sit to Supine []  []  []  []  []  []  []  []  []  []  []  []       Scooting []  []  []  []  []  []  []  []  []  []  []  []       Edge of Bed Assist []  []  []  []  []  []  []  []  []  []  []  []     Transfer Sit to Stand []  []  []  []  []  []  []  []  []  []  []  []     Transfer Stand to Sit []  []  []  []  []  []  []  []  []  []  []  []      Transfer Bed to/from Chair []  []  []  []  []  []  []  []  []  []  []  []     Transfer Toilet []  []  []  []  []  []  []  []  []  []  []  []         AMBULATION LEVEL 1 2 Complete  I Mod I Supervision Setup CGA Min A Mod A Max A Total A Does Not Occur Comments   Level of Assistance []  []  []  []  []  []  []  []  []  []  []        AMBULATION  Distance 160  feet   DME SPC   Gait Quality Decreased step length and Trunk flexion   Stairs        Ambulation Comments        BALANCE Good Fair+ Fair Fair- Poor NT Comments   Sitting Static []  []  []  []  []  []     Sitting Dynamic []  []  []  []  []  []               Standing Static []  []  []  []  []  []     Standing Dynamic []  []  []  []  []  []       ASSESSMENT      POST ACUTE RECOMMENDATIONS   Recommendation to date pending progress   Setting Skilled Nursing Facility (SNF) vs home with HHPT and 24/7 support    Justification for Recommended Setting Recommended to help patient obtain maximum functional independence.     Equipment has cane and rollator walker at home     ASSESSMENT:  Supine->sit=mod I. Sit->stand and transferred to commode=min A, assist for clean up. Pt ambulated with cane + CGA/min A x 160'. Shorter steps. Appeared to be in good spirits. Pt returned to chair with all needs in reach. Purewick replaced. Breakfast set up. Increased activity today; able to increase gait distance in halls.Concerned about her being home alone at times-unclear how long she is left alone during day.     Goals      Time Frame for Short Term Goals: 7 days  Short Term Goal 1: supine->sit=mod I  Short Term Goal 2: sit->stand=CGA  Short Term Goal 3: ambulate with walker + CGA x 50'       SAFETY Alarm activated, Bed/Chair locked, Call light within reach, Chair, Needs within reach, and RN notified    Education Education Given To: Patient  Education Provided: Role of Therapy, Plan of Care  Education Method: Verbal    

## 2021-05-03 LAB — PLEASE READ & DOCUMENT PPD TEST IN 48 HRS
PPD, (POC): NEGATIVE
mm Induration: 0 mm (ref 0–5)

## 2021-05-03 LAB — BASIC METABOLIC PANEL
Anion Gap: 8 mmol/L (ref 2–17)
BUN: 21 mg/dL (ref 8–23)
CO2: 28 mmol/L (ref 22–29)
Calcium: 8.3 mg/dL — ABNORMAL LOW (ref 8.8–10.2)
Chloride: 103 mmol/L (ref 98–107)
Creatinine: 0.9 mg/dL (ref 0.5–1.0)
Est, Glom Filt Rate: 61 mL/min/1.73m?? — ABNORMAL LOW (ref >=90)
Glucose: 89 mg/dL (ref 70–99)
OSMOLALITY CALCULATED: 280 mOsm/kg (ref 270–287)
Potassium: 4 mmol/L (ref 3.5–5.3)
Sodium: 139 mmol/L (ref 135–145)

## 2021-05-03 MED ORDER — HYDRALAZINE HCL 25 MG PO TABS
25 MG | Freq: Three times a day (TID) | ORAL | Status: AC
Start: 2021-05-03 — End: 2021-05-06
  Administered 2021-05-03 – 2021-05-06 (×5): 25 mg via ORAL

## 2021-05-03 MED FILL — BD POSIFLUSH 0.9 % IV SOLN: 0.9 % | INTRAVENOUS | Qty: 10

## 2021-05-03 MED FILL — ELIQUIS 5 MG PO TABS: 5 MG | ORAL | Qty: 1

## 2021-05-03 MED FILL — FUROSEMIDE 20 MG PO TABS: 20 MG | ORAL | Qty: 1

## 2021-05-03 MED FILL — HYDRALAZINE HCL 10 MG PO TABS: 10 MG | ORAL | Qty: 1

## 2021-05-03 MED FILL — ASPIRIN 81 MG PO CHEW: 81 MG | ORAL | Qty: 1

## 2021-05-03 MED FILL — HYDRALAZINE HCL 25 MG PO TABS: 25 MG | ORAL | Qty: 1

## 2021-05-03 MED FILL — LISINOPRIL 20 MG PO TABS: 20 MG | ORAL | Qty: 2

## 2021-05-03 MED FILL — AMLODIPINE BESYLATE 10 MG PO TABS: 10 MG | ORAL | Qty: 1

## 2021-05-03 NOTE — Plan of Care (Signed)
Problem: Discharge Planning  Goal: Discharge to home or other facility with appropriate resources  Outcome: Progressing     Problem: Safety - Adult  Goal: Free from fall injury  Outcome: Progressing     Problem: ABCDS Injury Assessment  Goal: Absence of physical injury  Outcome: Progressing     Problem: Skin/Tissue Integrity  Goal: Absence of new skin breakdown  Description: 1.  Monitor for areas of redness and/or skin breakdown  2.  Assess vascular access sites hourly  3.  Every 4-6 hours minimum:  Change oxygen saturation probe site  4.  Every 4-6 hours:  If on nasal continuous positive airway pressure, respiratory therapy assess nares and determine need for appliance change or resting period.  Outcome: Progressing

## 2021-05-03 NOTE — Progress Notes (Signed)
[name]  [address]  [place] [address]  Phone: [phone]  Fax: [phone]  Acute Care Physical Therapy Treatment Note    (Link to Caseload Tracking PT Visit Days : 3  Acknowledge Orders   Time In/Out   PT Charge Capture   Rehab Caseload Tracker    Therapy Minute Tracking        1115  1140  25      Allison [name] is a 88 y.o. female   PRIMARY DIAGNOSIS Hypertensive emergency  Hypertensive emergency [I16.1]       @DIAGNOSES @    SUBJECTIVE   Pt agreeable to PT. Reported she didn't sleep well bc of pain in her feet.     Social/Functional Lives With:  (Granddaughter)  Type of Home: House  Home Layout: One level  Home Access:  (one step)  Home Equipment: Rollator, Help From: Family  ADL Assistance: Needs assistance  Homemaking Assistance: Independent  Ambulation Assistance: Independent  Transfer Assistance: Independent    OBJECTIVE     VITAL SIGNS / PAIN / OXYGEN LINES / DRAINS / PRECAUTIONS   Vital Signs        Oxygen        Pain - Pre Treatment  discomfort in feet (peripheral neuropathy??)    Pain - Post Treatment   IV and Telemetry     Precautions/Restrictions  Restrictions/Precautions: Fall Risk                 GROSS EVALUATION COMMENTS   AROM Within functional limits   PROM     Strength Generally decreased, functional   Balance     Posture     Sensation     Coordination      Tone       COGNITION / PERCEPTION COMMENTS   Orientation     Cognition     Vision     Hearing       MOBILITY LEVEL 1 2 Complete  I Mod I Supervision Setup CGA Min A Mod A Max A Total A Does Not Occur Comments     Roll Left []  []  []  []  []  []  []  []  []  []  []  []       Roll Right []  []  []  []  []  []  []  []  []  []  []  []       Supine to Sit []  []  []  []  []  []  []  []  []  []  []  []       Sit to Supine []  []  []  []  []  []  []  []  []  []  []  []       Scooting []  []  []  []  []  []  []  []  []  []  []  []       Edge of Bed Assist []  []  []  []  []  []  []  []  []  []  []  []     Transfer Sit to Stand []  []  []  []  []  []  []  []  []  []  []  []      Transfer Stand to Sit []  []  []  []  []  []  []  []  []  []  []  []     Transfer Bed to/from Chair []  []  []  []  []  []  []  []  []  []  []  []     Transfer Toilet []  []  []  []  []  []  []  []  []  []  []  []         AMBULATION LEVEL 1 2 Complete  I Mod I Supervision Setup CGA Min A Mod A Max A Total A Does Not Occur Comments   Level of Assistance []  []  []  []  []  []  []  []  []  []  []   AMBULATION  Distance   feet   DME    Gait Quality    Stairs        Ambulation Comments        BALANCE Good Fair+ Fair Fair- Poor NT Comments   Sitting Static []  []  []  []  []  []     Sitting Dynamic []  []  []  []  []  []               Standing Static []  []  []  []  []  []     Standing Dynamic []  []  []  []  []  []       ASSESSMENT      POST ACUTE RECOMMENDATIONS   Recommendation to date pending progress   Setting Skilled Nursing Facility (SNF) or home with HHPT and 24/7assist    Justification for Recommended Setting Recommended to help patient obtain maximum functional independence.     Equipment has appropriate equip     ASSESSMENT:  Supine->sit=mod I. Pt stood and transferred to commode with min A and cueing for sequencing. She stood with min A, assist for clean up. Pt ambulated with cane in R hand + HHA (min A) x 140'. Slower pace with flexed forward posture. More assist required for balance today. She returned to chair with all needs in reach. Pt very pleasant. Pt needs to use walker for increased stability. Progress as tolerated.      Goals      Time Frame for Short Term Goals: 7 days  Short Term Goal 1: supine->sit=mod I  Short Term Goal 2: sit->stand=CGA  Short Term Goal 3: ambulate with walker + CGA x 50'       SAFETY Alarm activated, Bed/Chair locked, Call light within reach, Chair, Heels floated, Needs within reach, and RN notified    Education Education Given To: Patient  Education Provided: Role of Therapy, Plan of Care  Education Method: Verbal    

## 2021-05-03 NOTE — Care Coordination-Inpatient (Signed)
Sent a referral to [facility] from [facility] to assist POA Tabitha in developing a plan for discharge from [facility] in case it is requested for acceptance. From therapy notes if patient decides to go home with home health she will require 24/7 assist and Boyd Kerbs can help with that as well. She currently has several volunteers that assists with care but it is not 24/7. Referrals sent to [facility],  [facility], [facility], and [facility]. Will continue to monitor.

## 2021-05-03 NOTE — Progress Notes (Addendum)
Progress Note    Date:05/03/2021       Room:4028/01  Patient Name:Allison [name]     Date of Birth:08-May-1928     Age:88 y.o.    Subjective   No events overnight. Patient denies chest pain. Just feels weak and states her feet sometimes bother her which she takes Tylenol for.    Medications   Scheduled Meds:    hydrALAZINE  25 mg Oral 3 times per day    lisinopril  40 mg Oral Daily    amLODIPine  10 mg Oral Daily    polyethylene glycol  17 g Oral Once    sodium chloride flush  5-40 mL IntraVENous 2 times per day    apixaban  5 mg Oral BID    aspirin  81 mg Oral Daily    furosemide  20 mg Oral Daily     Continuous Infusions:    sodium chloride       PRN Meds: meclizine, sodium chloride flush, sodium chloride, ondansetron **OR** ondansetron, polyethylene glycol, aluminum & magnesium hydroxide-simethicone, acetaminophen **OR** acetaminophen, hydrALAZINE      Physical Examination      Vitals:  BP 130/63    Pulse 65    Temp 98.3 ??F (36.8 ??C) (Oral)    Resp 16    Ht 5' 6.14" (1.68 m)    Wt 136 lb 11 oz (62 kg)    SpO2 96%    BMI 21.97 kg/m??   Temp (24hrs), Avg:97.9 ??F (36.6 ??C), Min:97.2 ??F (36.2 ??C), Max:98.4 ??F (36.9 ??C)      I/O (24Hr):    Intake/Output Summary (Last 24 hours) at 05/03/2021 1554  Last data filed at 05/02/2021 1829  Gross per 24 hour   Intake 50 ml   Output 50 ml   Net 0 ml       UKG:URKYH, pleasant woman, Alert and oriented appropriately  HEENT: no JVD  CV: regular rate and rhythm, no murmurs  Pulm: clear to auscultation bilaterally  Abd: soft, nontender  Ext: no edema. Warm and well perfused  Skin: no rashes  Neuro: no focal deficits  Psychiatric: appropriate mood and affect  Labs:  Recent Results (from the past 24 hour(s))   PLEASE READ & DOCUMENT PPD TEST IN 48 HRS    Collection Time: 05/03/21 12:00 AM   Result Value Ref Range    PPD, (POC) Negative Negative    mm Induration 00 mm 0 - 5 mm   Basic Metabolic Panel    Collection Time: 05/03/21  5:19 AM   Result Value Ref Range    Sodium 139 135 - 145  mmol/L    Potassium 4.0 3.5 - 5.3 mmol/L    Chloride 103 98 - 107 mmol/L    CO2 28 22 - 29 mmol/L    Glucose 89 70 - 99 mg/dL    BUN 21 8 - 23 mg/dL    Creatinine 0.9 0.5 - 1.0 mg/dL    Anion Gap 8 2 - 17 mmol/L    OSMOLALITY CALCULATED 280 270 - 287 mOsm/kg    Calcium 8.3 (L) 8.8 - 10.2 mg/dL    Est, Glom Filt Rate 61 (L) >=90 mL/min/1.75m??        Imaging Last 24 Hours:  Transthoracic echocardiogram (TTE) complete with contrast, bubble, strain, and 3D PRN    Result Date: 05/01/2021    Left Ventricle: Normal left ventricular systolic function with a visually estimated EF of 55 - 60%. Left ventricle size is normal.  Normal wall thickness. Grossly normal wall motion. Abnormal diastolic function.   Aortic Valve: Valve structure is normal. Mild sclerosis of the aortic valve cusp.   Mitral Valve: Valve structure is normal. Mild annular calcification of the mitral valve.   Technical qualifiers: Technically difficult study due to low parasternal window.         Assessment/Plan:        Hospital Problems             Last Modified POA    * (Principal) Hypertensive emergency 04/29/2021 Yes     Hypertensive emergency  Cardene gtt dropped BP too quickly on admission 04/29/21 with SBP up to .      Home regimen appears to be Amlodipine 10mg  qd, Lisinopril 20mg  qd, and Lasix 20mg  qd  BP remains quite labile  Lisinopril increased to 40mg  qd while continuing Amlodipine 10mg  qd  Hydralazine 10mg  BID, still with SBP's appoaching thus increased to 25mg  TID with plans to hold if SBP <153mmHg to limit risk of hypotension  Hydralazine 10 mg IV PRN for SBP>180   Titrate regimen as appropriate based on 24 hour trend   ECHO yesterday showed EF 55-60% with abnormal DD     Chest pain  Negative troponin x 2.  EKG without ischemic changes.  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    Code status: Full Code . This is different from her last  admission (DNR).  DISPO: Patient should be medically stable for DC over the weekend. Planning on discharge to [facility] Monday 05/06/21. PPD placed 1/18      05/03/21

## 2021-05-04 LAB — BASIC METABOLIC PANEL
Anion Gap: 7 mmol/L (ref 2–17)
BUN: 19 mg/dL (ref 8–23)
CO2: 29 mmol/L (ref 22–29)
Calcium: 8.5 mg/dL — ABNORMAL LOW (ref 8.8–10.2)
Chloride: 103 mmol/L (ref 98–107)
Creatinine: 0.9 mg/dL (ref 0.5–1.0)
Est, Glom Filt Rate: 61 mL/min/1.73m?? — ABNORMAL LOW (ref >=90)
Glucose: 84 mg/dL (ref 70–99)
OSMOLALITY CALCULATED: 279 mOsm/kg (ref 270–287)
Potassium: 4.1 mmol/L (ref 3.5–5.3)
Sodium: 139 mmol/L (ref 135–145)

## 2021-05-04 LAB — CBC
Hematocrit: 37.5 % (ref 34.0–47.0)
Hemoglobin: 12.6 g/dL (ref 11.5–15.7)
MCH: 28.1 pg (ref 27.0–34.5)
MCHC: 33.6 g/dL (ref 32.0–36.0)
MCV: 83.5 fL (ref 81.0–99.0)
MPV: 11.2 fL (ref 7.2–13.2)
NRBC Absolute: 0 10*3/uL (ref 0.000–0.012)
NRBC Automated: 0 % (ref 0.0–0.2)
Platelets: 128 10*3/uL — ABNORMAL LOW (ref 140–440)
RBC: 4.49 x10e6/mcL (ref 3.60–5.20)
RDW: 13.7 % (ref 11.0–16.0)
WBC: 4.9 10*3/uL (ref 3.8–10.6)

## 2021-05-04 LAB — IMMATURE CELLS
IMMATURE PLT ABSOLUTE: 5.8 10*3/uL
IMMATURE PLT PERCENT: 4.5 % (ref 1.2–8.6)

## 2021-05-04 MED FILL — ASPIRIN 81 MG PO CHEW: 81 MG | ORAL | Qty: 1

## 2021-05-04 MED FILL — HYDRALAZINE HCL 25 MG PO TABS: 25 MG | ORAL | Qty: 1

## 2021-05-04 MED FILL — BD POSIFLUSH 0.9 % IV SOLN: 0.9 % | INTRAVENOUS | Qty: 20

## 2021-05-04 MED FILL — BD POSIFLUSH 0.9 % IV SOLN: 0.9 % | INTRAVENOUS | Qty: 10

## 2021-05-04 MED FILL — TYLENOL 325 MG PO TABS: 325 MG | ORAL | Qty: 2

## 2021-05-04 MED FILL — AMLODIPINE BESYLATE 10 MG PO TABS: 10 MG | ORAL | Qty: 1

## 2021-05-04 MED FILL — LISINOPRIL 20 MG PO TABS: 20 MG | ORAL | Qty: 2

## 2021-05-04 MED FILL — ELIQUIS 5 MG PO TABS: 5 MG | ORAL | Qty: 1

## 2021-05-04 MED FILL — FUROSEMIDE 20 MG PO TABS: 20 MG | ORAL | Qty: 1

## 2021-05-04 NOTE — Progress Notes (Signed)
Progress Note    Date:05/04/2021       Room:4028/01  Patient Name:Allison [name]     Date of Birth:11-Mar-1929     Age:88 y.o.    Subjective   Patient complains of headache. Denies chest pain       Medications   Scheduled Meds:    hydrALAZINE  25 mg Oral 3 times per day    lisinopril  40 mg Oral Daily    amLODIPine  10 mg Oral Daily    polyethylene glycol  17 g Oral Once    sodium chloride flush  5-40 mL IntraVENous 2 times per day    apixaban  5 mg Oral BID    aspirin  81 mg Oral Daily    furosemide  20 mg Oral Daily     Continuous Infusions:    sodium chloride       PRN Meds: meclizine, sodium chloride flush, sodium chloride, ondansetron **OR** ondansetron, polyethylene glycol, aluminum & magnesium hydroxide-simethicone, acetaminophen **OR** acetaminophen, hydrALAZINE      Physical Examination      Vitals:  BP (!) 140/66    Pulse 56    Temp 97.7 ??F (36.5 ??C) (Oral)    Resp 18    Ht 5' 6.14" (1.68 m)    Wt 126 lb 15.8 oz (57.6 kg)    SpO2 98%    BMI 20.41 kg/m??   Temp (24hrs), Avg:98 ??F (36.7 ??C), Min:97.7 ??F (36.5 ??C), Max:98.2 ??F (36.8 ??C)      I/O (24Hr):    Intake/Output Summary (Last 24 hours) at 05/04/2021 1355  Last data filed at 05/04/2021 0514  Gross per 24 hour   Intake --   Output 300 ml   Net -300 ml       HWE:XHBZJ, pleasant woman, Alert and oriented appropriately  HEENT: no JVD  CV: regular rate and rhythm, no murmurs  Pulm: clear to auscultation bilaterally  Abd: soft, nontender  Ext: no edema. Warm and well perfused  Skin: no rashes  Neuro: no focal deficits  Psychiatric: appropriate mood and affect    Labs/Imaging/Diagnostics   Labs:  Recent Results (from the past 24 hour(s))   Basic Metabolic Panel    Collection Time: 05/04/21  5:30 AM   Result Value Ref Range    Sodium 139 135 - 145 mmol/L    Potassium 4.1 3.5 - 5.3 mmol/L    Chloride 103 98 - 107 mmol/L    CO2 29 22 - 29 mmol/L    Glucose 84 70 - 99 mg/dL    BUN 19 8 - 23 mg/dL    Creatinine 0.9 0.5 - 1.0 mg/dL    Anion Gap 7 2 - 17 mmol/L     OSMOLALITY CALCULATED 279 270 - 287 mOsm/kg    Calcium 8.5 (L) 8.8 - 10.2 mg/dL    Est, Glom Filt Rate 61 (L) >=90 mL/min/1.23m??   CBC    Collection Time: 05/04/21  5:30 AM   Result Value Ref Range    WBC 4.9 3.8 - 10.6 x10e3/mcL    RBC 4.49 3.60 - 5.20 x10e6/mcL    Hemoglobin 12.6 11.5 - 15.7 g/dL    Hematocrit 69.6 78.9 - 47.0 %    MCV 83.5 81.0 - 99.0 fL    MCH 28.1 27.0 - 34.5 pg    MCHC 33.6 32.0 - 36.0 g/dL    RDW 38.1 01.7 - 51.0 %    Platelets 128 (L) 140 - 440 x10e3/mcL  MPV 11.2 7.2 - 13.2 fL    NRBC Automated 0.0 0.0 - 0.2 %    NRBC Absolute 0.000 0.000 - 0.012 x10e3/mcL   Immature Platelet Fraction    Collection Time: 05/04/21  5:30 AM   Result Value Ref Range    IMMATURE PLT ABSOLUTE 5.8 x10e3/mcL    IMMATURE PLT PERCENT 4.5 1.2 - 8.6 %        Imaging Last 24 Hours:  No results found.      Assessment/Plan:        Hospital Problems             Last Modified POA    * (Principal) Hypertensive emergency 04/29/2021 Yes     Hypertensive emergency  Cardene gtt dropped BP too quickly on admission 04/29/21 with SBP up to .      Home regimen appears to be Amlodipine 10mg  qd, Lisinopril 20mg  qd, and Lasix 20mg  qd  BP remains quite labile  Lisinopril increased to 40mg  qd while continuing Amlodipine 10mg  qd  Hydralazine 10mg  BID, still with SBP's appoaching thus increased to 25mg  TID with plans to hold if SBP <127mmHg to limit risk of hypotension  Hydralazine 10 mg IV PRN for SBP>180   Titrate regimen as appropriate based on 24 hour trend. Bps better today  ECHO 1/19 showed EF 55-60% with abnormal DD     Chest pain  Trops negative.Not ACS. 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    Code status: Full Code . This is different from her last admission (DNR).  DISPO: Patient s medically stable for DC  Planning on discharge to [facility] Monday 05/06/21. PPD placed 1/18    Electronically signed by 45m, MD on 05/04/21  at 1:55 PM EST

## 2021-05-05 MED FILL — BD POSIFLUSH 0.9 % IV SOLN: 0.9 % | INTRAVENOUS | Qty: 10

## 2021-05-05 MED FILL — AMLODIPINE BESYLATE 10 MG PO TABS: 10 MG | ORAL | Qty: 1

## 2021-05-05 MED FILL — ELIQUIS 5 MG PO TABS: 5 MG | ORAL | Qty: 1

## 2021-05-05 MED FILL — TYLENOL 325 MG PO TABS: 325 MG | ORAL | Qty: 2

## 2021-05-05 MED FILL — FUROSEMIDE 20 MG PO TABS: 20 MG | ORAL | Qty: 1

## 2021-05-05 MED FILL — LISINOPRIL 20 MG PO TABS: 20 MG | ORAL | Qty: 2

## 2021-05-05 MED FILL — HYDRALAZINE HCL 25 MG PO TABS: 25 MG | ORAL | Qty: 1

## 2021-05-05 MED FILL — ASPIRIN 81 MG PO CHEW: 81 MG | ORAL | Qty: 1

## 2021-05-05 NOTE — Progress Notes (Signed)
Progress Note    Date:05/05/2021       Room:4028/01  Patient Name:Allison [name]     Date of Birth:17-Jul-1928     Age:88 y.o.    Subjective   [name] in decent spirits today. She continues to complain of what sounds like chronic arthritic pain in her feet, L > R ankles this morning. She had a slight headache yesterday improved with tylenol, but no vision changes, chest pain, or shortness of breath.     Medications   Scheduled Meds:    hydrALAZINE  25 mg Oral 3 times per day    lisinopril  40 mg Oral Daily    amLODIPine  10 mg Oral Daily    polyethylene glycol  17 g Oral Once    apixaban  5 mg Oral BID    aspirin  81 mg Oral Daily    furosemide  20 mg Oral Daily     Continuous Infusions:    sodium chloride       PRN Meds: meclizine, sodium chloride flush, sodium chloride, ondansetron **OR** ondansetron, polyethylene glycol, aluminum & magnesium hydroxide-simethicone, acetaminophen **OR** acetaminophen, hydrALAZINE      Physical Examination      Vitals:  BP 115/63    Pulse 65    Temp 97.5 ??F (36.4 ??C) (Oral)    Resp 16    Ht 5' 6.14" (1.68 m)    Wt 142 lb 6.7 oz (64.6 kg)    SpO2 98%    BMI 22.89 kg/m??   Temp (24hrs), Avg:97.9 ??F (36.6 ??C), Min:97.5 ??F (36.4 ??C), Max:98.6 ??F (37 ??C)      I/O (24Hr):    Intake/Output Summary (Last 24 hours) at 05/05/2021 1148  Last data filed at 05/05/2021 0700  Gross per 24 hour   Intake 400 ml   Output 350 ml   Net 50 ml         BJS:EGBTD, elderly BF. Alert and oriented appropriately  HEENT: no JVD  CV: regular rate and rhythm, no murmurs  Pulm: clear to auscultation bilaterally  Abd: soft, nontender  Ext: no edema. Warm and well perfused. Bilateral ankles L>R tender to palpation, but no swelling, warmth, or open lesions apparent.   Skin: no rashes  Neuro: no focal deficits  Psychiatric: appropriate mood and affect  Labs:  No results found for this or any previous visit (from the past 24 hour(s)).       Imaging Last 24 Hours:  Transthoracic echocardiogram (TTE) complete with  contrast, bubble, strain, and 3D PRN    Result Date: 05/01/2021    Left Ventricle: Normal left ventricular systolic function with a visually estimated EF of 55 - 60%. Left ventricle size is normal. Normal wall thickness. Grossly normal wall motion. Abnormal diastolic function.   Aortic Valve: Valve structure is normal. Mild sclerosis of the aortic valve cusp.   Mitral Valve: Valve structure is normal. Mild annular calcification of the mitral valve.   Technical qualifiers: Technically difficult study due to low parasternal window.         Assessment/Plan:        Hospital Problems             Last Modified POA    * (Principal) Hypertensive emergency 04/29/2021 Yes   Hypertension/Hypertensive emergency  Initially had SBP up to 205 mmHg requiring Cardene gtt. Restarted home regimen of Home regimen Amlodipine 10mg  qd, Lisinopril 20mg  qd, and Lasix 20mg  qd, though titrating meds. Echo showed EF 55-60% with abnormal  DD. Much improved BP trends ranging in 115-162 SBP range last 24 h.  - Continue current regimen of hydralazine 10 mg tid, lisinopril 40mg /d, amlodipine 10mg /d, and furosemide 20mg /d      Chest pain  Negative troponin x 2.  EKG without ischemic changes.  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     DVT prophylaxis: eliquis   .    Code status: Full Code . This is different from her last admission (DNR).  DISPO: Patient should be medically stable for DC over the weekend. Planning on discharge to [facility] Monday 05/06/21. PPD placed 1/18

## 2021-05-06 LAB — COVID-19, SURVEILLANCE (ASYMPTOMATIC/NO EXPOSURE, OR TEST OF CURE): SARS Cov2 Ag FIA: NEGATIVE

## 2021-05-06 MED ORDER — MECLIZINE HCL 12.5 MG PO TABS
12.5 MG | ORAL_TABLET | Freq: Three times a day (TID) | ORAL | 0 refills | Status: DC | PRN
Start: 2021-05-06 — End: 2022-08-07

## 2021-05-06 MED ORDER — HYDRALAZINE HCL 25 MG PO TABS
25 MG | ORAL_TABLET | Freq: Three times a day (TID) | ORAL | 0 refills | Status: AC
Start: 2021-05-06 — End: ?

## 2021-05-06 MED ORDER — AMLODIPINE BESYLATE 10 MG PO TABS
10 MG | ORAL_TABLET | Freq: Every day | ORAL | 0 refills | Status: AC
Start: 2021-05-06 — End: ?

## 2021-05-06 MED ORDER — LISINOPRIL 40 MG PO TABS
40 MG | ORAL_TABLET | Freq: Every day | ORAL | 0 refills | Status: AC
Start: 2021-05-06 — End: ?

## 2021-05-06 MED FILL — ELIQUIS 5 MG PO TABS: 5 MG | ORAL | Qty: 1

## 2021-05-06 MED FILL — HYDRALAZINE HCL 25 MG PO TABS: 25 MG | ORAL | Qty: 1

## 2021-05-06 MED FILL — ASPIRIN 81 MG PO CHEW: 81 MG | ORAL | Qty: 1

## 2021-05-06 NOTE — Care Coordination-Inpatient (Signed)
05/06/21 1218   IMM Letter   IMM Letter given to Patient/Family/Significant other/Guardian/POA/by: Guinevere Ferrari BSN, RN Case Manager   IMM Letter date given: 05/06/21   IMM Letter time given: 1142

## 2021-05-06 NOTE — Discharge Summary (Addendum)
[facility] Hospitalist Service     Discharge Summary   Patient Name:  Allison [name]   Patient Age:  88 y.o.   Patient DOB:  [birth year]   MRN:  [id number]   Admit date:  04/29/2021   Discharge date:   05/06/2021    Admitting Physician:  Rulon Abide, MD    Admission Diagnoses:  Hypertensive emergency [I16.1]   Discharge Physician:  Colonel Bald, DO    Discharge Diagnoses:     Patient Active Problem List    Diagnosis Date Noted   ??? Hypertensive emergency 04/29/2021      Consults:   PT/OT   Significant Diagnostic   Studies:  ECHO    Disposition:  [facility]        Brief History of Presenting Illness:  Per Admission H&P  88 yo F with history of PE on eliquis, HTN, HLD presented to the emergency department at [facility] today due to chest pain.  She has had symptoms for quite some time.  She was seen by her outpatient providers and was scheduled for chest x-ray and EKG to be performed this Tuesday.  However, the pain became so severe this evening as she presented to the emergency department.  It is difficult to ascertain where her pain is located.  It sounds as it is generally left-sided and radiates throughout her chest.  No correlation with rest or exertion.  No nausea, vomiting, diaphoresis.  No lightheadedness, dizziness, syncope.  No palpitations.  No exacerbating or relieving factors.  The pain comes on suddenly, lasts very briefly (approximately 30 seconds), and then resolves.  She is currently chest pain free.  Incidentally, she was noted to have elevated blood pressures in the emergency department.  She was given enalapril and lisinopril in the ED, but eventually, she required control with a cardene drip.  Thus, hospitalist was consulted for admission.  She was transferred to [facility] [place] due to bed availability.    Floor Course    Upon admission, patient was continued on Cardene drip and home medications were restarted.  She had a negative troponin x2 and EKG was obtained that showed no evidence of  ischemic changes.  Echocardiogram was obtained and showed an LVEF of 55 to 60% with abnormal diastolic dysfunction.  Over the next 24 hours, BP dropped to SBP ~130s.  Cardene gtt. was discontinued.  However blood pressure intermittently spiked (SBP>180) necessitating titration of her antihypertensive regimen.  Amlodipine was increased to 10 mg daily, lisinopril was increased to 40 mg daily, Lasix was continued at 20 mg daily and hydralazine 10 mg 3 times daily was added.  Over the next several days, patient had extremely labile blood pressures with SBPs ranging from 100-190s.  Patient did report recurrence of chest pain with severely elevated blood pressures.  Ultimately, hydralazine was increased to 25 mg 3 times daily but with a hold parameter for SBP<150.  BP trend much improved and within goal (goal: SBP less than 150).  Physical therapy and Occupational Therapy were consulted and recommended discharge to a skilled nursing facility.  Patient has been accepted to [facility] and is planned for discharge today.  Upon evaluation, Allison [name] states she is feeling well and denies chest pain, shortness of breath, headache or dizziness.       Medication List        START taking these medications      hydrALAZINE 25 MG tablet  Commonly known as: APRESOLINE  Take 1 tablet by mouth  every 8 hours Hold for SBP<150            CHANGE how you take these medications      amLODIPine 10 MG tablet  Commonly known as: NORVASC  Take 1 tablet by mouth daily  Start taking on: May 07, 2021  What changed:   medication strength  how much to take     lisinopril 40 MG tablet  Commonly known as: PRINIVIL;ZESTRIL  Take 1 tablet by mouth daily  What changed:   medication strength  how much to take  when to take this  reasons to take this     meclizine 12.5 MG tablet  Commonly known as: ANTIVERT  Take 1 tablet by mouth 3 times daily as needed for Dizziness  What changed:   how much to take  when to take this  reasons to take this             CONTINUE taking these medications      aspirin 81 MG chewable tablet     ELIQUIS PO     furosemide 20 MG tablet  Commonly known as: LASIX               Where to Get Your Medications        Information about where to get these medications is not yet available    Ask your nurse or doctor about these medications  amLODIPine 10 MG tablet  hydrALAZINE 25 MG tablet  lisinopril 40 MG tablet  meclizine 12.5 MG tablet         Vitals:    05/05/21 1728 05/05/21 2113 05/06/21 0133 05/06/21 0615   BP: (!) 147/66 (!) 141/75 (!) 153/79 (!) 157/73   Pulse: 62 62 67 63   Resp: 16 16 16 16    Temp: 98 ??F (36.7 ??C) 97.5 ??F (36.4 ??C) 97.5 ??F (36.4 ??C) 97.7 ??F (36.5 ??C)   TempSrc: Oral Oral Oral Oral   SpO2: 98% 92% 98% 93%   Weight:       Height:           General - Frail, pleasant, elderly female, sitting up in the bed, awake and responsive, NAD   HEENT - NCAT, non-icteric sclera, no subconjunctival pallor, moist mucous membranes   Cardio - RRR, without MRG  Pulm - CTA bilaterally, no wheezing, normal respiratory effort   GI - soft, ND NT with normoactive BS, absent guarding or rebound tenderness  Ext - symmetrical muscle strength bilateral upper and lower extremities, no edema  Skin - no rashes or suspicious lesions observed; no localized erythema   Neuro - no focal weakness, no slurred speech, no facial droop  Psychiatric - appropriate mood and affect    Studies:     Recent Labs     05/04/21  0530   NA 139   K 4.1   CL 103   CO2 29   BUN 19   CREATININE 0.9     Recent Labs     05/04/21  0530   HGB 12.6   HCT 37.5   WBC 4.9   PLT 128*     No results found.      Issues To Be Addressed On Follow Up   Antihypertensive regimen titration as needed        Future Appointment(s)   Recommended or Scheduled Appointments:    [facility] ([facility])  [address]  [place] [id number]  [phone]  It is critical that you make your follow-up appointment(s). If you are discharged on the  weekend or after business hours, or if we are unable to schedule these appointments for you for any reason, you or a family member need to call during the next business day to schedule your appointment(s).       Patient Instructions    Discharge Medications:  As above   Discharge Activity:  As tolerated     Discharge Diet:  cardiac diet     Special Instructions: Strict return instructions provided for recurrent or worsening symptoms.       Spent 41 minutes in coordinating discharge, writing prescriptions, discussing discharge planning for this patient.       Lashuna Tamashiro Loree Fee, [place]  [facility] Hospitalist Service  05/06/2021 9:14 AM

## 2021-05-06 NOTE — Care Coordination-Inpatient (Signed)
05/06/21 1203   Discharge Planning   Type of Residence Skilled Nursing Facility   Living Arrangements Family Members   Current Services Prior To Admission Durable Medical Equipment   Current DME Prior to [facility]   Potential Assistance Needed N/A   DME Ordered? No   Potential Assistance Purchasing Medications No   Type of Home Care Services None   Patient expects to be discharged to: Skilled nursing facility   Services At/After Discharge   Transition of Care Consult (CM Consult) SNF   Services At/After Discharge Skilled Nursing Facility (SNF)     Patient cleared for discharge. Dispo to [facility]. Transfer packet given to nurse to call report. Pick up scheduled for 12pm. Family and patient notified. CM available if needs arise prior to discharge.

## 2021-05-06 NOTE — Progress Notes (Signed)
Physical Therapy Attempt Note    Attempted to see patient this AM for Physical Therapy treatment  session. Patient has already d/c upon arrival.    Shukri Nistler Scott Ota Ebersole    Rehab Caseload Tracker

## 2021-07-10 NOTE — Progress Notes (Signed)
Formatting of this note is different from the original.  Images from the original note were not included.      Subjective:     Patient ID: Allison [name] 88 y.o. female    Chief Complaint   Patient presents with   ? Follow-up     Possible UTI wants to make sure and ankle pain @ level 10      HPI: Increased ankle pain. Use of gabapentin 100mg  qhs helps to relax her but does not totally treat the pain. Tylenol & voltaren gel at night unhelpful.     Noticed urinary frequency with dark colored urine.     BP elevated today but controlled at home.     The following portions of the patient's history were reviewed and updated as appropriate: allergies, current medications, and past medical history.   Objective:   Last 5 vital signs:      07/10/2021     3:05 PM 07/10/2021     2:43 PM 05/28/2021     3:06 PM 05/28/2021     2:59 PM 03/26/2021     2:58 PM   Vitals - 1 value per visit   SYSTOLIC 152 166 03/28/2021 162 180   DIASTOLIC 79 92 73 83 90   Heart Rate 56 56 76 60    WEIGHT (kg)  60.51 kg  62.959 kg    HEIGHT (in)  165.1 cm  165.1 cm    WEIGHT (lb)  133 lb 6.4 oz  138 lb 12.8 oz    HEIGHT (in)  65 in  65 in    SpO2  96 %  96 %    BSA (m2)  1.67 m2    1.67 m2  1.7 m2    1.7 m2    BMI  22.2 kg/m2    22.2 kg/m2  23.1 kg/m2    23.1 kg/m2      Physical Exam  Neck:      Thyroid: No thyromegaly.      Vascular: No carotid bruit.   Cardiovascular:      Rate and Rhythm: Normal rate and regular rhythm.      Heart sounds: Normal heart sounds.   Pulmonary:      Effort: Pulmonary effort is normal. No respiratory distress.      Breath sounds: Normal breath sounds.   Musculoskeletal:      Right lower leg: 1+ Pitting Edema present.      Left lower leg: 1+ Pitting Edema present.      Right ankle: Swelling present. Normal range of motion.      Left ankle: Swelling present. Normal range of motion.      Right foot: Normal range of motion.      Left foot: Normal range of motion.   Psychiatric:         Mood and Affect: Mood and affect normal.          Cognition and Memory: Memory normal.         Judgment: Judgment normal.     Results for orders placed or performed in visit on 07/10/21   POCT URINALYSIS (AUTO W/O SCOPE)   Result Value Ref Range    Spec Grav, UA 1.030 <=1.005, 1.000, 1.005, 1.010, 1.015, 1.020, 1.025, 1.030 NULL    pH, UA 5.5 5.0, 6.0, 6.5, 9.0, 5.5 NULL    Leukocytes, UA Negative NEG, Negative, 1+, 2+ LEU/UL    Nitrite, UA Negative Negative, Neg NULL    Protein, Urine (  Various) Negative NEG, Negative mg/dL    Glucose, Urine (Various) USER ENTERED Negative NEG, Negative, 1.000, Normal, 50 MG/DL    Ketones, UA Negative NEG, Negative, 5.0 MG/DL    Urobilinogen, UA 0.2 1.0, Normal, 12.0, 0.2 MG/DL    Bilirubin, POC Negative NEG, +, ++, +++, Negative NULL    Blood, UA Trace-Intact (!) NEG, TRACE ERY, ABOUT 50 ERY, ABOUT 250 ERY, TRACE HB, ABOUT 50 HB, ABOUT 250 HB, Negative    Color, UA YELLOW NULL    Appearance, Fluid Clear Clear, Slightly Cloudy, Cloudy, Turbid, Other, Very Cloudy, Bloody, Mucous     Assessment / Plan:   1. Other microscopic hematuria  - POCT URINALYSIS (AUTO W/O SCOPE)  - Urine culture; Future    2. Chronic pain of both ankles  Stretches today under AVS. Use of voltaren, gabapentin, & tylenol at night. Consider HH PT      Electronically signed by Arneta Cliche, NP at 07/10/2021  5:17 PM EDT

## 2021-10-18 NOTE — Telephone Encounter (Signed)
Formatting of this note might be different from the original.  [facility]- spoke with pt granddaughter    LV with Hoover Browns, NP was 07/10/21. Next appt 11/22/21    Needs:  MWE - Appt scheduled for 02/26/22 at 1 pm    Review Health Maintenance / BPA's at next office visit.        Electronically signed by Carren Rang, LPN at 30/16/0109  3:06 PM EDT

## 2021-11-25 ENCOUNTER — Inpatient Hospital Stay
Admit: 2021-11-25 | Discharge: 2021-11-25 | Disposition: A | Discharge status: Home / Self Care | Payer: MEDICARE | Attending: Emergency Medicine

## 2021-11-25 DIAGNOSIS — R1030 Lower abdominal pain, unspecified: Secondary | ICD-10-CM

## 2021-11-25 LAB — CBC WITH AUTO DIFFERENTIAL
Absolute Baso #: 0.1 10*3/uL (ref 0.0–0.2)
Absolute Eos #: 0.2 10*3/uL (ref 0.0–0.5)
Absolute Lymph #: 2.4 10*3/uL (ref 1.0–3.2)
Absolute Mono #: 0.6 10*3/uL (ref 0.3–1.0)
Basophils %: 1.2 % (ref 0.0–2.0)
Eosinophils %: 4.6 % (ref 0.0–7.0)
Hematocrit: 35.7 % (ref 34.0–47.0)
Hemoglobin: 12.9 g/dL (ref 11.5–15.7)
Immature Grans (Abs): 0.01 10*3/uL (ref 0.00–0.06)
Immature Granulocytes: 0.2 % (ref 0.0–0.6)
Lymphocytes: 49.5 % — ABNORMAL HIGH (ref 15.0–45.0)
MCH: 29 pg (ref 27.0–34.5)
MCHC: 36.1 g/dL — ABNORMAL HIGH (ref 32.0–36.0)
MCV: 80.2 fL — ABNORMAL LOW (ref 81.0–99.0)
MPV: 10.5 fL (ref 7.2–13.2)
Monocytes: 11.4 % (ref 4.0–12.0)
NRBC Absolute: 0 10*3/uL (ref 0.000–0.012)
NRBC Automated: 0 % (ref 0.0–0.2)
Neutrophils %: 33.1 % — ABNORMAL LOW (ref 42.0–74.0)
Neutrophils Absolute: 1.6 10*3/uL (ref 1.6–7.3)
Platelets: 178 10*3/uL (ref 140–440)
RBC: 4.45 x10e6/mcL (ref 3.60–5.20)
RDW: 13.9 % (ref 11.0–16.0)
WBC: 4.8 10*3/uL (ref 3.8–10.6)

## 2021-11-25 LAB — COMPREHENSIVE METABOLIC PANEL
ALT: 9 U/L (ref 0–35)
AST: 19 U/L (ref 0–35)
Albumin/Globulin Ratio: 1.37 (ref 1.00–2.70)
Albumin: 3.9 g/dL (ref 3.5–5.2)
Alk Phosphatase: 49 U/L (ref 35–117)
Anion Gap: 10 mmol/L (ref 2–17)
BUN: 11 mg/dL (ref 8–23)
CO2: 27 mmol/L (ref 22–29)
Calcium: 9.1 mg/dL (ref 8.8–10.2)
Chloride: 106 mmol/L (ref 98–107)
Creatinine: 0.7 mg/dL (ref 0.5–1.0)
Est, Glom Filt Rate: 82 mL/min/1.73m (ref >=60)
Globulin: 2.9 g/dL (ref 1.9–4.4)
Glucose: 86 mg/dL (ref 70–99)
OSMOLALITY CALCULATED: 283 mOsm/kg (ref 270–287)
Potassium: 3.6 mmol/L (ref 3.5–5.3)
Sodium: 143 mmol/L (ref 135–145)
Total Bilirubin: 0.6 mg/dL (ref 0.00–1.20)
Total Protein: 6.8 g/dL (ref 6.4–8.3)

## 2021-11-25 LAB — POC URINALYSIS, CHEMISTRY
Bilirubin, Urine, POC: NEGATIVE
Blood, UA POC: NEGATIVE
Glucose, UA POC: NEGATIVE mg/dL
Ketones, Urine, POC: NEGATIVE mg/dL
Leukocytes, UA: NEGATIVE
Nitrate, UA POC: NEGATIVE
Protein, Urine, POC: NEGATIVE
Specific Gravity, Urine, POC: 1.015 (ref 1.003–1.035)
Urine Urobilinogen: 1 EU/dL
pH, Urine, POC: 7.5 (ref 4.5–8.0)

## 2021-11-25 LAB — LIPASE: Lipase: 12 U/L — ABNORMAL LOW (ref 13–60)

## 2021-11-25 MED ORDER — FAMOTIDINE 20 MG PO TABS
20 MG | ORAL_TABLET | Freq: Two times a day (BID) | ORAL | 0 refills | Status: DC
Start: 2021-11-25 — End: 2022-08-07

## 2021-11-25 MED ORDER — SODIUM CHLORIDE 0.9 % IV BOLUS
0.9 % | Freq: Once | INTRAVENOUS | Status: AC
Start: 2021-11-25 — End: 2021-11-25
  Administered 2021-11-25: 14:00:00 1000 mL via INTRAVENOUS

## 2021-11-25 NOTE — ED Provider Notes (Signed)
[facility] EMERGENCY DEPT  EMERGENCY DEPARTMENT ENCOUNTER      Pt Name: Allison [name]  MRN: [id number]  Birthdate 11-23-1928  Date of evaluation: 11/25/2021  Provider: Rayann Heman, MD  Provider evaluation time: 11/25/21 [phone]     CHIEF COMPLAINT       Chief Complaint   Patient presents with    Abdominal Pain     Pt brought in by EMS for abdominal pain that started a few weeks ago. C/O of increased urination and difficulty going. Nausea with intake. Alert and oriented x3 with 24 hour care at home.         HISTORY OF PRESENT ILLNESS    Patient presents for intermittent lower abdominal pain.  Initially, it was unclear whether this pain has been for the last day or 2 or for the last 3 weeks.  It sounds as if she has had intermittent pain for a few weeks but potentially its been more constant for the last 24 hours.  The patient states that she gets discomfort sometimes after she eats though not immediately after she eats.  Her pain is lower abdominal and/or suprapubic.  She states that she has had similar pain in the distant past which they initially thought was associated with fibroids.  However when they went to do surgery or other treatment on that, the fibroids were gone according to the patient.  Her symptoms are mild to moderate.          Nursing Notes were reviewed.    REVIEW OF SYSTEMS       Review of Systems   Constitutional:  Negative for chills and fever.   HENT:  Negative for rhinorrhea and trouble swallowing.    Respiratory:  Negative for cough and shortness of breath.    Cardiovascular:  Negative for chest pain and palpitations.   Gastrointestinal:  Positive for abdominal pain. Negative for nausea.   Genitourinary:  Negative for dysuria, flank pain and frequency.   Musculoskeletal:  Negative for back pain, myalgias and neck pain.   Skin:  Negative for pallor and rash.   Neurological:  Negative for dizziness, syncope and weakness.   Psychiatric/Behavioral:  Negative for confusion and hallucinations.    All  other systems reviewed and are negative.    Except as noted above the remainder of the review of systems was reviewed and negative.     PAST MEDICAL HISTORY     Past Medical History:   Diagnosis Date    Hypertension     Pulmonary embolism (HCC) 09/2020       SURGICAL HISTORY     No past surgical history on file.    CURRENT MEDICATIONS       Previous Medications    AMLODIPINE (NORVASC) 10 MG TABLET    Take 1 tablet by mouth daily    APIXABAN (ELIQUIS PO)    Take 5 mg by mouth 2 times daily    ASPIRIN 81 MG CHEWABLE TABLET    Take 1 tablet by mouth daily    FUROSEMIDE (LASIX) 20 MG TABLET    Take 1 tablet by mouth daily    HYDRALAZINE (APRESOLINE) 25 MG TABLET    Take 1 tablet by mouth every 8 hours Hold for SBP<150    LISINOPRIL (PRINIVIL;ZESTRIL) 40 MG TABLET    Take 1 tablet by mouth daily    MECLIZINE (ANTIVERT) 12.5 MG TABLET    Take 1 tablet by mouth 3 times daily as needed for Dizziness  ALLERGIES     Shrimp (diagnostic), Strawberry, Chocolate, and Peach [prunus persica]    FAMILY HISTORY     No family history on file.     SOCIAL HISTORY       Social History     Socioeconomic History    Marital status: Widowed   Tobacco Use    Smoking status: Never     Passive exposure: Never    Smokeless tobacco: Never   Vaping Use    Vaping Use: Never used   Substance and Sexual Activity    Drug use: Never    Sexual activity: Never       SCREENINGS         Glasgow Coma Scale  Eye Opening: Spontaneous  Best Verbal Response: Oriented  Best Motor Response: Obeys commands  Glasgow Coma Scale Score: 15                     CIWA Assessment  BP: (!) 181/86  Pulse: 73                 PHYSICAL EXAM    (up to 7 for level 4, 8 or more for level 5)     ED Triage Vitals [11/25/21 0953]   BP Temp Temp Source Pulse Respirations SpO2 Height Weight - Scale   (!) 164/99 97.6 F (36.4 C) Oral 73 18 100 % 5\' 4"  (1.626 m) 143 lb 4.8 oz (65 kg)       Physical Exam  Vitals and nursing note reviewed.   Constitutional:       General: She is not  in acute distress.     Appearance: Normal appearance.   HENT:      Head: Normocephalic and atraumatic.      Mouth/Throat:      Mouth: Mucous membranes are moist.   Eyes:      Extraocular Movements: Extraocular movements intact.      Conjunctiva/sclera: Conjunctivae normal.   Cardiovascular:      Rate and Rhythm: Normal rate and regular rhythm.      Heart sounds: Normal heart sounds. No murmur heard.  Pulmonary:      Effort: Pulmonary effort is normal.      Breath sounds: Normal breath sounds. No wheezing.   Abdominal:      General: Bowel sounds are normal.      Palpations: Abdomen is soft.      Tenderness: There is no abdominal tenderness.   Musculoskeletal:         General: Normal range of motion.      Cervical back: Normal range of motion and neck supple. No tenderness.   Skin:     General: Skin is warm and dry.   Neurological:      General: No focal deficit present.      Mental Status: She is alert and oriented to person, place, and time.   Psychiatric:         Mood and Affect: Mood normal.         Behavior: Behavior normal.         Thought Content: Thought content normal.         Judgment: Judgment normal.       PROCEDURES:  Unless otherwise noted below, none     Procedures    DIAGNOSTIC RESULTS     EKG: All EKG's are interpreted by the Emergency Department Physician who either signs or Co-signs this chart in the absence of a cardiologist.  RADIOLOGY:   Non-plain film images such as CT, Ultrasound and MRI are read by the radiologist. Plain radiographic images are visualized and preliminarily interpreted by the emergency physician with the below findings:    Interpretation per the Radiologist below, if available at the time of this note:    No orders to display         ED BEDSIDE ULTRASOUND:   Performed by ED Physician - none    LABS:  Labs Reviewed   CBC WITH AUTO DIFFERENTIAL - Abnormal; Notable for the following components:       Result Value    MCV 80.2 (*)     MCHC 36.1 (*)     Neutrophils % 33.1 (*)      Lymphocytes 49.5 (*)     All other components within normal limits   LIPASE - Abnormal; Notable for the following components:    Lipase 12 (*)     All other components within normal limits   COMPREHENSIVE METABOLIC PANEL   POC URINALYSIS, CHEMISTRY       All other labs were within normal range or not returned as of this dictation.    EMERGENCY DEPARTMENT COURSE/REASSESSMENT and MDM:   Vitals:    Vitals:    11/25/21 0953 11/25/21 1141   BP: (!) 164/99 (!) 181/86   Pulse: 73    Resp: 18    Temp: 97.6 F (36.4 C)    TempSrc: Oral    SpO2: 100% 100%   Weight: 65 kg    Height: 5\' 4"  (1.626 m)        ED Course:    ED Course as of 11/25/21 1259   Mon Nov 25, 2021   1232 Specific Gravity, Urine, POC: 1.015 [SF]   1232 Nitrate, UA POC: Negative [SF]   1232 Leukocytes, UA: Negative [SF]   1232 Lipase(!): 12 [SF]   1232 Hemoglobin Quant: 12.9 [SF]   1232 Hematocrit: 35.7 [SF]   1240 I have found no signs of urinary tract infection or other etiology for the patient's pain.  On exam, she has no significant abdominal tenderness to palpation.  Of note, she has had an ongoing work-up for both this pain and for neuropathy in her left heel.  She recently saw her doctor for this abdominal pain and had an ultrasound.  So I would send her back to her regular physician but in the meantime I will make sure that she is on an H2 blocker just in case this is GERD or something similar [SF]      ED Course User Index  [SF] 1233, MD       Medical Decision Making  Given the location of pain, I think it is important to make sure that the patient does not have a urinary tract infection.  I will also check other lab work and reexamine the patient    Amount and/or Complexity of Data Reviewed  Labs: ordered. Decision-making details documented in ED Course.    Risk  Prescription drug management.        CONSULTS:  None      FINAL IMPRESSION      1. Lower abdominal pain          DISPOSITION/PLAN   DISPOSITION Decision To Discharge  11/25/2021 12:49:55 PM      PATIENT REFERRED TO:  11/27/2021, NP-C  [address] [place]  [phone]          [phone]  [place]  [phone]            DISCHARGE MEDICATIONS:  New Prescriptions    FAMOTIDINE (PEPCID) 20 MG TABLET    Take 1 tablet by mouth 2 times daily     Controlled Substances Monitoring:     No flowsheet data found.    (Please note that portions of this note were completed with a voice recognition program.  Efforts were made to edit the dictations but occasionally words are mis-transcribed.)    Rayann Heman, MD (electronically signed)  Attending Emergency Physician           Rayann Heman, MD  11/25/21 1259

## 2021-11-25 NOTE — Discharge Instructions (Addendum)
Take Pepcid 20 mg daily and Maalox 1 tablespoon with meals and at bedtime both over-the-counter. Return for worsening or right lower belly pain, fever, vomiting, or other concerns

## 2022-01-07 ENCOUNTER — Ambulatory Visit: Admit: 2022-01-07 | Discharge: 2022-01-07 | Payer: MEDICARE | Attending: Family | Primary: Family Health

## 2022-01-07 DIAGNOSIS — M25461 Effusion, right knee: Secondary | ICD-10-CM

## 2022-01-07 NOTE — Progress Notes (Signed)
Allison [name] (DOB:  31-Jan-1929) is a 88 y.o. female, here for evaluation of the following chief complaint(s):  Leg Pain (Right knee has nodule 24 hrs and left knee is painful 2 days  )         ASSESSMENT/PLAN:  1. Swelling of joint of right knee  2. Left leg pain  3. History of DVT (deep vein thrombosis)    No results found for any visits on 01/07/22.      No follow-ups on file.       Discussed with patient and patient's granddaughter unfortunately we are unable to rule out DVT here in office due to lack of ultrasound capabilities.  Given the contralateral complaints have lower suspicion for DVT although patient's granddaughter states her previous DVT was asymptomatic.  The right knee where there is swelling there is no associated pain redness warmth or distal swelling.  The left leg which is painful sounds like this more of a chronic and not acute problem although it does seem to be worsening and this is where her previous DVT was found.  She is still on 2.5 mg of Eliquis which is also reassuring.  We discussed risk versus benefit of taking her to the ER for ultrasound versus reaching out to her primary care tomorrow to see if they can see her in order as an outpatient imaging.  Patient and granddaughter aware risk of not going to the ER could result in PE if there was a DVT there but again I discussed with them I have lower suspicion of this.  They are going to wait and reach back out to her primary care tomorrow.  Aware if pain is worsening, any distal swelling of the legs or warmth that they should be evaluated in the ER sooner.      Subjective   SUBJECTIVE/OBJECTIVE:    Leg Pain         Patient here with her granddaughter concerned about possible DVT.  About 24 hours ago patient noted some swelling on the medial aspect of her right knee which she showed her granddaughter.  The knee has not been red, warm or painful.  Denies any distal swelling.  Over the last 48 hours she has been having increasing left leg  pain that is worse overnight.  Patient's granddaughter states she is waking up at night screaming in pain.  Sounds like this is more of a chronic problem that she is taking gabapentin for this.  Granddaughter also started her back on her magnesium to see if this may help with the nighttime leg pain and presumed cramping.  Her daughter was concerned given increasing pain and leg swelling on the opposite leg that she may have a DVT.  She has history of DVT and pulmonary embolism.  She is on Eliquis 2.5.  Dose was weaned about 3 months ago.  Patient denies any redness warmth heat to the legs.  No history of gout.  Denies any injury or falls.  It sounds like her previous DVT was found incidentally in the left leg as she was going to the hospital for something else.  Patient's granddaughter states it did not present with any pain redness or swelling.  Allergies   Allergen Reactions    Shrimp (Diagnostic) Anaphylaxis    Strawberry Anaphylaxis    Chocolate     Peach [Prunus Persica]      Current Outpatient Medications   Medication Sig Dispense Refill    omeprazole (PRILOSEC) 20 MG delayed  release capsule       meclizine (ANTIVERT) 12.5 MG tablet Take 1 tablet by mouth 3 times daily as needed for Dizziness 30 tablet 0    hydrALAZINE (APRESOLINE) 25 MG tablet Take 1 tablet by mouth every 8 hours Hold for SBP<150 90 tablet 0    lisinopril (PRINIVIL;ZESTRIL) 40 MG tablet Take 1 tablet by mouth daily 30 tablet 0    amLODIPine (NORVASC) 10 MG tablet Take 1 tablet by mouth daily 30 tablet 0    Apixaban (ELIQUIS PO) Take 5 mg by mouth 2 times daily      furosemide (LASIX) 20 MG tablet Take 1 tablet by mouth daily      aspirin 81 MG chewable tablet Take 1 tablet by mouth daily      famotidine (PEPCID) 20 MG tablet Take 1 tablet by mouth 2 times daily (Patient not taking: Reported on 01/07/2022) 60 tablet 0     No current facility-administered medications for this visit.     Past Medical History:   Diagnosis Date    Hypertension      Pulmonary embolism ([place]) 09/2020         Review of Systems  Negative other than noted in HPI         Objective   BP (!) 178/76 (Site: Left Upper Arm, Position: Sitting, Cuff Size: Medium Adult)   Pulse 72   Temp 98.3 F (36.8 C) (Oral)   Resp 18   Ht 5\' 6"  (1.676 m)   Wt 128 lb 11.2 oz (58.4 kg)   SpO2 99%   BMI 20.77 kg/m     Physical Exam  HENT:      Head: Normocephalic and atraumatic.   Cardiovascular:      Rate and Rhythm: Normal rate and regular rhythm.      Pulses: Normal pulses.      Heart sounds: Normal heart sounds.   Pulmonary:      Effort: Pulmonary effort is normal.      Breath sounds: Normal breath sounds.   Musculoskeletal:        Legs:       Comments: Medial aspect of the right knee with some mild palpable swelling, no redness, warmth or tenderness, negative Homans' sign, no distal swelling; left leg with full range of motion without any pain, patient did have some mild tenderness to the left heel upon palpation, no redness or swelling noted   Skin:     Capillary Refill: Capillary refill takes less than 2 seconds.   Neurological:      General: No focal deficit present.      Mental Status: She is alert.   Psychiatric:         Mood and Affect: Mood normal.                  An electronic signature was used to authenticate this note.    --Rebeca Alert, APRN - NP

## 2022-01-07 NOTE — Telephone Encounter (Signed)
Formatting of this note might be different from the original.  Patients knee is swollen and tender. She is not wanting to walk on it. She has not fallen. Started over the last couple of days. Granddaughter is taking her to [facility] for evaluation and treatment.   Electronically signed by Edrick Oh, LPN at 27/78/2423 53:61 AM EDT

## 2022-01-07 NOTE — Telephone Encounter (Signed)
Formatting of this note might be different from the original.  Symptom Call For Nurse  Information obtained from caller:  Please describe your symptom(s) and when did they start: tabitha calling because patient is experiencing pain in her knee and area.  Asking if order can be place, concerned and wants to rule out blood clot  Electronically signed by Karie Soda at 01/07/2022 10:41 AM EDT

## 2022-01-13 ENCOUNTER — Emergency Department: Admit: 2022-01-14 | Payer: MEDICARE | Primary: Family Health

## 2022-01-13 DIAGNOSIS — J189 Pneumonia, unspecified organism: Secondary | ICD-10-CM

## 2022-01-13 NOTE — Discharge Instructions (Signed)
Please call to follow-up with your primary care doctor preferably within the next 24 to 48 hours.  Please return to the emergency room with any acute questions, concerns, worsening status or any delay to your outpatient care.  Please understand you received a general medical exam pertaining to your chief complaint.  This does not replace a thorough work-up done by your primary care provider.  Please ask your primary care provider about further evaluation and review of all labs and imaging that were conducted today and ask about any work-up needed for any abnormality or incidental finding.  Please understand it is your responsibility to follow-up with your primary care provider and follow-up on any and all lab work and imaging.  Failure to follow-up could lead to significant morbidity if not mortality.  It is always a pleasure to take care of you at this hospital we strongly encourage you to return to the emergency room if you have any questions or concerns or any worsening status.    If you do not have a primary care doctor please contact [phone].

## 2022-01-13 NOTE — ED Notes (Signed)
Patient A&O4, Respirations non labored, no distress noted, speech clear, daughter in room and updated on plan. Daughter and patient verbalized understanding.       Dayle Points, RN  01/13/22 2324

## 2022-01-13 NOTE — ED Provider Notes (Signed)
[facility] EMERGENCY DEPT  EMERGENCY DEPARTMENT ENCOUNTER      Pt Name: Allison [name]  MRN: [id number]  Birthdate 12/16/1928  Date of evaluation: 01/13/2022  Provider: Olin Hauser, MD    CHIEF COMPLAINT       Chief Complaint   Patient presents with    Cough     Patient is brought to ER for a sore throat headache and cough over the past 5 days.  Grand daughter says the cough keeps her up and night and she can't sleep.         HISTORY OF PRESENT ILLNESS    Allison [name] is a 88 y.o. female presents with cough.  Family report patient had URI-like symptoms a couple days ago including sore throat cough headache which have since resolved.  Patient still has a productive cough.  They have tried over-the-counter medications without any significant relief.  Denies any fever.      Nursing Notes were reviewed.    REVIEW OF SYSTEMS       Review of Systems   Constitutional:  Negative for activity change, appetite change, chills and fever.   HENT:  Negative for congestion, ear pain and rhinorrhea.    Eyes:  Negative for pain and redness.   Respiratory:  Positive for cough. Negative for chest tightness, shortness of breath and wheezing.    Cardiovascular:  Negative for chest pain and leg swelling.   Gastrointestinal:  Negative for abdominal pain, diarrhea, nausea and vomiting.   Genitourinary:  Negative for dysuria and hematuria.   Musculoskeletal:  Negative for neck pain and neck stiffness.   Skin:  Negative for color change and rash.   Allergic/Immunologic: Negative for immunocompromised state.   Neurological:  Negative for seizures, numbness and headaches.   Hematological:  Negative for adenopathy.       Except as noted above the remainder of the review of systems was reviewed and negative.       PAST MEDICAL HISTORY     Past Medical History:   Diagnosis Date    DVT (deep venous thrombosis) (HCC)     Hypertension     Pulmonary embolism (HCC) 09/2020       SURGICAL HISTORY     History reviewed. No pertinent surgical  history.    CURRENT MEDICATIONS       Previous Medications    AMLODIPINE (NORVASC) 10 MG TABLET    Take 1 tablet by mouth daily    APIXABAN (ELIQUIS PO)    Take 5 mg by mouth 2 times daily    ASPIRIN 81 MG CHEWABLE TABLET    Take 1 tablet by mouth daily    FAMOTIDINE (PEPCID) 20 MG TABLET    Take 1 tablet by mouth 2 times daily    FUROSEMIDE (LASIX) 20 MG TABLET    Take 1 tablet by mouth daily    HYDRALAZINE (APRESOLINE) 25 MG TABLET    Take 1 tablet by mouth every 8 hours Hold for SBP<150    LISINOPRIL (PRINIVIL;ZESTRIL) 40 MG TABLET    Take 1 tablet by mouth daily    MECLIZINE (ANTIVERT) 12.5 MG TABLET    Take 1 tablet by mouth 3 times daily as needed for Dizziness    OMEPRAZOLE (PRILOSEC) 20 MG DELAYED RELEASE CAPSULE           ALLERGIES     Shrimp (diagnostic), Strawberry, Chocolate, and Peach [prunus persica]    FAMILY HISTORY     History reviewed. No pertinent family  history.     SOCIAL HISTORY       Social History     Socioeconomic History    Marital status: Widowed     Spouse name: None    Number of children: None    Years of education: None    Highest education level: None   Tobacco Use    Smoking status: Never     Passive exposure: Never    Smokeless tobacco: Never   Vaping Use    Vaping Use: Never used   Substance and Sexual Activity    Drug use: Never    Sexual activity: Never       SCREENINGS         Glasgow Coma Scale  Eye Opening: Spontaneous  Best Verbal Response: Oriented  Best Motor Response: Obeys commands  Glasgow Coma Scale Score: 15                     CIWA Assessment  BP: (!) 145/70  Pulse: 65                 PHYSICAL EXAM    (up to 7 for level 4, 8 or more for level 5)     ED Triage Vitals [01/13/22 2049]   BP Temp Temp Source Pulse Respirations SpO2 Height Weight - Scale   136/66 99.5 F (37.5 C) Oral 68 16 99 % 5\' 6"  (1.676 m) 128 lb 12 oz (58.4 kg)       Physical Exam  Vitals and nursing note reviewed.   Constitutional:       General: She is not in acute distress.     Appearance: Normal  appearance. She is normal weight. She is not toxic-appearing.   HENT:      Head: Normocephalic and atraumatic.      Right Ear: External ear normal.      Left Ear: External ear normal.      Nose: Nose normal.   Eyes:      Extraocular Movements: Extraocular movements intact.      Pupils: Pupils are equal, round, and reactive to light.   Cardiovascular:      Rate and Rhythm: Normal rate and regular rhythm.      Heart sounds: Normal heart sounds. No murmur heard.     No gallop.   Pulmonary:      Effort: Pulmonary effort is normal. No respiratory distress.      Breath sounds: Normal breath sounds. No wheezing or rales.   Abdominal:      General: Abdomen is flat. There is no distension.   Musculoskeletal:         General: Normal range of motion.      Cervical back: Normal range of motion.   Neurological:      General: No focal deficit present.      Mental Status: She is alert and oriented to person, place, and time. Mental status is at baseline.      Motor: No weakness.   Psychiatric:         Mood and Affect: Mood normal.         Behavior: Behavior normal.         Thought Content: Thought content normal.         Judgment: Judgment normal.         DIAGNOSTIC RESULTS       PROCEDURES:  Unless otherwise noted below, none     Procedures    EKG: All  EKG's are interpreted by the Emergency Department Physician who either signs or Co-signs this chart in the absence of a cardiologist.    LABS:  Labs Reviewed   COMPREHENSIVE METABOLIC PANEL - Abnormal; Notable for the following components:       Result Value    Glucose 120 (*)     All other components within normal limits   CBC WITH AUTO DIFFERENTIAL - Abnormal; Notable for the following components:    Hematocrit 33.9 (*)     Monocytes 15.9 (*)     Absolute Mono # 1.3 (*)     All other components within normal limits   COVID-19 & INFLUENZA COMBO [facility])    Narrative:     Is this test for diagnosis or screening?->Diagnosis of ill patient  Symptomatic for COVID-19 as defined  by CDC?->Yes  Date of Symptom Onset->01/09/22  Hospitalized for COVID-19?->No  Admitted to ICU for COVID-19?->No  Employed in healthcare setting?->No  Resident in a congregate (group) care setting?->No  Pregnant?->No  Previously tested for COVID-19?->Yes       All other labs were within normal range or not returned as of this dictation.    RADIOLOGY:   Non-plain film images such as CT, Ultrasound and MRI are read by the radiologist. Plain radiographic images are visualized and preliminarily interpreted by the emergency physician with the below findings:    Interpretation per the Radiologist below, if available at the time of this note:    XR CHEST (2 VW)   Final Result   Impression: Abnormal right medial basilar opacity is present in a region    previously demonstrated bronchiectasis and is of concern for pneumonia.      POCUS    (Results Pending)         EMERGENCY DEPARTMENT COURSE/REASSESSMENT and MDM:   Vitals:    Vitals:    01/13/22 2049 01/13/22 2230 01/13/22 2331   BP: 136/66 (!) 140/86 (!) 145/70   Pulse: 68 65 65   Resp: 16 14 16    Temp: 99.5 F (37.5 C)     TempSrc: Oral     SpO2: 99% 100% 100%   Weight: 58.4 kg     Height: 5\' 6"  (1.676 m)           Medical Decision Making  Amount and/or Complexity of Data Reviewed  Labs: ordered. Decision-making details documented in ED Course.  Radiology: ordered and independent interpretation performed. Decision-making details documented in ED Course.    Risk  Prescription drug management.        ED Course:    ED Course as of 01/13/22 2345   Mon Jan 13, 2022   5934 88 year old female who presents with cough.    Patient arrives emergency department stable vital signs.  On exam patient is well-appearing no acute distress.  Lungs are bilaterally.  [facility] today's restraint obtained prior to me seeing patient notable for abnormal right medial basilar opacity concerning for pneumonia.  COVID flu negative.  Labs obtained grossly normal.  Curb 65 S only  one-point for age.  Patient be given first doses of antibiotics here Augmentin and azithromycin.  Instructed family on antibiotic as well as PCP follow-up.  Patient discharged in stable condition. [HB]      ED Course User Index  [HB] 82, MD         CONSULTS:  None    FINAL IMPRESSION      1. Pneumonia due to infectious organism, unspecified  laterality, unspecified part of lung          DISPOSITION/PLAN   DISPOSITION Decision To Discharge 01/13/2022 11:09:38 PM      PATIENT REFERRED TO:  No follow-up provider specified.    DISCHARGE MEDICATIONS:  New Prescriptions    AMOXICILLIN-CLAVULANATE (AUGMENTIN) 875-125 MG PER TABLET    Take 1 tablet by mouth 2 times daily for 7 days    AZITHROMYCIN (ZITHROMAX) 250 MG TABLET    Take 2 tablets by mouth daily for 2 days    FLUCONAZOLE (DIFLUCAN) 150 MG TABLET    Take 1 tablet by mouth once for 1 dose       Controlled Substances Monitoring:          No data to display                (Please note that portions of this note were completed with a voice recognition program.  Efforts were made to edit the dictations but occasionally words are mis-transcribed.)    Olin Hauser, MD (electronically signed)  Attending Emergency Physician           Olin Hauser, MD  01/13/22 [phone]

## 2022-01-14 ENCOUNTER — Inpatient Hospital Stay
Admit: 2022-01-14 | Discharge: 2022-01-14 | Disposition: A | Discharge status: Home / Self Care | Payer: MEDICARE | Attending: Emergency Medicine

## 2022-01-14 LAB — CBC WITH AUTO DIFFERENTIAL
Absolute Baso #: 0 10*3/uL (ref 0.0–0.2)
Absolute Eos #: 0.1 10*3/uL (ref 0.0–0.5)
Absolute Lymph #: 1.4 10*3/uL (ref 1.0–3.2)
Absolute Mono #: 1.3 10*3/uL — ABNORMAL HIGH (ref 0.3–1.0)
Basophils %: 0.3 % (ref 0.0–2.0)
Eosinophils %: 0.9 % (ref 0.0–7.0)
Hematocrit: 33.9 % — ABNORMAL LOW (ref 34.0–47.0)
Hemoglobin: 12.1 g/dL (ref 11.5–15.7)
Immature Grans (Abs): 0.03 10*3/uL (ref 0.00–0.06)
Immature Granulocytes: 0.4 % (ref 0.0–0.6)
Lymphocytes: 17.4 % (ref 15.0–45.0)
MCH: 28.9 pg (ref 27.0–34.5)
MCHC: 35.7 g/dL (ref 32.0–36.0)
MCV: 81.1 fL (ref 81.0–99.0)
MPV: 10.2 fL (ref 7.2–13.2)
Monocytes: 15.9 % — ABNORMAL HIGH (ref 4.0–12.0)
NRBC Absolute: 0 10*3/uL (ref 0.000–0.012)
NRBC Automated: 0 % (ref 0.0–0.2)
Neutrophils %: 65.1 % (ref 42.0–74.0)
Neutrophils Absolute: 5.2 10*3/uL (ref 1.6–7.3)
Platelets: 197 10*3/uL (ref 140–440)
RBC: 4.18 x10e6/mcL (ref 3.60–5.20)
RDW: 13.2 % (ref 11.0–16.0)
WBC: 7.9 10*3/uL (ref 3.8–10.6)

## 2022-01-14 LAB — COMPREHENSIVE METABOLIC PANEL
ALT: 10 U/L (ref 0–35)
AST: 17 U/L (ref 0–35)
Albumin/Globulin Ratio: 1.03 (ref 1.00–2.70)
Albumin: 3.5 g/dL (ref 3.5–5.2)
Alk Phosphatase: 59 U/L (ref 35–117)
Anion Gap: 11 mmol/L (ref 2–17)
BUN: 12 mg/dL (ref 8–23)
CO2: 28 mmol/L (ref 22–29)
Calcium: 8.8 mg/dL (ref 8.8–10.2)
Chloride: 102 mmol/L (ref 98–107)
Creatinine: 0.8 mg/dL (ref 0.5–1.0)
Est, Glom Filt Rate: 70 mL/min/1.73m (ref >=60)
Globulin: 3.4 g/dL (ref 1.9–4.4)
Glucose: 120 mg/dL — ABNORMAL HIGH (ref 70–99)
Osmolaliy Calculated: 282 mOsm/kg (ref 270–287)
Potassium: 3.7 mmol/L (ref 3.5–5.3)
Sodium: 141 mmol/L (ref 135–145)
Total Bilirubin: 0.4 mg/dL (ref 0.00–1.20)
Total Protein: 6.9 g/dL (ref 6.4–8.3)

## 2022-01-14 LAB — COVID-19 & INFLUENZA COMBO (LIAT HOSPITAL)
INFLUENZA A: NOT DETECTED
INFLUENZA B: NOT DETECTED
SARS-CoV-2: NOT DETECTED

## 2022-01-14 MED ORDER — AZITHROMYCIN 250 MG PO TABS
250 MG | ORAL_TABLET | Freq: Every day | ORAL | 0 refills | Status: AC
Start: 2022-01-14 — End: 2022-01-15

## 2022-01-14 MED ORDER — AZITHROMYCIN 250 MG PO TABS
250 MG | Freq: Once | ORAL | Status: AC
Start: 2022-01-14 — End: 2022-01-13
  Administered 2022-01-14: 03:00:00 500 mg via ORAL

## 2022-01-14 MED ORDER — AMOXICILLIN-POT CLAVULANATE 875-125 MG PO TABS
875-125 MG | ORAL_TABLET | Freq: Two times a day (BID) | ORAL | 0 refills | Status: AC
Start: 2022-01-14 — End: 2022-01-20

## 2022-01-14 MED ORDER — FLUCONAZOLE 150 MG PO TABS
150 MG | ORAL_TABLET | Freq: Once | ORAL | 0 refills | Status: AC
Start: 2022-01-14 — End: 2022-01-13

## 2022-01-14 MED ORDER — AMOXICILLIN-POT CLAVULANATE 875-125 MG PO TABS
875-125 MG | Freq: Once | ORAL | Status: AC
Start: 2022-01-14 — End: 2022-01-13
  Administered 2022-01-14: 03:00:00 1 via ORAL

## 2022-01-14 MED FILL — AZITHROMYCIN 250 MG PO TABS: 250 MG | ORAL | Qty: 2

## 2022-01-14 MED FILL — AMOXICILLIN-POT CLAVULANATE 875-125 MG PO TABS: 875-125 MG | ORAL | Qty: 1

## 2022-08-06 ENCOUNTER — Emergency Department: Admit: 2022-08-06 | Payer: MEDICARE | Primary: Family Health

## 2022-08-06 ENCOUNTER — Inpatient Hospital Stay
Admit: 2022-08-06 | Discharge: 2022-08-10 | Disposition: A | Discharge status: Home Health | Payer: Medicare Other | Admitting: Internal Medicine

## 2022-08-06 DIAGNOSIS — L89613 Pressure ulcer of right heel, stage 3: Secondary | ICD-10-CM

## 2022-08-06 DIAGNOSIS — S91302A Unspecified open wound, left foot, initial encounter: Secondary | ICD-10-CM

## 2022-08-06 DIAGNOSIS — I96 Gangrene, not elsewhere classified: Secondary | ICD-10-CM

## 2022-08-06 LAB — COMPREHENSIVE METABOLIC PANEL
ALT: 13 U/L (ref 0–35)
AST: 16 U/L (ref 0–35)
Albumin/Globulin Ratio: 1.12 (ref 1.00–2.70)
Albumin: 3.8 g/dL (ref 3.5–5.2)
Alk Phosphatase: 71 U/L (ref 35–117)
Anion Gap: 10 mmol/L (ref 2–17)
BUN: 16 mg/dL (ref 8–23)
CO2: 28 mmol/L (ref 22–29)
Calcium: 8.7 mg/dL (ref 8.5–10.7)
Chloride: 103 mmol/L (ref 98–107)
Creatinine: 0.8 mg/dL (ref 0.5–1.0)
Est, Glom Filt Rate: 69 mL/min/1.73m (ref >=60)
Globulin: 3.4 g/dL (ref 1.9–4.4)
Glucose: 121 mg/dL — ABNORMAL HIGH (ref 70–99)
OSMOLALITY CALCULATED: 284 mOsm/kg (ref 270–287)
Potassium: 4.4 mmol/L (ref 3.5–5.3)
Sodium: 141 mmol/L (ref 135–145)
Total Bilirubin: 0.2 mg/dL (ref 0.00–1.20)
Total Protein: 7.2 g/dL (ref 5.7–8.3)

## 2022-08-06 LAB — CBC WITH AUTO DIFFERENTIAL
Absolute Baso #: 0 10*3/uL (ref 0.0–0.2)
Absolute Eos #: 0.2 10*3/uL (ref 0.0–0.5)
Absolute Lymph #: 1.6 10*3/uL (ref 1.0–3.2)
Absolute Mono #: 0.6 10*3/uL (ref 0.3–1.0)
Basophils %: 0.3 % (ref 0.0–2.0)
Eosinophils %: 2.3 % (ref 0.0–7.0)
Hematocrit: 32.6 % — ABNORMAL LOW (ref 34.0–47.0)
Hemoglobin: 11.4 g/dL — ABNORMAL LOW (ref 11.5–15.7)
Immature Grans (Abs): 0.02 10*3/uL (ref 0.00–0.06)
Immature Granulocytes %: 0.3 % (ref 0.0–0.6)
Lymphocytes: 22.8 % (ref 15.0–45.0)
MCH: 28.3 pg (ref 27.0–34.5)
MCHC: 35 g/dL (ref 32.0–36.0)
MCV: 80.9 fL — ABNORMAL LOW (ref 81.0–99.0)
MPV: 10 fL (ref 7.2–13.2)
Monocytes: 8.8 % (ref 4.0–12.0)
NRBC Absolute: 0 10*3/uL (ref 0.000–0.012)
NRBC Automated: 0 % (ref 0.0–0.2)
Neutrophils %: 65.5 % (ref 42.0–74.0)
Neutrophils Absolute: 4.5 10*3/uL (ref 1.6–7.3)
Platelets: 248 10*3/uL (ref 140–440)
RBC: 4.03 x10e6/mcL (ref 3.60–5.20)
RDW: 13.4 % (ref 11.0–16.0)
WBC: 6.8 10*3/uL (ref 3.8–10.6)

## 2022-08-06 LAB — IRON AND TIBC
Iron % Saturation: 16 % — ABNORMAL LOW (ref 20–40)
Iron: 32 ug/dL — ABNORMAL LOW (ref 37–145)
TIBC: 200 ug/dL — ABNORMAL LOW (ref 250–450)
UIBC: 168 ug/dL (ref 112.0–347.0)

## 2022-08-06 LAB — FERRITIN: Ferritin: 125 ng/mL (ref 13.0–150.0)

## 2022-08-06 LAB — LACTATE, SEPSIS: Lactate, Sepsis: 1.8 mmol/L (ref 0.5–2.0)

## 2022-08-06 MED ORDER — SODIUM CHLORIDE 0.9 % IV SOLN (ADD-VANTAGE)
0.9 % | INTRAVENOUS | Status: DC
Start: 2022-08-06 — End: 2022-08-10
  Administered 2022-08-07 – 2022-08-09 (×3): 1000 mg via INTRAVENOUS

## 2022-08-06 MED ORDER — ONDANSETRON HCL 4 MG/2ML IJ SOLN
42 MG/2ML | Freq: Four times a day (QID) | INTRAMUSCULAR | Status: DC | PRN
Start: 2022-08-06 — End: 2022-08-10

## 2022-08-06 MED ORDER — PIPERACILLIN SOD-TAZOBACTAM SO 3.375 (3-0.375) G IV SOLR
3.3753-0.375 (3-0.375) g | Freq: Three times a day (TID) | INTRAVENOUS | Status: AC
Start: 2022-08-06 — End: 2022-08-08
  Administered 2022-08-07 – 2022-08-08 (×6): 3375 mg via INTRAVENOUS

## 2022-08-06 MED ORDER — AMLODIPINE BESYLATE 10 MG PO TABS
10 MG | Freq: Every day | ORAL | Status: DC
Start: 2022-08-06 — End: 2022-08-10
  Administered 2022-08-07 – 2022-08-10 (×4): 10 mg via ORAL

## 2022-08-06 MED ORDER — POTASSIUM CHLORIDE CRYS ER 20 MEQ PO TBCR
20 MEQ | ORAL | Status: DC | PRN
Start: 2022-08-06 — End: 2022-08-10

## 2022-08-06 MED ORDER — PIPERACILLIN SOD-TAZOBACTAM SO 4.5 (4-0.5) G IV SOLR
4.5 | Freq: Once | INTRAVENOUS | Status: AC
Start: 2022-08-06 — End: 2022-08-06
  Administered 2022-08-06: 20:00:00 4500 mg via INTRAVENOUS

## 2022-08-06 MED ORDER — ACETAMINOPHEN 650 MG RE SUPP
650 MG | Freq: Four times a day (QID) | RECTAL | Status: DC | PRN
Start: 2022-08-06 — End: 2022-08-10
  Administered 2022-08-10: 09:00:00 650 mg via RECTAL

## 2022-08-06 MED ORDER — ONDANSETRON 4 MG PO TBDP
4 MG | Freq: Three times a day (TID) | ORAL | Status: DC | PRN
Start: 2022-08-06 — End: 2022-08-10

## 2022-08-06 MED ORDER — FUROSEMIDE 20 MG PO TABS
20 MG | Freq: Every day | ORAL | Status: DC
Start: 2022-08-06 — End: 2022-08-10
  Administered 2022-08-07 – 2022-08-10 (×4): 20 mg via ORAL

## 2022-08-06 MED ORDER — SODIUM CHLORIDE 0.9 % IV BOLUS
0.9 | Freq: Once | INTRAVENOUS | Status: DC
Start: 2022-08-06 — End: 2022-08-06

## 2022-08-06 MED ORDER — SODIUM CHLORIDE 0.9 % IV BOLUS
0.9 | Freq: Once | INTRAVENOUS | Status: AC
Start: 2022-08-06 — End: 2022-08-06
  Administered 2022-08-06: 20:00:00 500 mL via INTRAVENOUS

## 2022-08-06 MED ORDER — SODIUM CHLORIDE 0.9 % IV SOLN
0.9 % | INTRAVENOUS | Status: DC | PRN
Start: 2022-08-06 — End: 2022-08-10

## 2022-08-06 MED ORDER — ASPIRIN 81 MG PO CHEW
81 MG | Freq: Every day | ORAL | Status: DC
Start: 2022-08-06 — End: 2022-08-10
  Administered 2022-08-07 – 2022-08-10 (×4): 81 mg via ORAL

## 2022-08-06 MED ORDER — POTASSIUM BICARB-CITRIC ACID 20 MEQ PO TBEF
20 MEQ | ORAL | Status: DC | PRN
Start: 2022-08-06 — End: 2022-08-10

## 2022-08-06 MED ORDER — CEFEPIME HCL 2 G IV SOLR
2 | Freq: Once | INTRAVENOUS | Status: DC
Start: 2022-08-06 — End: 2022-08-06

## 2022-08-06 MED ORDER — NORMAL SALINE FLUSH 0.9 % IV SOLN
0.9 % | Freq: Two times a day (BID) | INTRAVENOUS | Status: DC
Start: 2022-08-06 — End: 2022-08-10
  Administered 2022-08-07 – 2022-08-10 (×7): 10 mL via INTRAVENOUS
  Administered 2022-08-10: 40 mL via INTRAVENOUS

## 2022-08-06 MED ORDER — VANCOMYCIN HCL 1 G IV SOLR
1 | Freq: Once | INTRAVENOUS | Status: AC
Start: 2022-08-06 — End: 2022-08-06
  Administered 2022-08-06: 21:00:00 1000 mg via INTRAVENOUS

## 2022-08-06 MED ORDER — NORMAL SALINE FLUSH 0.9 % IV SOLN
0.9 % | INTRAVENOUS | Status: DC | PRN
Start: 2022-08-06 — End: 2022-08-10

## 2022-08-06 MED ORDER — POTASSIUM CHLORIDE 10 MEQ/100ML IV SOLN
10100 MEQ/0ML | INTRAVENOUS | Status: DC | PRN
Start: 2022-08-06 — End: 2022-08-10

## 2022-08-06 MED ORDER — MAGNESIUM SULFATE 2 GM/50ML IV SOLN
250 GM/50ML | INTRAVENOUS | Status: DC | PRN
Start: 2022-08-06 — End: 2022-08-10

## 2022-08-06 MED ORDER — LISINOPRIL 20 MG PO TABS
20 MG | Freq: Every day | ORAL | Status: DC
Start: 2022-08-06 — End: 2022-08-10
  Administered 2022-08-07 – 2022-08-10 (×4): 40 mg via ORAL

## 2022-08-06 MED ORDER — POLYETHYLENE GLYCOL 3350 17 G PO PACK
17 g | Freq: Every day | ORAL | Status: DC | PRN
Start: 2022-08-06 — End: 2022-08-10

## 2022-08-06 MED ORDER — GABAPENTIN 100 MG PO CAPS
100 MG | Freq: Every evening | ORAL | Status: DC
Start: 2022-08-06 — End: 2022-08-10
  Administered 2022-08-07 – 2022-08-10 (×4): 100 mg via ORAL

## 2022-08-06 MED ORDER — IOPAMIDOL 61 % IV SOLN
61 | Freq: Once | INTRAVENOUS | Status: AC | PRN
Start: 2022-08-06 — End: 2022-08-06
  Administered 2022-08-06: 20:00:00 100 mL via INTRAVENOUS

## 2022-08-06 MED ORDER — ACETAMINOPHEN 325 MG PO TABS
325 MG | Freq: Four times a day (QID) | ORAL | Status: DC | PRN
Start: 2022-08-06 — End: 2022-08-10
  Administered 2022-08-08 – 2022-08-10 (×3): 650 mg via ORAL

## 2022-08-06 MED FILL — VANCOMYCIN HCL 1 G IV SOLR: 1 g | INTRAVENOUS | Qty: 1000

## 2022-08-06 MED FILL — PIPERACILLIN SOD-TAZOBACTAM SO 4.5 (4-0.5) G IV SOLR: 4.5 (4-0.5) g | INTRAVENOUS | Qty: 4500

## 2022-08-06 NOTE — ED Notes (Signed)
Report given to [place], Charity fundraiser. Hold transport as of now per RN. Will request in 10-15 min

## 2022-08-06 NOTE — Plan of Care (Signed)
Received report. Pt sitting upright in bed watching television. NAD. Assessment complete. Will continue to monitor.

## 2022-08-06 NOTE — Telephone Encounter (Signed)
REQUESTING TO SPEAK WITH DR. Sofie Hartigan, KAREN ANSWERED CALL AND STATED DR. MARINO DOES NOT GO TO [facility] AND SUGGESTS CALLING DR. Elita Quick, DR. Sandrea Hughs, OR DR. RALPH    KANDICE ANSWERED CALL AND WILL SPEAK WITH DR. Rayna Sexton AND GIVE A CALL BACK.    URGENT ED CONSULT  [facility]    WOUND ON RIGHT HEEL - POSSIBLE GANGRENE INFECTION  ROOM [address]

## 2022-08-06 NOTE — H&P (Signed)
[facility] Hospitalist Service     Hospitalist H & P     Name: Allison [name]   DOB: 1928-09-22 (Age: 89 y.o.)   Date of Admission: 08/06/22    Primary Care Provider: Arneta Cliche, NP-C  Attending: Shona Needles, MD    Chief Complaint: Right heel pain     History of Present Illness  Allison [name] is a 89 y.o. female with a PMHx significant for hx DVT and PE, HTN, peripheral neuropathy, who presented to the hospital with her granddaughter secondary to a nonhealing right heel ulceration.  Patient states that about 3 weeks ago she noticed the ulcer, and it has not gotten better over that.  Time.  She does have pain especially in the right leg at nighttime.  She states it becomes sharp and stabbing pain.  She usually is able to get up and ambulate around the house with her walker, but is unclear how sedentary she is overall.  She does have home health services coming out to her house as well as a home health aide that assists with bathing.  She denies fevers, chills, chest pain, shortness of breath, abdominal pain, diarrhea.  She has been able to ambulate on the right foot, and again endorses that most of her pain is at nighttime.  She has noted that it has had increased drainage recently.    On arrival to the ER patient had stable vitals.  She was afebrile.  No leukocytosis, no lactate.  Renal function appropriate.  No left shift on CBC.  Cultures were obtained, and patient was given some fluids.  Started on vancomycin and Zosyn.  CT scan was performed, but awaiting formal read.  Hospitalist service was called as there were concerns for need for wound debridement.    Patient no longer smokes.  She does not drink alcohol.  Her granddaughter is at bedside, and states her last dose of Eliquis was this morning.  She is compliant with her medications.  They have been following with [facility], and had an ultrasound recently.  Reviewed chart, it appears that this was done in January 2024.  Preliminary comments on absence  of DVT, but no arterial studies.         Past Medical History     Past Medical History:   Diagnosis Date    DVT (deep venous thrombosis) (HCC)     Hypertension     Pulmonary embolism (HCC) 09/2020        Past Surgical History   History reviewed. No pertinent surgical history.     Allergies   Shrimp (diagnostic), Strawberry, Chocolate, and Peach [prunus persica]       Medications     Current Outpatient Medications   Medication Instructions    amLODIPine (NORVASC) 10 mg, Oral, DAILY    Apixaban (ELIQUIS PO) 5 mg, Oral, 2 TIMES DAILY    aspirin 81 mg, Oral, DAILY    famotidine (PEPCID) 20 mg, Oral, 2 TIMES DAILY    furosemide (LASIX) 20 mg, Oral, DAILY    hydrALAZINE (APRESOLINE) 25 mg, Oral, EVERY 8 HOURS SCHEDULED, Hold for SBP<150    lisinopril (PRINIVIL;ZESTRIL) 40 mg, Oral, DAILY    meclizine (ANTIVERT) 12.5 mg, Oral, 3 TIMES DAILY PRN    omeprazole (PRILOSEC) 20 MG delayed release capsule No dose, route, or frequency recorded.         Family History   History reviewed. No pertinent family history.     Social History  Social History     Socioeconomic History    Marital status: Widowed     Spouse name: Not on file    Number of children: Not on file    Years of education: Not on file    Highest education level: Not on file   Occupational History    Not on file   Tobacco Use    Smoking status: Never     Passive exposure: Never    Smokeless tobacco: Never   Vaping Use    Vaping Use: Never used   Substance and Sexual Activity    Alcohol use: Not on file    Drug use: Never    Sexual activity: Never   Other Topics Concern    Not on file   Social History Narrative    Not on file     Social Determinants of Health     Financial Resource Strain: Not on file   Food Insecurity: Not on file   Transportation Needs: Not on file   Physical Activity: Not on file   Stress: Not on file   Social Connections: Not on file   Intimate Partner Violence: Not on file   Housing Stability: Not on file        Review of Systems (positives  bolded, otherwise negative)   ROS negative unless otherwise specified in the HPI     Physical Exam     Vitals:    08/06/22 1421 08/06/22 1500 08/06/22 1645   BP: 130/70 133/60 (!) 158/71   Pulse: 74  68   Resp: 16  16   Temp: 98 F (36.7 C)     TempSrc: Oral     SpO2: 98% 99% 96%   Weight: 58.1 kg (128 lb)     Height: 1.676 m (5\' 6" )         General - awake and responsive, cooperative with examination   Head - NCAT  ENT - no LAD, moist mucous membranes   Eyes - Non-icteric sclera, no subconjunctival pallor   Cardio - RRR, without MRG  Pulm - CTA bilaterally, no wheezing, normal respiratory effort   GI - soft, ND NT with normoactive BS, absent guarding or rebound tenderness  Vascular - good peripheral pulses bilateral UE / LE, no edema. Intact R dorsalis pedis.   MS - symmetrical muscle strength bilateral upper and lower extremities  Skin - no rashes. Patient with eschar over right heel with loss of soft tissue. Boggy tissue around ulcer wall, but no drainage or surrounding cellulitis. Very minimal/trace edema at the ankle.   Neuro - no focal weakness, no slurred speech, no facial droop  Psychiatric - appropriate mood and affect       Labs and Imaging     Labs:   Hematologic/Coags Chemistries   Recent Labs     08/06/22  1537   WBC 6.8   HGB 11.4*   HCT 32.6*   PLT 248     Lab Results   Component Value Date/Time    PROT 7.2 08/06/2022 03:37 PM    ALBUMIN 3.8 08/06/2022 03:37 PM     No components found for: "HGBA1C"  Lab Results   Component Value Date/Time    INR 1.1 10/09/2020 04:23 PM    PROTIME 14.3 10/09/2020 04:23 PM     Lab Results   Component Value Date/Time    APTT 164.9 10/10/2020 03:39 AM     No results found for: "DDIMER"   Recent Labs  08/06/22  1537   NA 141   K 4.4   CL 103   CO2 28   BUN 16   CREATININE 0.8   ALBUMIN 3.8   BILITOT 0.20   ALKPHOS 71   AST 16   ALT 13     No results for input(s): "GLU" in the last 72 hours.  No results found for: "CPK", "CKMB", "TROPONINI"  No results found for:  "IRON", "FERRITIN"     Inflammatory/Respiratory Diabetes   No results found for: "CRP"  No results found for: "ESR"  ABGs:  No results found for: "PHART", "PO2ART", "HCO3", "PCO2ART"   Lab Results   Component Value Date/Time    CREATININE 0.8 08/06/2022 03:37 PM                CT FOOT RIGHT W CONTRAST    (Results Pending)   Vascular duplex lower extremity arteries bilateral    (Results Pending)         Past Microbiologic History (if applicable)  No results for input(s): "SDES", "CULTURE" in the last 72 hours.     Assessment & Plan     Non-healing right heel ulceration  Patient has some tenderness to palpation. Had some bloody drainage at home, but she is on Eliquis. Vitals stable, lactate normal, no leukocytosis or left shift. I think it is reasonable to empirically treat for an infection pending imaging.   -Continue on Vanc and Zosyn for now, but she does not have any risk factors for pseudomonas or MRSA. Will obtain MRSA PCR and dc vanc as appropriate.  -Follow-up CT right foot. If there is bony breakdown, will get MRI.  -Have reached out to podiatry regarding possible need for debridement. Appreciate their recommendations.  -Will hold eliquis while determining need to for bedside vs OR debridement.  -Arterial doppler  -Will ask for wound care nursing consult pending podiatry recommendations.   -Will ask for PT/OT evaluation tomorrow as well.     HTN  -Continue home lisinopril, lasix, amlodipine    Hx DVT and PE  -Hold home eliquis as above.    Nightly rest leg pain  -Arterial doppler as above. She has intact peripheral pulses as above.  -Will check iron studies as this could be the culprit with her mildly low microcytic anemia. Especially with her chronic eliquis use she could have some mild chronic blood loss contributing to this.  -Continue home gabapentin.    Hyperglycemia  -Check A1C    Full code. Discussed with patient on admission and she would like to be full code.  Obs  SCDs      Shona Needles,  MD  08/06/2022 5:13 PM  [facility] Hospitalist Service    This note reflects my/our clinical thought processes and is intended primarily for the exchange of information between healthcare providers.  It is not written primarily to communicate with the patient or family directly, which takes place in person at the bedside.  This note may contain sensitive information, including substance use, mental health, and consideration of sensitive, serious, or other "do not miss" diagnoses, which is further discussed at the bedside.

## 2022-08-06 NOTE — Telephone Encounter (Signed)
Per Dr. Rayna Sexton, he recommends patient have a vascular consult for the gangrene. He is not able to see the patient today or tomorrow due to his surgery schedule.

## 2022-08-06 NOTE — ED Provider Notes (Addendum)
[facility] EMERGENCY DEPT  EMERGENCY DEPARTMENT ENCOUNTER      Pt Name: Allison [name]  MRN: [id number]  Birthdate [birth year]  Date of evaluation: 08/06/2022  Provider: Lieutenant Diego, MD    CHIEF COMPLAINT       Chief Complaint   Patient presents with    Wound Check     Pt getting seen for wound on right heel by home health nurse. Goddaughter states last night home health nurse states she noticed drainage and told her it should be checked out at ED for possible infection.          HISTORY OF PRESENT ILLNESS    HPI  Patient presents with a foul-smelling full-thickness ulcer p.o.  Discovered that a by home health nurse.  She is getting wound care for decubitus ulcer.  This does not appear infected.  She does have some surrounding erythema looks like early cellulitis and there is disappearance of the soft tissues remaining over the right heel bone.  She has 2+ pedal pulses no fevers or chills or constitutional symptoms.  Nursing Notes were reviewed.    REVIEW OF SYSTEMS       Review of Systems    Except as noted above the remainder of the review of systems was reviewed and negative.       PAST MEDICAL HISTORY     Past Medical History:   Diagnosis Date    DVT (deep venous thrombosis) (HCC)     Hypertension     Pulmonary embolism (HCC) 09/2020       SURGICAL HISTORY     No past surgical history on file.    CURRENT MEDICATIONS       Previous Medications    AMLODIPINE (NORVASC) 10 MG TABLET    Take 1 tablet by mouth daily    APIXABAN (ELIQUIS PO)    Take 5 mg by mouth 2 times daily    ASPIRIN 81 MG CHEWABLE TABLET    Take 1 tablet by mouth daily    FAMOTIDINE (PEPCID) 20 MG TABLET    Take 1 tablet by mouth 2 times daily    FUROSEMIDE (LASIX) 20 MG TABLET    Take 1 tablet by mouth daily    HYDRALAZINE (APRESOLINE) 25 MG TABLET    Take 1 tablet by mouth every 8 hours Hold for SBP<150    LISINOPRIL (PRINIVIL;ZESTRIL) 40 MG TABLET    Take 1 tablet by mouth daily    MECLIZINE (ANTIVERT) 12.5 MG TABLET    Take 1 tablet by mouth  3 times daily as needed for Dizziness    OMEPRAZOLE (PRILOSEC) 20 MG DELAYED RELEASE CAPSULE           ALLERGIES     Shrimp (diagnostic), Strawberry, Chocolate, and Peach [prunus persica]    FAMILY HISTORY     No family history on file.     SOCIAL HISTORY       Social History     Socioeconomic History    Marital status: Widowed   Tobacco Use    Smoking status: Never     Passive exposure: Never    Smokeless tobacco: Never   Vaping Use    Vaping Use: Never used   Substance and Sexual Activity    Drug use: Never    Sexual activity: Never           PHYSICAL EXAM    (up to 7 for level 4, 8 or more for level 5)  ED Triage Vitals [08/06/22 1421]   BP Temp Temp Source Pulse Respirations SpO2 Height Weight - Scale   130/70 98 F (36.7 C) Oral 74 16 98 % 1.676 m ( ) 58.1 kg (128 lb)       Physical Exam  Constitutional:       General: She is not in acute distress.     Appearance: Normal appearance. She is not ill-appearing, toxic-appearing or diaphoretic.   Eyes:      Conjunctiva/sclera: Conjunctivae normal.   Cardiovascular:      Rate and Rhythm: Normal rate.      Pulses: Normal pulses.      Heart sounds: No murmur heard.  Pulmonary:      Effort: Pulmonary effort is normal. No respiratory distress.      Breath sounds: Normal breath sounds. No stridor. No wheezing.   Abdominal:      General: Abdomen is flat. There is no distension.      Palpations: Abdomen is soft.      Tenderness: There is no abdominal tenderness. There is no guarding.   Musculoskeletal:         General: Swelling and tenderness present. No deformity.      Cervical back: No tenderness.      Right lower leg: No edema.      Left lower leg: No edema.      Comments: Right heel and foot soft tissue swelling   Skin:     Capillary Refill: Capillary refill takes less than 2 seconds.      Comments: Patient has significant breakdown of the large area approximately 3-1/2 to 4 cm of the right heel with gangrenous / ulcerated appearance    Neurological:       General: No focal deficit present.      Mental Status: She is alert and oriented to person, place, and time. Mental status is at baseline.      Sensory: No sensory deficit.      Motor: No weakness.         DIAGNOSTIC RESULTS     EKG: All EKG's are interpreted by the Emergency Department Physician who either signs or Co-signs this chart in the absence of a cardiologist.      RADIOLOGY:   Non-plain film images such as CT, Ultrasound and MRI are read by the radiologist. Plain radiographic images are visualized and preliminarily interpreted by the emergency physician with the below findings:        Interpretation per the Radiologist below, if available at the time of this note:    CT FOOT RIGHT W CONTRAST    (Results Pending)         LABS:  Labs Reviewed   CBC WITH AUTO DIFFERENTIAL - Abnormal; Notable for the following components:       Result Value    Hemoglobin 11.4 (*)     Hematocrit 32.6 (*)     MCV 80.9 (*)     All other components within normal limits   CULTURE, BLOOD 1   CULTURE, BLOOD 2   LACTATE, SEPSIS   LACTATE, SEPSIS   COMPREHENSIVE METABOLIC PANEL       All other labs were within normal range or not returned as of this dictation.    EMERGENCY DEPARTMENT COURSE/REASSESSMENT and MDM:   Vitals:    Vitals:    08/06/22 1421   BP: 130/70   Pulse: 74   Resp: 16   Temp: 98 F (36.7 C)   TempSrc:  Oral   SpO2: 98%   Weight: 58.1 kg (128 lb)   Height: 1.676 m ( )       ED Course:       MDM     Amount and/or Complexity of Data Reviewed  Clinical lab tests: reviewed      Patient with extensive skin breakdown now what appears to be gangrenous wound to the right heel.  This is in a precarious location, will start treating her aggressively with IV antibiotics and fluids.  Will get a CT image, will discuss with hospital medicine to bring her and have consultation with either podiatry or Ortho as well as wound care given location type of wound that she has here.    Discussed case with Dr. Reche Dixon, hospitalist.  Labs pending. Plan is for IV abx, and hospitalist to evaluate patient for admission.     Will switch cefepime to zosyn per Dr. William Hamburger request.         CONSULTS:  IP CONSULT TO PHARMACY    PROCEDURES:  Unless otherwise noted below, none     Procedures    Dr. Aundria Rud agreed to admit the patient and treat the infected heel ulcer.    FINAL IMPRESSION      1. Non-healing open wound of left heel    2. Pressure injury of skin of right heel, unspecified injury stage          DISPOSITION/PLAN   DISPOSITION Decision To Admit 08/06/2022 02:59:27 PM      PATIENT REFERRED TO:  No follow-up provider specified.    DISCHARGE MEDICATIONS:  New Prescriptions    No medications on file     Controlled Substances Monitoring:          No data to display                (Please note that portions of this note were completed with a voice recognition program.  Efforts were made to edit the dictations but occasionally words are mis-transcribed.)    Lieutenant Diego, MD (electronically signed)  Attending Emergency Physician           Eugenio Hoes, MD  08/06/22 1542       Orian Figueira, Soledad Gerlach, MD  08/06/22 [phone]

## 2022-08-06 NOTE — Progress Notes (Signed)
Drug: Vancomycin    Pharmacokinetic target: AUC24 (range) 400-600 mg/L.hr        [name] is a(n) 89 years  old female initiating Vancomycin for non-healing right heel ulceration      Recent measured serum creatinine values:   08/06/2022 15:37 0.8 mg/dL      Assessment:    Analysis using [employer] gives the following patient-specific pharmacokinetic parameters:    CL: 1.91 L/hr   V: 48.5 L   T1/2: 18.9 hours    At this time we recommend a regimen of 1000 mg IV every 24 hours, which is predicted to result in a steady-state trough of 13.9 mg/L and AUC24 of 456 mg/L.hr.      Recommendations:        - Vancomycin 1000 mg IV every 24 hours        - Obtain Vancomycin level [ when appropriate        - Continue to monitor serum creatinine      Allison [name]

## 2022-08-07 ENCOUNTER — Observation Stay: Admit: 2022-08-07 | Discharge: 2022-08-25 | Payer: MEDICARE | Primary: Family Health

## 2022-08-07 LAB — CBC WITH AUTO DIFFERENTIAL
Absolute Baso #: 0 10*3/uL (ref 0.0–0.2)
Absolute Eos #: 0.3 10*3/uL (ref 0.0–0.5)
Absolute Lymph #: 1.9 10*3/uL (ref 1.0–3.2)
Absolute Mono #: 0.7 10*3/uL (ref 0.3–1.0)
Basophils %: 0.5 % (ref 0.0–2.0)
Eosinophils %: 5.4 % (ref 0.0–7.0)
Hematocrit: 28.3 % — ABNORMAL LOW (ref 34.0–47.0)
Hemoglobin: 10 g/dL — ABNORMAL LOW (ref 11.5–15.7)
Immature Grans (Abs): 0.01 10*3/uL (ref 0.00–0.06)
Immature Granulocytes %: 0.2 % (ref 0.0–0.6)
Lymphocytes: 29.3 % (ref 15.0–45.0)
MCH: 28.4 pg (ref 27.0–34.5)
MCHC: 35.3 g/dL (ref 32.0–36.0)
MCV: 80.4 fL — ABNORMAL LOW (ref 81.0–99.0)
MPV: 10 fL (ref 7.2–13.2)
Monocytes: 11.4 % (ref 4.0–12.0)
NRBC Absolute: 0 10*3/uL (ref 0.000–0.012)
NRBC Automated: 0 % (ref 0.0–0.2)
Neutrophils %: 53.2 % (ref 42.0–74.0)
Neutrophils Absolute: 3.4 10*3/uL (ref 1.6–7.3)
Platelets: 218 10*3/uL (ref 140–440)
RBC: 3.52 x10e6/mcL — ABNORMAL LOW (ref 3.60–5.20)
RDW: 13.4 % (ref 11.0–16.0)
WBC: 6.3 10*3/uL (ref 3.8–10.6)

## 2022-08-07 LAB — BASIC METABOLIC PANEL W/ REFLEX TO MG FOR LOW K
Anion Gap: 10 mmol/L (ref 2–17)
BUN: 11 mg/dL (ref 8–23)
CO2: 25 mmol/L (ref 22–29)
Calcium: 8.5 mg/dL (ref 8.5–10.7)
Chloride: 106 mmol/L (ref 98–107)
Creatinine: 0.6 mg/dL (ref 0.5–1.0)
Est, Glom Filt Rate: 84 mL/min/1.73m (ref >=60)
Glucose: 84 mg/dL (ref 70–99)
OSMOLALITY CALCULATED: 278 mOsm/kg (ref 270–287)
Potassium: 3.7 mmol/L (ref 3.5–5.3)
Sodium: 140 mmol/L (ref 135–145)

## 2022-08-07 LAB — MRSA BY PCR: MRSA SCREEN RT-PCR: DETECTED — AB

## 2022-08-07 LAB — CULTURE, BLOOD 1

## 2022-08-07 LAB — LACTATE, SEPSIS: Lactate, Sepsis: 2 mmol/L (ref 0.5–2.0)

## 2022-08-07 MED FILL — MONOJECT FLUSH SYRINGE 0.9 % IV SOLN: 0.9 % | INTRAVENOUS | Qty: 10

## 2022-08-07 MED FILL — FUROSEMIDE 20 MG PO TABS: 20 MG | ORAL | Qty: 1

## 2022-08-07 MED FILL — GABAPENTIN 100 MG PO CAPS: 100 MG | ORAL | Qty: 1

## 2022-08-07 MED FILL — ASPIRIN LOW STRENGTH 81 MG PO CHEW: 81 MG | ORAL | Qty: 1

## 2022-08-07 MED FILL — AMLODIPINE BESYLATE 10 MG PO TABS: 10 MG | ORAL | Qty: 1

## 2022-08-07 MED FILL — PIPERACILLIN SOD-TAZOBACTAM SO 3.375 (3-0.375) G IV SOLR: 3.375 (3-0.375) g | INTRAVENOUS | Qty: 3375

## 2022-08-07 MED FILL — VANCOMYCIN HCL 1 G IV SOLR: 1 g | INTRAVENOUS | Qty: 1000

## 2022-08-07 MED FILL — LISINOPRIL 20 MG PO TABS: 20 MG | ORAL | Qty: 2

## 2022-08-07 NOTE — Plan of Care (Signed)
Problem: Discharge Planning  Goal: Discharge to home or other facility with appropriate resources  08/07/2022 2139 by Sandria Bales, RN  Outcome: Progressing  08/07/2022 1856 by Yetta Flock, RN  Outcome: Progressing     Problem: Skin/Tissue Integrity  Goal: Absence of new skin breakdown  Description: 1.  Monitor for areas of redness and/or skin breakdown  2.  Assess vascular access sites hourly  3.  Every 4-6 hours minimum:  Change oxygen saturation probe site  4.  Every 4-6 hours:  If on nasal continuous positive airway pressure, respiratory therapy assess nares and determine need for appliance change or resting period.  08/07/2022 2139 by Sandria Bales, RN  Outcome: Progressing  08/07/2022 1856 by Yetta Flock, RN  Outcome: Progressing     Problem: ABCDS Injury Assessment  Goal: Absence of physical injury  08/07/2022 2139 by Sandria Bales, RN  Outcome: Progressing  08/07/2022 1856 by Yetta Flock, RN  Outcome: Progressing     Problem: Safety - Adult  Goal: Free from fall injury  08/07/2022 2139 by Sandria Bales, RN  Outcome: Progressing  08/07/2022 1856 by Yetta Flock, RN  Outcome: Progressing     Problem: Pain  Goal: Verbalizes/displays adequate comfort level or baseline comfort level  08/07/2022 2139 by Sandria Bales, RN  Outcome: Progressing  08/07/2022 1856 by Yetta Flock, RN  Outcome: Progressing     Problem: Neurosensory - Adult  Goal: Achieves stable or improved neurological status  08/07/2022 2139 by Sandria Bales, RN  Outcome: Progressing  08/07/2022 1856 by Yetta Flock, RN  Outcome: Progressing  Goal: Achieves maximal functionality and self care  08/07/2022 2139 by Sandria Bales, RN  Outcome: Progressing  08/07/2022 1856 by Yetta Flock, RN  Outcome: Progressing     Problem: Skin/Tissue Integrity - Adult  Goal: Incisions, wounds, or drain sites healing without S/S of infection  08/07/2022 2139 by Sandria Bales, RN  Outcome:  Progressing  08/07/2022 1856 by Yetta Flock, RN  Outcome: Progressing     Problem: Musculoskeletal - Adult  Goal: Return mobility to safest level of function  08/07/2022 2139 by Sandria Bales, RN  Outcome: Progressing  08/07/2022 1856 by Yetta Flock, RN  Outcome: Progressing  Goal: Return ADL status to a safe level of function  08/07/2022 2139 by Sandria Bales, RN  Outcome: Progressing  08/07/2022 1856 by Yetta Flock, RN  Outcome: Progressing     Problem: Genitourinary - Adult  Goal: Absence of urinary retention  08/07/2022 2139 by Sandria Bales, RN  Outcome: Progressing  08/07/2022 1856 by Yetta Flock, RN  Outcome: Progressing

## 2022-08-07 NOTE — Wound Image (Signed)
WOC Services consulted for evaluation and treatment of   Patient Admit date:08/06/22  Age:89  Diagnosis:right heel ulcer  History: DVT, PE, HTN, Peripheral Neuropathy    Patient awake, resting in bed. On waffle overlay. Offloading boots provided. Left heel assessed and free from any pressure related injury.  Right heel with soft eschar, mild erythema. No odor or strange drainage noted.  Sacrum with large area of scar tissue, scattered areas of shallow full thickness skin loss to left sacrum.     08/07/22 1031   Wound 08/06/22 Sacrum   Date First Assessed/Time First Assessed: 08/06/22 1440   Present on Original Admission: Yes  Primary Wound Type: Pressure Injury  Location: Sacrum   Wound Image    Wound Etiology Pressure Stage 3   Dressing Status New dressing applied   Wound Cleansed Soap and water   Dressing/Treatment Triad hydro/zinc oxide-based hydrophilic paste   Wound Length (cm) 3 cm   Wound Width (cm) 6 cm   Wound Depth (cm) 0.1 cm   Wound Surface Area (cm^2) 18 cm^2   Change in Wound Size % (l*w) -140   Wound Volume (cm^3) 1.8 cm^3   Wound Assessment Granulation tissue   Drainage Amount Scant (moist but unmeasurable)   Drainage Description Serous   Odor None   Peri-wound Assessment Hypopigmented   Wound 08/06/22 Heel Right   Date First Assessed/Time First Assessed: 08/06/22 1448   Present on Original Admission: Yes  Primary Wound Type: Pressure Injury  Location: Heel  Wound Location Orientation: Right   Wound Image    Wound Etiology Pressure Unstageable   Dressing Status New dressing applied   Wound Cleansed Vashe   Dressing/Treatment Moist to moist   Wound Length (cm) 4 cm   Wound Width (cm) 5 cm   Wound Depth (cm) 0.1 cm   Wound Surface Area (cm^2) 20 cm^2   Change in Wound Size % (l*w) -344.44   Wound Volume (cm^3) 2 cm^3   Wound Assessment Eschar moist   Drainage Amount Scant (moist but unmeasurable)   Odor None   Peri-wound Assessment Blanchable erythema     Plan:  Sacrum:Triad Cream qshift  Right heel:  Vashe damp gauze dressing twice daily, await Podiatry evaluation  Offload heels  Waffle overlay  Juven Wound Healing Supplement

## 2022-08-07 NOTE — Progress Notes (Signed)
Acute Care Physical Therapy Evaluation   Observation   (PTA/PT Visit Days : 1)  Time In: 1324 Time Out: 1355 (31 minutes)  Acknowledge Orders  PT Charge Capture    Admitting Diagnosis:    Heel ulcer, right, with necrosis of muscle (HCC) [L97.413]  Pressure injury of skin of right heel, unspecified injury stage [L89.619]  Non-healing open wound of left heel [S91.302A]      Reason for Referral: Unsteadiness on feet (R26.81)  Payor: MEDICARE / Plan: MEDICARE PART A AND B / Product Type: *No Product type* /   SUBJECTIVE   Allison [name] is a 89 y.o. female admitted with Heel ulcer, right, with necrosis of muscle (HCC). She  has a past medical history of DVT (deep venous thrombosis) (HCC), Hypertension, and Pulmonary embolism (HCC).  She also  has no past surgical history on file.    Subjective: Pt is agreeable to PT.  Granddtr present.     Additional Pertinent History: Pt admitted c R heel ulcer, also sacral breakdown noted by WOCN.      Social Environment Prior Level of Function   Lives With: Family  Type of Home: House  Home Layout: One level  Home Equipment: Gilmer Mor, Environmental consultant, rolling  Receives Help From: Family, Home health  ADL Assistance: Needs assistance  Homemaking Assistance: Needs assistance  Homemaking Responsibilities: No  Ambulation Assistance: Needs assistance  Transfer Assistance: Needs assistance  Active Driver: No  Occupation: Unemployed   History of Falls: No    Comments:        OBJECTIVE     Vital Signs/Pain Lines/Drains/Precautions   Vital Signs  No vitals taken this session     Pain   0/10 Peripheral IV 08/06/22 Right Antecubital (Active)       External Urinary Catheter (Active)       Precautions/Restrictions  Restrictions/Precautions  Restrictions/Precautions: Fall Risk (Simultaneous filing. User may not have seen previous data.)     Orientation/Cognition Vision/Hearing   Overall Cognitive Status: Exceptions  Arousal/Alertness: Appropriate responses to stimuli  Following Commands: Follows one step  commands consistently  Insights: Fully aware of deficits  Initiation: Requires cues for some  Overall Orientation Status: Impaired  Orientation Level: Oriented to person, Oriented to place, Oriented to situation (pt was not able to tell year, but did know month)            Objective Assessment  Gross Assessment   AROM: Generally decreased, functional  Strength: Generally decreased, functional  Sensation: Intact     Balance   Sitting - Static: Good  Sitting - Dynamic: Fair  Standing - Static: Fair;Poor  Standing - Dynamic: Fair;Poor      FUNCTIONAL ACTIVITY     Bed Mobility   Bed Mobility Training: Yes  Supine to Sit: Stand-by assistance;Additional time  Sit to Supine: Minimum assistance;Assist X1  Scooting: Minimum assistance;Assist X1     Transfer Training   Transfer Training: Yes  Sit to Stand: Minimum assistance;Assist X1 (pt pulling on walker first time, cued to push from bed)  Stand to Sit: Contact-guard assistance;Minimum assistance;Assist X1     Ambulation      Gait Training: Yes  Overall Level of Assistance: Minimum assistance;Assist X1  Distance (ft): 20 Feet  Assistive Device: Walker, rolling;Gait belt  Base of Support: Narrowed  Speed/Cadence: Slow      Therapeutic Activity (16 Minutes) CPT 97530: Therapeutic activity included Supine to Sit, Sit to Supine, Scooting, Transfer Training, and Ambulation on level ground to improve functional  Activity tolerance, Balance, Mobility, and Strength.  Safety: Type of Devices: Bed alarm in place;Left in bed;Call light within reach;Nurse notified     Education: Education Given To: Patient;Family  Education Provided: Role of Designer, industrial/product  Education Method: Verbal  Barriers to Learning: Cognition  Education Outcome: Continued education needed;Demonstrated understanding    [employer] AM-PACT "6 Clicks" Basic Mobility Inpatient Short Form          ASSESSMENT   Assessment:  Pt presents c impaired mobility and R ankle/heel discomfort to  touch.  Pt performed sup-sit c sba and needed additional time for transfer and min A to scoot to EOB.  Pt stood c min A and cues for hand placement.  First sit-stand, pt was pulling heavily on RW and PT had to stabilize walker for pt to stand.  Pt also c posterior lean in standing.  Pt ambulated 41ft min A c RW c slow pace and narrow BOS.  Pt incontinent of urine during gait and was unaware.  Pt returned sitting EOB and stood again c min A and pushing from bed c BUE.   Pt has 24hr assist at home and ambulates short distances c RW.  Pt will benefit from continued PT for mobility training.  Recommend return home c caregivers and HHPT.      Evaluation Complexity: Medium Complexity      POST ACUTE RECOMMENDATIONS   Setting: Home Health Therapy    Justification for Recommended Setting: Recommended to help patient obtain maximum functional independence.     Equipment:       Discharge Transportation Recommendations: Stretcher or requires skilled assist for transfer     PLAN   Frequency & Duration: 6 times/week for duration of hospital stay or until stated goals are met, whichever comes first.    Ms. Spofford presents with Good therapy prognosis. Skilled intervention is medically necessary to address the following performance deficits:Decreased functional mobility , Decreased ADL status, Decreased tolerance to work activity, Decreased balance, Increased pain.  Benefits and precautions of physical therapy have been discussed with Ms. Sharol Harness, and the following interventions are recommended: Strengthening, ROM, Balance training, Functional mobility training, Transfer training, ADL/Self-care training, Gait training, Home exercise program, Therapeutic activities, Safety education & training     Goals    Short Term Goals Long Term Goals   Time Frame for Short Term Goals: 4 txs from 08/07/22  Short Term Goal 1: Pt will perform sit-stand cga/min A c improved balance.  Short Term Goal 2: Pt will scoot to EOB sba.  Short Term Goal 3:  Pt will ambulate 30ft cga/min A c RW. Time Frame for Long Term Goals : 8 txs from 08/07/22  Long Term Goal 1: Pt will perform sit-stand sba.  Long Term Goal 2: Pt will ambulate 57ft cga/min A c RW.      Therapist Signature: Allean Found Tishara Pizano    Date: 08/07/2022

## 2022-08-07 NOTE — Consults (Signed)
Consult Note            Date:08/08/2022        Patient Name:Allison [name]     Date of Birth:07-31-1928     Age:89 y.o.    Reason for Consult:      Chief Complaint     Chief Complaint   Patient presents with    Wound Check     Pt getting seen for wound on right heel by home health nurse. Goddaughter states last night home health nurse states she noticed drainage and told her it should be checked out at ED for possible infection.           History Obtained From   patient    History of Present Illness   Patient is a 89y/o F with pmh significant for DVT/PE, HTN, peripheral neuropathy presented to [facility] due to nonhealing right heel ulceration. Patient is seen with her daughter today who reports patient complains of right heel pain x approximately 3-weeks. Upon arrival to ED patient was afebrile without leukocytosis. Patient started on IV antibiotics and CT performed right foot. Podiatry was consulted for further evaluation.    Past Medical History     Past Medical History:   Diagnosis Date    DVT (deep venous thrombosis) (HCC)     Hypertension     Pulmonary embolism (HCC) 09/2020        Past Surgical History   History reviewed. No pertinent surgical history.     Medications     Prior to Admission medications    Medication Sig Start Date End Date Taking? Authorizing Provider   apixaban (ELIQUIS) 2.5 MG TABS tablet Take 1 tablet by mouth 2 times daily   Yes [provider]   meclizine (ANTIVERT) 25 MG tablet Take 1 tablet by mouth 3 times daily as needed for Dizziness   Yes [provider]   diclofenac sodium (VOLTAREN) 1 % GEL Apply 2 g topically daily   Yes [provider]   gabapentin (NEURONTIN) 100 MG capsule Take 1 capsule by mouth at bedtime.   Yes [provider]   omeprazole (PRILOSEC) 20 MG delayed release capsule Take 1 capsule by mouth once as needed (GERD) 12/20/21   [provider]   hydrALAZINE (APRESOLINE) 25 MG tablet Take 1 tablet by mouth every 8 hours Hold for  SBP<150  Patient taking differently: Take 1 tablet by mouth every 8 hours **Hold for SBP<150** 05/06/21   Sorrell, [employer], DO   lisinopril (PRINIVIL;ZESTRIL) 40 MG tablet Take 1 tablet by mouth daily 05/06/21   Sorrell, McKenzie Esther, DO   amLODIPine (NORVASC) 10 MG tablet Take 1 tablet by mouth daily 05/07/21   Sorrell, [employer], DO   furosemide (LASIX) 20 MG tablet Take 1 tablet by mouth daily    [provider]   aspirin 81 MG chewable tablet Take 1 tablet by mouth daily    [provider]        amLODIPine (NORVASC) tablet 10 mg, Daily  aspirin chewable tablet 81 mg, Daily  furosemide (LASIX) tablet 20 mg, Daily  lisinopril (PRINIVIL;ZESTRIL) tablet 40 mg, Daily  gabapentin (NEURONTIN) capsule 100 mg, Nightly  sodium chloride flush 0.9 % injection 5-40 mL, 2 times per day  sodium chloride flush 0.9 % injection 5-40 mL, PRN  0.9 % sodium chloride infusion, PRN  potassium chloride (KLOR-CON M) extended release tablet 40 mEq, PRN   Or  potassium bicarb-citric acid (EFFER-K) effervescent tablet 40  mEq, PRN   Or  potassium chloride 10 mEq/100 mL IVPB (Peripheral Line), PRN  magnesium sulfate 2000 mg in water 50 mL IVPB, PRN  ondansetron (ZOFRAN-ODT) disintegrating tablet 4 mg, Q8H PRN   Or  ondansetron (ZOFRAN) injection 4 mg, Q6H PRN  polyethylene glycol (GLYCOLAX) packet 17 g, Daily PRN  acetaminophen (TYLENOL) tablet 650 mg, Q6H PRN   Or  acetaminophen (TYLENOL) suppository 650 mg, Q6H PRN  piperacillin-tazobactam (ZOSYN) 3,375 mg in sodium chloride 0.9 % 100 mL IVPB (mini-bag), Q8H  vancomycin 1000 MG IVPB in 250 mL NS add-vantage, Q24H        Allergies   Shrimp (diagnostic), Strawberry, Chocolate, and Peach [prunus persica]    Social History     Social History       Tobacco History       Smoking Status  Never      Passive Exposure  Never      Smokeless Tobacco Use  Never              Alcohol History       Alcohol Use Status  Not Asked              Drug Use       Drug Use  Status  Never              Sexual Activity       Sexually Active  Never                    Family History   History reviewed. No pertinent family history.    Review of Systems   A 14-point review of systems was performed and pertinent findings per HPI.    Physical Exam   BP (!) 157/71   Pulse 71   Temp 98 F (36.7 C) (Oral)   Resp 16   Ht 1.676 m (5\' 6" )   Wt 59.2 kg (130 lb 8 oz)   SpO2 98%   BMI 21.06 kg/m   Gen: AOx3, NAD  CARDIOVASCULAR:  Pedal pulses are faintly palpable +1/4 to the dorsalis pedis and  non-palpable to the posterior tibial pulses bilateral foot and ankle. Skin temperature is warm to warm from proximal to distal bilateral lower extremity. Capillary refill time is less than 5 seconds to the distal foot bilateral. There is mild, non-pitting edema present right heel.  MUSCULOSKELETAL  Manual muscle testing is +5/5 to all dorsiflexors, plantar-flexors, invertors, evertors bilateral lower extremity. Range of motion is decreased with ankle joint bilateral. Subtalar joint, midtarsal joint, 1st MTPJ is within normal limits without pain or crepitus. Pain with palpation noted to the right posterior heel periwound.  DERMATOLOGICAL:  Skin is intact left foot and ankle.  Right posterior heel notes eschar wound formation present measuring approximately 2.0cm x 1.5cm. Eschar is dry with mild bogginess appreciated. No periwound erythema present. No active drainage noted.  NEUROLOGICAL:  Sensation is intact however diminished to light touch to the distal lower extremity bilateral.    Labs    CBC:  Recent Labs     08/06/22  1537 08/07/22  0813 08/08/22  0518   WBC 6.8 6.3 6.4   RBC 4.03 3.52* 3.51*   HGB 11.4* 10.0* 9.9*   HCT 32.6* 28.3* 28.6*   MCV 80.9* 80.4* 81.5   RDW 13.4 13.4 13.2   PLT 248 218 211     CHEMISTRIES:  Recent Labs     08/06/22  1537 08/07/22  0813 08/08/22  0518   NA 141 140 141   K 4.4 3.7 3.6   CL 103 106 105   CO2 28 25 26    BUN 16 11 9    CREATININE 0.8 0.6 0.7   GLUCOSE 121* 84 87      PT/INR:No results for input(s): "PROTIME", "INR" in the last 72 hours.  APTT:No results for input(s): "APTT" in the last 72 hours.  LIVER PROFILE:  Recent Labs     08/06/22  1537   AST 16   ALT 13   BILITOT 0.20   ALKPHOS 71       Imaging/Diagnostics   CT FOOT RIGHT W CONTRAST    Result Date: 08/06/2022  Edema and skin thickening superficial to the retrocrural calcaneus consistent with cellulitis. No gas within the soft tissues or organized drainable abscess. Subtle areas of loss of cortex could represent early osteomyelitis of the retrocalcaneus.      Assessment      Hospital Problems             Last Modified POA    * (Principal) Heel ulcer, right, with necrosis of muscle (HCC) 08/06/2022 Yes   Heel ulceration with necrosis, unstageable, right foot.      Plan   -Exam was performed in detail. All labs and imaging studies reviewed in detail.  -CT reviewed. Patient will not tolerate bedside debridement at this time. Eschar stable at this time.   -Rec vascular consultation for arterial intervention. Will await debridement at this time. Will likely need antibiotics until revascularization and debridement performed.  -Daily betadine with DSD or Mepilex to heel.  -Podiatry following.       65-minutes total time was spent preparing to see the patient, reviewing any necessary prior test results, performing a medical appropriate examination and evaluation, counseling and educating the patient and/or time spent ordering medication, tests and/or procedures, in addition to referring and/or communicating with other healthcare professionals and documenting clinical information in electronic or other health-related records.     Electronically signed by Ronnell Guadalajara, DPM on 08/08/22 at 10:15 AM EDT

## 2022-08-07 NOTE — Progress Notes (Signed)
Comprehensive Nutrition Assessment    Type and Reason for Visit:  Initial, Positive Nutrition Screen (Low BMI)    Nutrition Recommendations/Plan:   Continue current diet.  Continue Juven BID (180 kcal, 5 g pro) to promote skin integrity.  Start Ensure plus HP BID (700 kcal, 40 g pro) to promote better intake of calories and protein.  RD to order daily weights.      Malnutrition Assessment:  Malnutrition Status:  At risk for malnutrition (Comment) (d/t increased needs, current diagnoses) (08/07/22 1310)    Context:  Chronic Illness     Findings of the 6 clinical characteristics of malnutrition:  Energy Intake:  Unable to assess  Weight Loss:  No significant weight loss     Body Fat Loss:   (slight body fat loss likely related to age) Triceps, Fat Overlying Ribs   Muscle Mass Loss:  Mild muscle mass loss (likely related to age) Temples (temporalis), Clavicles (pectoralis & deltoids)  Fluid Accumulation:  Mild Extremities  Grip Strength:  Not Performed    Nutrition Assessment:    89 YO F; admitted with chief c/o Right heel pain.   Current diagnoses: Non-healing right heel ulceration, HTN, Hx DVT and PE, Nightly rest leg pain, Hyperglycemia.   Pt with slight confusion during visit today. Pt reports that she "doesnt eat a whole lot" but then reports averaging 75% of her meals. She ate 0% of her breakfast today. No intake recorded in EMR. She endorses early satiety. Denies decreased appetite, N/V, constipation or diarrhea. Last BM 4/23. She states that her UBW is 29-30lb and that she drinks ensure at home. Will order ensure during admission.    Food Insecurity Assessment:  Worried About Programme researcher, broadcasting/film/video in the Last Year -- Never true  Ran Out of Food in the Last Year -- Never true    Suspect 8/14 wt is inaccurate. Will continue to trend and monitor.   Weight History Weight - Scale Weight - Scale Weight Method   08/07/2022 127 lbs 5 oz 57.7 kg Bed scale   08/06/2022 128 lbs 58.1 kg -   01/13/2022 128 lbs 12 oz 58.4 kg -    01/07/2022 128 lbs 11 oz 58.4 kg -   11/25/2021 143 lbs 5 oz 65 kg Stated     Past Medical History:   Diagnosis Date    DVT (deep venous thrombosis) (HCC)     Hypertension     Pulmonary embolism (HCC) 09/2020     History reviewed. No pertinent surgical history.  Lab Results   Component Value Date/Time    NA 140 08/07/2022 08:13 AM    K 3.7 08/07/2022 08:13 AM    CL 106 08/07/2022 08:13 AM    CO2 25 08/07/2022 08:13 AM    BUN 11 08/07/2022 08:13 AM    CREATININE 0.6 08/07/2022 08:13 AM    GLUCOSE 84 08/07/2022 08:13 AM    CALCIUM 8.5 08/07/2022 08:13 AM    MG 2.2 12/29/2017 05:10 AM     No results found for: "TRIG"  No results found for: "POCGLU"  Current Facility-Administered Medications   Medication Dose Route Frequency Provider Last Rate Last Admin    amLODIPine (NORVASC) tablet 10 mg  10 mg Oral Daily Shona Needles, MD   10 mg at 08/07/22 0825    aspirin chewable tablet 81 mg  81 mg Oral Daily Shona Needles, MD   81 mg at 08/07/22 0825    furosemide (LASIX) tablet 20 mg  20 mg Oral Daily Shona Needles, MD   20 mg at 08/07/22 0825    lisinopril (PRINIVIL;ZESTRIL) tablet 40 mg  40 mg Oral Daily Shona Needles, MD   40 mg at 08/07/22 1610    gabapentin (NEURONTIN) capsule 100 mg  100 mg Oral Nightly Shona Needles, MD   100 mg at 08/06/22 2023    sodium chloride flush 0.9 % injection 5-40 mL  5-40 mL IntraVENous 2 times per day Shona Needles, MD   10 mL at 08/07/22 0825    sodium chloride flush 0.9 % injection 5-40 mL  5-40 mL IntraVENous PRN Shona Needles, MD        0.9 % sodium chloride infusion   IntraVENous PRN Shona Needles, MD        potassium chloride (KLOR-CON M) extended release tablet 40 mEq  40 mEq Oral PRN Shona Needles, MD        Or    potassium bicarb-citric acid (EFFER-K) effervescent tablet 40 mEq  40 mEq Oral PRN Shona Needles, MD        Or    potassium chloride 10 mEq/100 mL IVPB (Peripheral Line)  10 mEq IntraVENous PRN Shona Needles, MD        magnesium sulfate 2000  mg in water 50 mL IVPB  2,000 mg IntraVENous PRN Shona Needles, MD        ondansetron (ZOFRAN-ODT) disintegrating tablet 4 mg  4 mg Oral Q8H PRN Shona Needles, MD        Or    ondansetron [facility]) injection 4 mg  4 mg IntraVENous Q6H PRN Shona Needles, MD        polyethylene glycol (GLYCOLAX) packet 17 g  17 g Oral Daily PRN Shona Needles, MD        acetaminophen (TYLENOL) tablet 650 mg  650 mg Oral Q6H PRN Shona Needles, MD        Or    acetaminophen (TYLENOL) suppository 650 mg  650 mg Rectal Q6H PRN Shona Needles, MD        piperacillin-tazobactam (ZOSYN) 3,375 mg in sodium chloride 0.9 % 100 mL IVPB (mini-bag)  3,375 mg IntraVENous Q8H Shona Needles, MD   Stopped at 08/07/22 1258    vancomycin 1000 MG IVPB in 250 mL NS add-vantage  1,000 mg IntraVENous Q24H Shona Needles, MD         Nutrition Related Findings:    RLE nonpitting edema noted. Wound Type: Multiple, Pressure Injury, Stage III, Unstageable (Sacrum, Heel Right)       Current Nutrition Intake & Therapies:    Average Meal Intake: 0%  Average Supplements Intake: Unable to assess (juven ordered today)  ADULT DIET; Regular  ADULT ORAL NUTRITION SUPPLEMENT; Lunch, Dinner; Wound Healing Oral Supplement    Anthropometric Measures:  Height: 167.6 cm (5\' 6" )  Ideal Body Weight (IBW): 130 lbs (59 kg)    Admission Body Weight: 58.1 kg (128 lb 1.4 oz) (not specified)  Current Body Weight: 57.7 kg (127 lb 3.3 oz), 97.9 % IBW. Weight Source: Bed Scale  Current BMI (kg/m2): 20.5        Weight Adjustment For: No Adjustment                 BMI Categories: Underweight (BMI less than 22) age over 34    Estimated Daily Nutrient Needs:  Energy Requirements Based On: Kcal/kg  Weight Used for Energy  Requirements: Current  Energy (kcal/day): 1731 kcla/day (30 kcal/kg) r/t wounds  Weight Used for Protein Requirements: Current  Protein (g/day): 72-87 g/day (1.25-1.5 g/kg) r/t wounds  Method Used for Fluid Requirements: 1 ml/kcal  Fluid (ml/day): 1731  ml/day or per MD    Nutrition Diagnosis:   Increased nutrient needs related to increase demand for energy/nutrients as evidenced by wounds    Nutrition Interventions:   Food and/or Nutrient Delivery: Continue Current Diet, Modify Oral Nutrition Supplement  Nutrition Education/Counseling: Education not appropriate  Coordination of Nutrition Care: Continue to monitor while inpatient, Coordination of Care  Plan of Care discussed with: RN    Goals:     Goals: Meet at least 75% of estimated needs, by next RD assessment, other (specify)  Specify Other Goals: promote skin integrity    Nutrition Monitoring and Evaluation:   Behavioral-Environmental Outcomes: Beliefs and Attitudes, Knowledge or Skill  Food/Nutrient Intake Outcomes: Diet Advancement/Tolerance, Food and Nutrient Intake, Supplement Intake  Physical Signs/Symptoms Outcomes: Biochemical Data, Nutrition Focused Physical Findings, Skin, Weight, Meal Time Behavior, Fluid Status or Edema    Discharge Planning:    Continue Oral Nutrition Supplement, Continue current diet     Haskel Khan, RD  Contact: Thank you for allowing me to participate in the care of this patient.   Please contact your Registered Dietitian with any questions or concerns.     [facility]: [phone] Thelma Barge: [phone]  [facility]: [phone]  [facility]: [phone]  Or message your clinical nutrition team via [facility]

## 2022-08-07 NOTE — Care Coordination-Inpatient (Signed)
08/07/22 1034   Service Assessment   Patient Orientation Alert and Oriented   Cognition Alert   History Provided By Patient   Primary Caregiver Family   Support Systems Family Members   PCP Verified by CM Yes  (Dr. Clydene Pugh)   Prior Functional Level Assistance with the following:;Bathing;Dressing;Toileting;Feeding;Housework;Shopping;Mobility   Current Functional Level Assistance with the following:;Bathing;Toileting;Feeding;Housework;Shopping;Mobility   Can patient return to prior living arrangement Yes   Ability to make needs known: Good   Family able to assist with home care needs: Yes   Would you like for me to discuss the discharge plan with any other family members/significant others, and if so, who? Yes  (Tabitha Wilson [employer] farm manager))   Social/Functional History   Lives With Family   Type of Home House   Home Layout One level   Home Equipment [place], standard;Cane   Receives Help From Family;Home health  [facility] [place])   ADL Assistance Needs assistance   Toileting Needs assistance   Homemaking Assistance Needs assistance   Homemaking Responsibilities No   Transfer Assistance Needs assistance   Active Driver No   Occupation Unemployed   Discharge Planning   Type of Residence House   Patient expects to be discharged to: House     08-07-22 (SG)- Float CM assigned to this pt on this day. Pt admitted for heel ulcer with necrosis of muscle. CM met with pt at bedside to complete the assessment. Pt reports that she lives with family and requires assistance with ADL's. Pt has a walker and a cane at home and is currently open with [facility] services. Dr. Clydene Pugh is her PCP and [facility] is the preferred pharmacy. Updates sent to [facility]. Dispo needs pending clinical course.

## 2022-08-07 NOTE — Consults (Addendum)
ULTRASOUND GUIDED PERIPHERAL IV PLACED    INDICATION: IV Access Required    INSERTED BY: Liz Nykeria Mealing, BSN, RN,      The patient's LEFT arm was prepped aseptically. Local anesthesia was obtained with 0.25mL of 1% lidocaine. Noted RADIAL FOREARM vein compressible.  The RADIAL FOREARM  vein was accessed with an 22 gauge gauge catheter 1.75 inches in length. X 1 attempts. Positive blood return and flushed easily.  Visualized catheter within vein lumen via ultrasound.  10 mL of normal saline flush used to place access.  Stat-Lock and transparent dressing applied.    COMPLICATIONS: None    TOLERATED: well with no complications

## 2022-08-07 NOTE — Progress Notes (Signed)
[facility] Hospitalist Service     Hospitalist Progress Note     PCP: Arneta Cliche, NP-C   Admission Date: 08/06/2022         Subjective     Hemodynamically stable, afebrile. RA.   Noted mild drop in hgb.  CT right foot with evidence for cellulitis adjacent to the ulceration on her heel, but only question of an early osteomyelitis. Arterial doppler with evidence for at least mild arterial occlusive disease on the right. Appropriate on the left.    Patient is doing well today. A little more confused this morning, and did not realize she was in the hospital and not at home. Her grandson is at bedside. She is otherwise doing ok. Noted that she still had some rest pain in both legs at nighttime, but the right was worse than the left. Having some heel pain, but states that it is not bad laying in bed, and notices it most when she is up and walking on it.      Physical Exam   Vitals: BP 128/63   Pulse 60   Temp 97.9 F (36.6 C) (Oral)   Resp 16   Ht 1.676 m (5\' 6" )   Wt 57.7 kg (127 lb 4.8 oz)   SpO2 97%   BMI 20.55 kg/m Temp (24hrs), Avg:98 F (36.7 C), Min:97.7 F (36.5 C), Max:98.3 F (36.8 C)    General - awake and responsive, cooperative with examination. Thinks she is at home.   Cardio - RRR, without MRG  Pulm - CTA bilaterally, no wheezing, normal respiratory effort   GI - soft, ND NT with normoactive BS, absent guarding or rebound tenderness  Vascular - good peripheral pulses bilateral UE / LE, no edema. Intact R dorsalis pedis.   MS - symmetrical muscle strength bilateral upper and lower extremities  Skin - no rashes. Patient with eschar over right heel with loss of soft tissue. Boggy tissue around ulcer wall, but no drainage or surrounding cellulitis. Very minimal/trace edema at the ankle. This is wrapped today and in pressure offloading boots.    Neuro - no focal weakness, no slurred speech, no facial droop  Psychiatric - appropriate mood and affect       Labs and Imaging   Data: I have  personally reviewed all lab results and independently reviewed imaging studies performed in the past 24 hours.    Hematologic/Coags Chemistries   Recent Labs     08/06/22  1537 08/07/22  0813   WBC 6.8 6.3   HGB 11.4* 10.0*   HCT 32.6* 28.3*   PLT 248 218     Lab Results   Component Value Date/Time    PROT 7.2 08/06/2022 03:37 PM    ALBUMIN 3.8 08/06/2022 03:37 PM     No components found for: "HGBA1C"  Lab Results   Component Value Date/Time    INR 1.1 10/09/2020 04:23 PM    PROTIME 14.3 10/09/2020 04:23 PM     Lab Results   Component Value Date/Time    APTT 164.9 10/10/2020 03:39 AM     No results found for: "DDIMER"   Recent Labs     08/06/22  1537 08/07/22  0813   NA 141 140   K 4.4 3.7   CL 103 106   CO2 28 25   BUN 16 11   CREATININE 0.8 0.6   ALBUMIN 3.8  --    BILITOT 0.20  --    ALKPHOS 71  --  AST 16  --    ALT 13  --      No results for input(s): "GLU" in the last 72 hours.  No results found for: "CPK", "CKMB", "TROPONINI"  Lab Results   Component Value Date/Time    IRON 32 08/06/2022 03:37 PM    FERRITIN 125.0 08/06/2022 03:37 PM        Inflammatory/Respiratory Diabetes   No results found for: "CRP"  No results found for: "ESR"  ABGs:  No results found for: "PHART", "PO2ART", "HCO3", "PCO2ART"   Lab Results   Component Value Date/Time    CREATININE 0.6 08/07/2022 08:13 AM              Vascular ankle brachial index (ABI)         CT FOOT RIGHT W CONTRAST   Final Result   Edema and skin thickening superficial to the retrocrural calcaneus consistent    with cellulitis. No gas within the soft tissues or organized drainable abscess.    Subtle areas of loss of cortex could represent early osteomyelitis of the    retrocalcaneus.          Scheduled Meds:    amLODIPine  10 mg Oral Daily    aspirin  81 mg Oral Daily    furosemide  20 mg Oral Daily    lisinopril  40 mg Oral Daily    gabapentin  100 mg Oral Nightly    sodium chloride flush  5-40 mL IntraVENous 2 times per day    piperacillin-tazobactam  3,375 mg  IntraVENous Q8H    vancomycin (VANCOCIN) IV  1,000 mg IntraVENous Q24H     Continuous Infusions:    sodium chloride         Assessment and Plan:  Principal Problem:    Heel ulcer, right, with necrosis of muscle (HCC)  Resolved Problems:    * No resolved hospital problems. *    Non-healing right heel ulceration  Cellulitis of the heel  Patient has some tenderness to palpation. Had some bloody drainage at home, but she is on Eliquis. Vitals stable, lactate normal, no leukocytosis or left shift. CT right foot with evidence for cellulitis, but not obvious for osteomyelitis. Called questionable early osteo, but with no WBC other SIRS I have a low suspicion that MRI and further osteo   -Continue on Vanc and Zosyn for now, but she does not have any risk factors for pseudomonas or MRSA. Will obtain MRSA PCR and dc vanc as appropriate.  -Currently did not think we needed to pursue MRI, as I have a lower suspicion for osteomyelitis at this point. Will order if needed per podiatry recommendations.   -Have reached out to podiatry regarding possible need for debridement. Appreciate their recommendations.  -Will hold eliquis while determining need to for bedside vs OR debridement.  -Arterial doppler  -Will ask for wound care nursing consult pending podiatry recommendations.   -Will ask for PT/OT evaluation tomorrow as well.     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 Likely contributing to her wound as above.   -Will need ongoing OP vascular follow-up. Do not think we need to consult them inpatient, but will defer to podiatry.      HTN  -Continue home lisinopril, lasix, amlodipine     Hx DVT and PE  -Hold home eliquis as above.     Nightly rest leg pain  -Arterial doppler as above.  She has intact peripheral pulses as above.  -Will check iron studies as this could be the culprit with her mildly low microcytic anemia. Especially with her chronic eliquis use she could  have some mild chronic blood loss contributing to this.  -Continue home gabapentin.     Hyperglycemia  -Check A1C     Full code. Discussed with patient on admission and she would like to be full code.  Obs, can advance to inpatient pending further management of her cellulitis.   SCDs      Shona Needles, MD  08/07/2022 2:33 PM  [facility] Hospitalist Service

## 2022-08-07 NOTE — Progress Notes (Cosign Needed)
Acute Care Occupational Therapy Evaluation   Observation  OT Visit Days: 1  Time In: 1330Time Out:1353 ( )  Acknowledge Orders   OT Charge Capture     Admitting Diagnosis:  Heel ulcer, right, with necrosis of muscle (HCC) [L97.413]  Pressure injury of skin of right heel, unspecified injury stage [L89.619]  Non-healing open wound of left heel [S91.302A]     Reason for Referral: Other abnormalities of gait and mobility (R26.89)    Payor: MEDICARE / Plan: MEDICARE PART A AND B / Product Type: *No Product type* /   SUBJECTIVE   Allison [name] is a 89 y.o. female admitted with Heel ulcer, right, with necrosis of muscle (HCC).  She  has a past medical history of DVT (deep venous thrombosis) (HCC), Hypertension, and Pulmonary embolism (HCC).  She also  has no past surgical history on file.    Subjective:  "I need to finish my peanut butter and jelly sandwich first."      Additional Pertinent History:        Social Environment  Prior Level of Function ADL  Prior Level of Function IADL    Lives With: Family  Type of Home: House  Home Layout: One level  Home Equipment: Gilmer Mor, Environmental consultant, rolling  Ambulation Assistance: Needs assistance  Transfer Assistance: Needs assistance  Receives Help From: Family, Home health  ADL Assistance: Needs assistance  Toileting: Needs assistance  Homemaking Assistance: Needs assistance  Homemaking Responsibilities: No  Active Driver: No  Occupation: Unemployed    History of Falls:No    Comments:        OBJECTIVE     Vital Signs / Pain Lines & Drains /Precautions   No vitals taken this session     Pain  Pain Screening  Pain at present: 1  Scale Used: Numeric Score  Intervention List: Patient able to continue with treatment;Nurse/Physician notified  Comments / Details: R ankle   Peripheral IV 08/06/22 Right Antecubital (Active)       External Urinary Catheter (Active)       Precautions/Restrictions  Restrictions/Precautions  Restrictions/Precautions: Fall Risk (Simultaneous filing. User may  not have seen previous data.)  Required Braces or Orthoses?: No     Orientation/Cognition Vision/Hearing   Orientation  Overall Orientation Status: Impaired  Orientation Level: Oriented to person;Oriented to place;Oriented to situation  Cognition  Overall Cognitive Status: Exceptions  Arousal/Alertness: Appropriate responses to stimuli  Following Commands: Follows one step commands consistently  Insights: Fully aware of deficits  Initiation: Requires cues for some Vision  Vision: Within Functional Limits  Hearing  Hearing: Within functional limits      Objective Assessment  Gross Assessment    Gross Assessment  AROM: Generally decreased, functional  Strength: Generally decreased, functional  Tone: Normal  Sensation: Intact        Activities of Daily Living     Feeding: Minimal assistance  Grooming: Minimal assistance  UE Bathing: Minimal assistance  LE Bathing: Minimal assistance  UE Dressing: Minimal assistance  LE Dressing: Minimal assistance  Toileting:  (Pt incontinent of bladder)  Functional Mobility: Minimal assistance    Equipment Provided:      Comments:         Functional Activity  EVALUATION ONLY   Therapeutic Activity Treatment ( 13 Minutes) CPT 97530: Therapeutic activity included Rolling, Supine to Sit, Sit to Supine, Scooting, Transfer Training, Sitting Balance , and Standing Balance to improve functional Activity tolerance, Balance, Mobility, Strength, Increase independence, and Decrease level of assistance required.  Safety: Type of Devices: Bed alarm in place;Left in bed;Call light within reach;Nurse notified     Education: Education Given To: Patient  Education Provided: Role of Therapy;Plan of Care  Education Method: Verbal  Barriers to Learning: Cognition  Education Outcome: Verbalized understanding;Continued education needed    [employer] AM-PACT "6 Clicks" Basic ADL Inpatient Short Form   How much help is needed for putting on and taking off regular lower body clothing?: A Little  How  much help is needed for bathing (which includes washing, rinsing, drying)?: A Little  How much help is needed for toileting (which includes using toilet, bedpan, or urinal)?: A Little  How much help is needed for putting on and taking off regular upper body clothing?: A Little  How much help is needed for taking care of personal grooming?: A Little  How much help for eating meals?: A Little  AM-PAC Inpatient Daily Activity Raw Score: 18  AM-PAC Inpatient ADL T-Scale Score : 38.66  ADL Inpatient CMS 0-100% Score: 46.65  ADL Inpatient CMS G-Code Modifier : CK     ASSESSMENT   Assessment   Pt limited by debility and R heel/sacral wounds.  Pt has 24/7 care at home and would be appropriate to return to that when medically ready.  Pt at times confused, but cooperative and pleasant during tx.      Evaluation Complexity:  Medium Complexity      POST ACUTE RECOMMENDATIONS   Setting: Home Health Therapy    Justification for Recommended Setting: Recommended to help patient obtain maximum functional independence.     Equipment: Equipment Needed:  (TBD)      PLAN   Frequency and Duration  (1-3 times/week) for duration of hospital stay or until stated goals are met, whichever comes first.    Ms. Klemmer presents with Fair therapy prognosis. Skilled intervention is medically necessary to address the following performance deficits: Decreased functional mobility , Decreased ADL status.  Benefits and precautions of occupational therapy have been discussed with Ms. Sharol Harness, and the following interventions are recommended: Strengthening, Balance training, Functional mobility training, Cognitive reorientation, Pain management, Safety education & training, Patient/Caregiver education & training, Equipment evaluation, education, & procurement, Self-Care / ADL.    Goals    Short Term Goals Long Term Goals     Time Frame for Long Term Goals : 30 Days  Long Term Goal 1: Maximize Indep with UE/LE ADLs.  Long Term Goal 2: Maximize Indep with  toilet t/f  Long Term Goal 3: Maximize B UE strength/endurance      Therapist Signature: Baruch Goldmann, OT    Date: 08/07/2022

## 2022-08-08 LAB — BASIC METABOLIC PANEL W/ REFLEX TO MG FOR LOW K
Anion Gap: 10 mmol/L (ref 2–17)
BUN: 9 mg/dL (ref 8–23)
CO2: 26 mmol/L (ref 22–29)
Calcium: 8.4 mg/dL — ABNORMAL LOW (ref 8.5–10.7)
Chloride: 105 mmol/L (ref 98–107)
Creatinine: 0.7 mg/dL (ref 0.5–1.0)
Est, Glom Filt Rate: 81 mL/min/1.73m (ref >=60)
Glucose: 87 mg/dL (ref 70–99)
OSMOLALITY CALCULATED: 279 mOsm/kg (ref 270–287)
Potassium: 3.6 mmol/L (ref 3.5–5.3)
Sodium: 141 mmol/L (ref 135–145)

## 2022-08-08 LAB — CBC WITH AUTO DIFFERENTIAL
Absolute Baso #: 0 10*3/uL (ref 0.0–0.2)
Absolute Eos #: 0.4 10*3/uL (ref 0.0–0.5)
Absolute Lymph #: 2.3 10*3/uL (ref 1.0–3.2)
Absolute Mono #: 0.6 10*3/uL (ref 0.3–1.0)
Basophils %: 0.6 % (ref 0.0–2.0)
Eosinophils %: 6.6 % (ref 0.0–7.0)
Hematocrit: 28.6 % — ABNORMAL LOW (ref 34.0–47.0)
Hemoglobin: 9.9 g/dL — ABNORMAL LOW (ref 11.5–15.7)
Immature Grans (Abs): 0.01 10*3/uL (ref 0.00–0.06)
Immature Granulocytes %: 0.2 % (ref 0.0–0.6)
Lymphocytes: 36 % (ref 15.0–45.0)
MCH: 28.2 pg (ref 27.0–34.5)
MCHC: 34.6 g/dL (ref 32.0–36.0)
MCV: 81.5 fL (ref 81.0–99.0)
MPV: 10.6 fL (ref 7.2–13.2)
Monocytes: 9.9 % (ref 4.0–12.0)
NRBC Absolute: 0 10*3/uL (ref 0.000–0.012)
NRBC Automated: 0 % (ref 0.0–0.2)
Neutrophils %: 46.7 % (ref 42.0–74.0)
Neutrophils Absolute: 3 10*3/uL (ref 1.6–7.3)
Platelets: 211 10*3/uL (ref 140–440)
RBC: 3.51 x10e6/mcL — ABNORMAL LOW (ref 3.60–5.20)
RDW: 13.2 % (ref 11.0–16.0)
WBC: 6.4 10*3/uL (ref 3.8–10.6)

## 2022-08-08 LAB — VAS ANKLE BRACHIAL INDEX (ABI)
Body Surface Area: 1.64 m2
Left ABI: 1.01
Left TBI: 0.6
Left arm BP: 140 mmHg
Left dorsalis pedis BP: 130 mmHg
Left posterior tibial: 141 mmHg
Left toe pressure: 84 mmHg
Right ABI: 0.88
Right TBI: 0.29
Right arm BP: 132 mmHg
Right dorsalis pedis BP: 123 mmHg
Right posterior tibial: 115 mmHg
Right toe pressure: 41 mmHg

## 2022-08-08 MED ORDER — FERROUS GLUCONATE 324 (38 FE) MG PO TABS
32438 (38 Fe) MG | Freq: Every day | ORAL | Status: DC
Start: 2022-08-08 — End: 2022-08-10
  Administered 2022-08-09 – 2022-08-10 (×2): 324 mg via ORAL

## 2022-08-08 MED FILL — PIPERACILLIN SOD-TAZOBACTAM SO 3.375 (3-0.375) G IV SOLR: 3.375 (3-0.375) g | INTRAVENOUS | Qty: 3375

## 2022-08-08 MED FILL — VANCOMYCIN HCL 1 G IV SOLR: 1 g | INTRAVENOUS | Qty: 1000

## 2022-08-08 MED FILL — ASPIRIN LOW STRENGTH 81 MG PO CHEW: 81 MG | ORAL | Qty: 1

## 2022-08-08 MED FILL — LISINOPRIL 20 MG PO TABS: 20 MG | ORAL | Qty: 2

## 2022-08-08 MED FILL — FUROSEMIDE 20 MG PO TABS: 20 MG | ORAL | Qty: 1

## 2022-08-08 MED FILL — MONOJECT FLUSH SYRINGE 0.9 % IV SOLN: 0.9 % | INTRAVENOUS | Qty: 10

## 2022-08-08 MED FILL — ACETAMINOPHEN 325 MG PO TABS: 325 MG | ORAL | Qty: 2

## 2022-08-08 MED FILL — GABAPENTIN 100 MG PO CAPS: 100 MG | ORAL | Qty: 1

## 2022-08-08 MED FILL — AMLODIPINE BESYLATE 10 MG PO TABS: 10 MG | ORAL | Qty: 1

## 2022-08-08 NOTE — Progress Notes (Signed)
[facility] Hospitalist Service     Hospitalist Progress Note     PCP: Arneta Cliche, NP-C   Admission Date: 08/06/2022         Subjective     Hemodynamically stable, afebrile. RA.   Noted anemia.  MRSA nasal swab positive. Blood cultures NGTD.    Patient is stable today. She states that she had trouble sleeping overnight from her leg discomfort. She continues to have right heel pain, which to me seems a little more painful today as compared to yesterday. More of an odor. She is eating her breakfast late at the time of my exam. Denies fevers, chills, shortness of breath, nausea, abdominal pain.        Physical Exam   Vitals: BP 123/67   Pulse 99   Temp 98.1 F (36.7 C) (Oral)   Resp 16   Ht 1.676 m (5\' 6" )   Wt 59.2 kg (130 lb 8 oz)   SpO2 100%   BMI 21.06 kg/m Temp (24hrs), Avg:98 F (36.7 C), Min:97.4 F (36.3 C), Max:98.2 F (36.8 C)    General - awake and responsive, cooperative with examination. Thin appearing.  Cardio - RRR, without MRG  Pulm - CTA bilaterally, no wheezing, normal respiratory effort   GI - soft, ND NT with normoactive BS, absent guarding or rebound tenderness  Vascular - good peripheral pulses bilateral UE / LE, no edema.   MS - symmetrical muscle strength bilateral upper and lower extremities  Skin - no rashes. Patient with eschar over right heel with loss of soft tissue. Boggy tissue around ulcer wall, more painful today on palpation. Has increased odor as well. No purulence on bandage.   Neuro - no focal weakness, no slurred speech, no facial droop  Psychiatric - appropriate mood and affect       Labs and Imaging   Data: I have personally reviewed all lab results and independently reviewed imaging studies performed in the past 24 hours.    Hematologic/Coags Chemistries   Recent Labs     08/06/22  1537 08/07/22  0813 08/08/22  0518   WBC 6.8 6.3 6.4   HGB 11.4* 10.0* 9.9*   HCT 32.6* 28.3* 28.6*   PLT 248 218 211       Lab Results   Component Value Date/Time    PROT 7.2  08/06/2022 03:37 PM    ALBUMIN 3.8 08/06/2022 03:37 PM     No components found for: "HGBA1C"  Lab Results   Component Value Date/Time    INR 1.1 10/09/2020 04:23 PM    PROTIME 14.3 10/09/2020 04:23 PM     Lab Results   Component Value Date/Time    APTT 164.9 10/10/2020 03:39 AM     No results found for: "DDIMER"   Recent Labs     08/06/22  1537 08/07/22  0813 08/08/22  0518   NA 141 140 141   K 4.4 3.7 3.6   CL 103 106 105   CO2 28 25 26    BUN 16 11 9    CREATININE 0.8 0.6 0.7   ALBUMIN 3.8  --   --    BILITOT 0.20  --   --    ALKPHOS 71  --   --    AST 16  --   --    ALT 13  --   --        No results for input(s): "GLU" in the last 72 hours.  No results found for: "CPK", "  CKMB", "TROPONINI"  Lab Results   Component Value Date/Time    IRON 32 08/06/2022 03:37 PM    FERRITIN 125.0 08/06/2022 03:37 PM        Inflammatory/Respiratory Diabetes   No results found for: "CRP"  No results found for: "ESR"  ABGs:  No results found for: "PHART", "PO2ART", "HCO3", "PCO2ART"   Lab Results   Component Value Date/Time    CREATININE 0.7 08/08/2022 05:18 AM              Vascular ankle brachial index (ABI)   Final Result      CT FOOT RIGHT W CONTRAST   Final Result   Edema and skin thickening superficial to the retrocrural calcaneus consistent    with cellulitis. No gas within the soft tissues or organized drainable abscess.    Subtle areas of loss of cortex could represent early osteomyelitis of the    retrocalcaneus.          Scheduled Meds:    amLODIPine  10 mg Oral Daily    aspirin  81 mg Oral Daily    furosemide  20 mg Oral Daily    lisinopril  40 mg Oral Daily    gabapentin  100 mg Oral Nightly    sodium chloride flush  5-40 mL IntraVENous 2 times per day    piperacillin-tazobactam  3,375 mg IntraVENous Q8H    vancomycin (VANCOCIN) IV  1,000 mg IntraVENous Q24H     Continuous Infusions:    sodium chloride         Assessment and Plan:  Principal Problem:    Heel ulcer, right, with necrosis of muscle (HCC)  Resolved Problems:     * No resolved hospital problems. *    Non-healing right heel ulceration  Cellulitis of the heel  Patient has some tenderness to palpation. Had some bloody drainage at home, but she is on Eliquis. Vitals stable, lactate normal, no leukocytosis or left shift. CT right foot with evidence for cellulitis, but not obvious for osteomyelitis. Called questionable early osteo, but with no WBC other SIRS I have a low suspicion that MRI and further osteo. MRSA positive.   -Continue on Vanc and Zosyn for now, appreciate ID recommendations today. Suspect she will need more prolonged IV antibx until her debridement and vascular intervention occurs.   -Currently did not think we needed to pursue MRI, as I have a lower suspicion for osteomyelitis at this point. Will order if needed per podiatry recommendations. Appreciate podiatry recommendations today. Discussed with team, and asking for vascular and ID recommendations. Needs revascularization or wound and debrided tissue will not heal. Daily betadine with DSD or Mepilex to heel.   -Will hold eliquis while determining need to for bedside vs OR debridement.  -Will ask for wound care nursing consult.   -Appreciate PT/OT recommendations.     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 Likely contributing to her wound as above.   -Appreciate vascular surgery recommendations regarding timing for intervention and if any further imaging is needed. Will need revascularization in close proximity to debridement, and hopefully this can occur soon.      HTN  -Continue home lisinopril, lasix, amlodipine     Hx DVT and PE  -Hold home eliquis as above.     Nightly rest leg pain  -Arterial doppler as above. She has intact peripheral pulses as above.  -Will check iron studies as this  could be the culprit with her mildly low microcytic anemia. Especially with her chronic eliquis use she could have some mild chronic blood loss contributing to  this. Iron level is low, and will start oral iron supplementation.  -Continue home gabapentin.     Hyperglycemia, resolved     Full code. Discussed with patient on admission and she would like to be full code.  Obs, can advance to inpatient pending further management of her cellulitis.   SCDs      Shona Needles, MD  08/08/2022 3:36 PM  [facility] Hospitalist Service

## 2022-08-08 NOTE — Anesthesia Pre-Procedure Evaluation (Addendum)
Department of Anesthesiology  Preprocedure Note       Name:  Allison [name]   Age:  89 y.o.  DOB:  12-12-1928                                          MRN:  [id number]         Date:  08/08/2022      Surgeon: Moishe Spice):  Ronnell Guadal[place]ara, DPM    Procedure: Procedure(s):  RIGHT HEEL DEBRIDEMENT    Medications prior to admission:   Prior to Admission medications    Medication Sig Start Date End Date Taking? Authorizing Provider   apixaban (ELIQUIS) 2.5 MG TABS tablet Take 1 tablet by mouth 2 times daily   Yes [provider]   meclizine (ANTIVERT) 25 MG tablet Take 1 tablet by mouth 3 times daily as needed for Dizziness   Yes [provider]   diclofenac sodium (VOLTAREN) 1 % GEL Apply 2 g topically daily   Yes [provider]   gabapentin (NEURONTIN) 100 MG capsule Take 1 capsule by mouth at bedtime.   Yes [provider]   omeprazole (PRILOSEC) 20 MG delayed release capsule Take 1 capsule by mouth once as needed (GERD) 12/20/21   [provider]   hydrALAZINE (APRESOLINE) 25 MG tablet Take 1 tablet by mouth every 8 hours Hold for SBP<150  Patient taking differently: Take 1 tablet by mouth every 8 hours **Hold for SBP<150** 05/06/21   Sorrell, [employer], DO   lisinopril (PRINIVIL;ZESTRIL) 40 MG tablet Take 1 tablet by mouth daily 05/06/21   Sorrell, McKenzie Esther, DO   amLODIPine (NORVASC) 10 MG tablet Take 1 tablet by mouth daily 05/07/21   Sorrell, [employer], DO   furosemide (LASIX) 20 MG tablet Take 1 tablet by mouth daily    [provider]   aspirin 81 MG chewable tablet Take 1 tablet by mouth daily    [provider]       Current medications:    Current Facility-Administered Medications   Medication Dose Route Frequency Provider Last Rate Last Admin   . [START ON 08/09/2022] ferrous gluconate (FERGON) tablet 324 mg  324 mg Oral Daily with breakfast Shona Needles, MD       . amLODIPine (NORVASC) tablet 10 mg  10 mg Oral Daily Shona Needles, MD   10 mg at 08/08/22 1021   . aspirin chewable tablet 81 mg  81 mg Oral Daily Shona Needles, MD   81 mg at 08/08/22 1021   . furosemide (LASIX) tablet 20 mg  20 mg Oral Daily Shona Needles, MD   20 mg at 08/08/22 1021   . lisinopril (PRINIVIL;ZESTRIL) tablet 40 mg  40 mg Oral Daily Shona Needles, MD   40 mg at 08/08/22 1021   . gabapentin (NEURONTIN) capsule 100 mg  100 mg Oral Nightly Shona Needles, MD   100 mg at 08/07/22 2018   . sodium chloride flush 0.9 % injection 5-40 mL  5-40 mL IntraVENous 2 times per day Shona Needles, MD   10 mL at 08/08/22 1021   . sodium chloride flush 0.9 % injection 5-40 mL  5-40 mL IntraVENous PRN Shona Needles, MD       . 0.9 % sodium chloride infusion   IntraVENous PRN Shona Needles, MD       .  potassium chloride (KLOR-CON M) extended release tablet 40 mEq  40 mEq Oral PRN Shona Needles, MD        Or   . potassium bicarb-citric acid (EFFER-K) effervescent tablet 40 mEq  40 mEq Oral PRN Shona Needles, MD        Or   . potassium chloride 10 mEq/100 mL IVPB (Peripheral Line)  10 mEq IntraVENous PRN Shona Needles, MD       . magnesium sulfate 2000 mg in water 50 mL IVPB  2,000 mg IntraVENous PRN Shona Needles, MD       . ondansetron (ZOFRAN-ODT) disintegrating tablet 4 mg  4 mg Oral Q8H PRN Shona Needles, MD        Or   . ondansetron [facility]) injection 4 mg  4 mg IntraVENous Q6H PRN Shona Needles, MD       . polyethylene glycol (GLYCOLAX) packet 17 g  17 g Oral Daily PRN Shona Needles, MD       . acetaminophen (TYLENOL) tablet 650 mg  650 mg Oral Q6H PRN Shona Needles, MD   650 mg at 08/08/22 1710    Or   . acetaminophen (TYLENOL) suppository 650 mg  650 mg Rectal Q6H PRN Shona Needles, MD       . piperacillin-tazobactam (ZOSYN) 3,375 mg in sodium chloride 0.9 % 100 mL IVPB (mini-bag)  3,375 mg IntraVENous Q8H Shona Needles, MD 25 mL/hr at 08/08/22 1841 3,375 mg at 08/08/22 1841   . vancomycin 1000 MG IVPB in 250 mL NS  add-vantage  1,000 mg IntraVENous Q24H Shona Needles, MD   Stopped at 08/08/22 1610       Allergies:    Allergies   Allergen Reactions   . Shrimp (Diagnostic) Anaphylaxis   . Strawberry Anaphylaxis   . Chocolate    . Peach [Prunus Persica]        Problem List:    Patient Active Problem List   Diagnosis Code   . Hypertensive emergency I16.1   . Heel ulcer, right, with necrosis of muscle (HCC) L97.413       Past Medical History:        Diagnosis Date   . DVT (deep venous thrombosis) (HCC)    . Hypertension    . Pulmonary embolism (HCC) 09/2020       Past Surgical History:  History reviewed. No pertinent surgical history.    Social History:    Social History     Tobacco Use   . Smoking status: Never     Passive exposure: Never   . Smokeless tobacco: Never   Substance Use Topics   . Alcohol use: Not on file                                Counseling given: Not Answered      Vital Signs (Current):   Vitals:    08/08/22 0600 08/08/22 0814 08/08/22 1328 08/08/22 1647   BP:  (!) 157/71 123/67 (!) 150/76   Pulse:  71 99 (!) 112   Resp:  16 16 16    Temp:  98 F (36.7 C) 98.1 F (36.7 C) 98.5 F (36.9 C)   TempSrc:  Oral Oral Oral   SpO2:  98% 100% 99%   Weight: 59.2 kg (130 lb 8 oz)      Height:  BP Readings from Last 3 Encounters:   08/08/22 (!) 150/76   01/13/22 (!) 145/70   01/07/22 (!) 178/76       NPO Status:                                                                                 BMI:   Wt Readings from Last 3 Encounters:   08/08/22 59.2 kg (130 lb 8 oz)   01/13/22 58.4 kg (128 lb 12 oz)   01/07/22 58.4 kg (128 lb 11.2 oz)     Body mass index is 21.06 kg/m.    CBC:   Lab Results   Component Value Date/Time    WBC 6.4 08/08/2022 05:18 AM    RBC 3.51 08/08/2022 05:18 AM    HGB 9.9 08/08/2022 05:18 AM    HCT 28.6 08/08/2022 05:18 AM    MCV 81.5 08/08/2022 05:18 AM    RDW 13.2 08/08/2022 05:18 AM    PLT 211 08/08/2022 05:18 AM       CMP:   Lab Results   Component  Value Date/Time    NA 141 08/08/2022 05:18 AM    K 3.6 08/08/2022 05:18 AM    CL 105 08/08/2022 05:18 AM    CO2 26 08/08/2022 05:18 AM    BUN 9 08/08/2022 05:18 AM    CREATININE 0.7 08/08/2022 05:18 AM    GFRAA 51 08/31/2020 12:03 AM    AGRATIO 1.12 08/06/2022 03:37 PM    LABGLOM 81 08/08/2022 05:18 AM    GLUCOSE 87 08/08/2022 05:18 AM    PROT 7.2 08/06/2022 03:37 PM    CALCIUM 8.4 08/08/2022 05:18 AM    BILITOT 0.20 08/06/2022 03:37 PM    ALKPHOS 71 08/06/2022 03:37 PM    AST 16 08/06/2022 03:37 PM    ALT 13 08/06/2022 03:37 PM       POC Tests: No results for input(s): "POCGLU", "POCNA", "POCK", "POCCL", "POCBUN", "POCHEMO", "POCHCT" in the last 72 hours.    Coags:   Lab Results   Component Value Date/Time    PROTIME 14.3 10/09/2020 04:23 PM    INR 1.1 10/09/2020 04:23 PM    APTT 164.9 10/10/2020 03:39 AM       HCG (If Applicable): No results found for: "PREGTESTUR", "PREGSERUM", "HCG", "HCGQUANT"     ABGs: No results found for: "PHART", "PO2ART", "PCO2ART", "HCO3ART", "BEART", "O2SATART"     Type & Screen (If Applicable):  No results found for: "LABABO", "LABRH"    Drug/Infectious Status (If Applicable):  No results found for: "HIV", "HEPCAB"    COVID-19 Screening (If Applicable):   Lab Results   Component Value Date/Time    COVID19 Not Detected 01/13/2022 08:55 PM           Anesthesia Evaluation  Patient summary reviewed and Nursing notes reviewed   no history of anesthetic complications:   Airway:           Dental:          Pulmonary:   (+)  COPD: mild,  Cardiovascular:  Exercise tolerance: poor (<4 METS)  (+) hypertension:         Beta Blocker:  Not on Beta Blocker         Neuro/Psych:   (+) neuromuscular disease:            GI/Hepatic/Renal:   (+) GERD:          Endo/Other:    (+) Diabetes, blood dyscrasia: anemia and anticoagulation therapy, arthritis: OA..                  ROS comment: Newly diagnosed hyperglycemia Abdominal:             Vascular:   + PVD, aortic or  cerebral, DVT, PE.       Other Findings:       Anesthesia Plan      general and TIVA     ASA 3 - emergent       Induction: intravenous.    MIPS: Postoperative opioids intended, Prophylactic antiemetics administered and Postoperative trial extubation.  Anesthetic plan and risks discussed with patient (GEN ETT vs MAC).      Plan discussed with CRNA.                Archie Balboa, MD   08/08/2022

## 2022-08-08 NOTE — Progress Notes (Signed)
Acute Care Physical Therapy Treatment Note  Inpatient   (PTA/PT Visit Days : 2)  Time In: 1350 Time Out: 1437 (47 minutes)  Acknowledge Orders  PT Charge Capture    Admitting Diagnosis:  Heel ulcer, right, with necrosis of muscle (HCC) [L97.413]  Pressure injury of skin of right heel, unspecified injury stage [L89.619]  Non-healing open wound of left heel [S91.302A]      Treatment Diagnosis: Unsteadiness on feet (R26.81)   Payor: MEDICARE / Plan: MEDICARE PART A AND B / Product Type: *No Product type* /   SUBJECTIVE   Allison [name] is a 89 y.o. female admitted with Heel ulcer, right, with necrosis of muscle (HCC). She  has a past medical history of DVT (deep venous thrombosis) (HCC), Hypertension, and Pulmonary embolism (HCC).  She also  has no past surgical history on file.    Subjective: Pt agreeable to PT.  Pt keeping her eyes closed most of treatment due to watering eyes (states this happens sometimes/baseline)     Additional Pertinent History: Additional Pertinent Hx: Pt admitted c R heel ulcer, also sacral breakdown noted by WOCN.      Social Environment Prior Level of Function   Lives With: Family  Type of Home: House  Home Layout: One level  Home Equipment: Gilmer Mor, Environmental consultant, rolling  Receives Help From: Family, Home health  ADL Assistance: Needs assistance  Homemaking Assistance: Needs assistance  Homemaking Responsibilities: No  Ambulation Assistance: Needs assistance  Transfer Assistance: Needs assistance  Active Driver: No  Occupation: Unemployed   History of Falls: No    Comments:        OBJECTIVE     Vital Signs/Pain Lines/Drains/Precautions   Vital Signs  No vitals taken this session     Pain  no pain Peripheral IV 08/06/22 Right Antecubital (Active)       Peripheral IV 08/07/22 Left;Anterior Forearm (Active)       External Urinary Catheter (Active)       Precautions/Restrictions  Restrictions/Precautions  Restrictions/Precautions: Fall Risk (Simultaneous filing. User may not have seen previous data.)      Orientation/Cognition Vision/Hearing   Cognition  Overall Cognitive Status: Exceptions  Arousal/Alertness: Appropriate responses to stimuli  Following Commands: Follows one step commands consistently  Attention Span: Attends with cues to redirect  Insights: Decreased awareness of deficits  Initiation: Requires cues for some  Sequencing: Requires cues for some  Orientation  Overall Orientation Status: Impaired  Orientation Level: Oriented to person, Oriented to place, Oriented to situation       [employer] AM-PACT "6 Clicks" Basic Mobility Inpatient Short Form   How much help is needed turning from your back to your side while in a flat bed without using bedrails?: A Lot  How much help is needed moving from lying on your back to sitting on the side of a flat bed without using bedrails?: A Lot  How much help is needed moving to and from a bed to a chair?: A Lot  How much help is needed standing up from a chair using your arms?: A Lot  How much help is needed walking in hospital room?: A Little  How much help is needed climbing 3-5 steps with a railing?: A Lot  AM-PAC Inpatient Mobility Raw Score : 13  AM-PAC Inpatient T-Scale Score : 36.74  Mobility Inpatient CMS 0-100% Score: 64.91  Mobility Inpatient CMS G-Code Modifier : CL      TREATMENT     Therapeutic Exercise (CPT 97110)  (  5 minutes)   Exercise Treatment: Seated LAQ, hip flex x 10 bilateral   To Improve:activity tolerance, AROM, strength, and mobility     Bed Mobility   Bed Mobility Training: Yes  Supine to Sit: Minimum assistance;Moderate assistance  Sit to Supine: Assist X1;Assist X2;Minimum assistance  Scooting: Moderate assistance;Assist X1     Transfer Training   Transfer Training: Yes  Sit to Stand: Moderate assistance;Assist X1 (from EOB, posterior lean)  Stand to Sit: Minimum assistance;Contact-guard assistance;Assist X1  Toilet Transfer: Moderate assistance;Assist X2 (from [facility])     Ambulation      Gait Training: Yes  Overall Level of  Assistance: Minimum assistance;Assist X1  Distance (ft): 50 Feet  Assistive Device: Walker, rolling;Gait belt  Base of Support: Narrowed  Speed/Cadence: Slow      Therapeutic Activity (42 Minutes) CPT 97530: Therapeutic activity included Supine to Sit, Sit to Supine, Scooting, Transfer Training, and Ambulation on level ground to improve functional Activity tolerance, Balance, Mobility, and Strength.  Safety: Type of Devices: Left in bed;Bed alarm in place;Call light within reach;Nurse notified     Education: Education Provided: Materials engineer Method: Verbal  Barriers to Learning: Cognition  Education Outcome: Demonstrated understanding;Continued education needed    ASSESSMENT   Assessment:  Pt needing increased assist for transfers today.  Pt needing mod A for sup-sit and increased cues to initiate activity.  Pt needed mod A to stand and had significant posterior lean c sit-stand.  Once started ambulating, balance improved.  Pt ambulated 76ft min A c RW and increased assist to turn RW.  Upon return to room, sat on [facility] for BM and to void.  Pt needed mod A of 2 to stand from [facility] and had severe posterior lean c sit-stand.  Pt needs continued PT and mobility training c focus on sit-stand transfers.  Pt tends to keep her feet too far forward c sit-stand and difficulty flexing knees enough to get feet underneath her for improved standing balance.  Pt also needs heavy cues/assist to push from seated surface and not pull on RW to stand.  Recommend SNF vs HHPT/family assist pending progress.  Pt needed increased assist c mobility today and is high fall risk.       POST ACUTE RECOMMENDATIONS   Setting: Home Health Therapy/family assist vs SNF pending progress, pt needed increased assist c mobility today    Justification for Recommended Setting: Recommended to help patient obtain maximum functional independence.     Equipment:       Discharge Transportation Recommendations: Stretcher or requires  skilled assist for transfer     PLAN   Frequency & Duration: 6 times/week for duration of hospital stay or until stated goals are met, whichever comes first.    Interventions Planned (Treatment may consist of any combination of the following):  Current Treatment Recommendations: Strengthening; ROM; Balance training; Functional mobility training; Transfer training; ADL/Self-care training; Gait training; Home exercise program; Therapeutic activities; Safety education & training      Goals    Short Term Goals Long Term Goals   Time Frame for Short Term Goals: 4 txs from 08/07/22  Short Term Goal 1: Pt will perform sit-stand cga/min A c improved balance.  Short Term Goal 2: Pt will scoot to EOB sba.  Short Term Goal 3: Pt will ambulate 22ft cga/min A c RW. Time Frame for Long Term Goals : 8 txs from 08/07/22  Long Term Goal 1: Pt will perform sit-stand sba.  Long Term Goal 2: Pt will ambulate 49ft cga/min A c RW.      Therapist Signature: Allean Found Karnisha Lefebre    Date: 08/08/2022

## 2022-08-08 NOTE — Consults (Signed)
Infectious Diseases Consult Note  Date: 08/08/2022     Patient Name: Allison [name]  Date of Birth: [birth year]  Age: 89 y.o.  PCP: Arneta Cliche, NP-C  Hospital Day: 1     Requesting MD: Dr. Reche Dixon     Reason: Antibiotic recommendations for right heel ulcer, cellulitis and possible osteomyelitis.      History of Present Illness  The patient is a 89 year old female with a past medical history of PE, hypertension, DVT, peripheral neuropathy who was admitted by the hospital service 4/24 for right heel pain and chronic ulcer.  According to EMR for family is not available ulcer appeared 3 weeks ago.  Patient does ambulate.  Also has home health services.    Labs upon admission revealed stable CBC with no leukocytosis.  Vital signs were stable.  Renal function with creatinine clearance of 48.  Denied fevers rigors or chills, nausea vomiting or diarrhea.    On exam she has a right heel pressure ulcer with eschar.  CT right foot showed edema and skin thickening superficial to the recto crural calcaneus consistent with cellulitis.  No gas within the soft tissues or organized drainable abscess.  Subtle area of loss of cortex could represent early osteomyelitis of the retrocalcaneus    Has been seen by Dr. Sandrea Hughs.  Needs arterial intervention with revascularization.  Podiatry plans on debridement once plans are set up and in place.  No current cultures.  On empiric antibiotics with Vanco and Zosyn.  MRSA PCR positive.    MRI has been deferred.  Arterial Doppler right lower extremity with mild arterial occlusive disease.  Plans for vascular outpatient follow-up.    Infectious diseases now been consulted to further guide antimicrobial therapy      Review of Systems  Constitutional: No fevers, chills, or sweats.  No unintentional weight loss or gain.  Eye: No recent visual problems.  ENMT: No hearing changes.  No ear pain.  No nasal congestion.  No sinus pressure.  No sore throat.  No oral or dental  pain.  Respiratory: No dyspnea or cough.  Cardiovascular: No chest pain, palpitations, or syncope.  No edema.  Gastrointestinal: No nausea, vomiting, diarrhea, constipation, hematochezia, melena, or abdominal pain.  Genitourinary: No dysuria or hematuria.  Musculoskeletal: No joint pain, erythema, or swelling. + neuropathy   Integumentary: No rash.   Wound right heel  with eschar- mild odor, pain with ambulation.   Neurologic: Dementia at baseline. No focal deficits  Psychiatric: No anxiety or depression.      Past Medical History  Active Ambulatory Problems     Diagnosis Date Noted    Hypertensive emergency 04/29/2021     Resolved Ambulatory Problems     Diagnosis Date Noted    No Resolved Ambulatory Problems     Past Medical History:   Diagnosis Date    DVT (deep venous thrombosis) (HCC)     Hypertension     Pulmonary embolism (HCC) 09/2020        Past Surgical History  Reviewed  History reviewed. No pertinent surgical history.     Immunizations:  Most Recent Immunizations   Administered Date(s) Administered    PPD Test 05/01/2021       Social History    No illicit drugs, ETOH abuse or smoking.      Home Medications  Prior to Admission medications    Medication Sig Start Date End Date Taking? Authorizing Provider   apixaban (ELIQUIS) 2.5 MG TABS tablet Take 1  tablet by mouth 2 times daily   Yes [provider]   meclizine (ANTIVERT) 25 MG tablet Take 1 tablet by mouth 3 times daily as needed for Dizziness   Yes [provider]   diclofenac sodium (VOLTAREN) 1 % GEL Apply 2 g topically daily   Yes [provider]   gabapentin (NEURONTIN) 100 MG capsule Take 1 capsule by mouth at bedtime.   Yes [provider]   omeprazole (PRILOSEC) 20 MG delayed release capsule Take 1 capsule by mouth once as needed (GERD) 12/20/21   [provider]   hydrALAZINE (APRESOLINE) 25 MG tablet Take 1 tablet by mouth every 8 hours Hold for SBP<150  Patient taking differently: Take 1 tablet by  mouth every 8 hours **Hold for SBP<150** 05/06/21   Sorrell, [employer], DO   lisinopril (PRINIVIL;ZESTRIL) 40 MG tablet Take 1 tablet by mouth daily 05/06/21   Sorrell, McKenzie Esther, DO   amLODIPine (NORVASC) 10 MG tablet Take 1 tablet by mouth daily 05/07/21   Sorrell, [employer], DO   furosemide (LASIX) 20 MG tablet Take 1 tablet by mouth daily    [provider]   aspirin 81 MG chewable tablet Take 1 tablet by mouth daily    [provider]        Inpatient Medications  Scheduled   amLODIPine  10 mg Oral Daily    aspirin  81 mg Oral Daily    furosemide  20 mg Oral Daily    lisinopril  40 mg Oral Daily    gabapentin  100 mg Oral Nightly    sodium chloride flush  5-40 mL IntraVENous 2 times per day    piperacillin-tazobactam  3,375 mg IntraVENous Q8H    vancomycin (VANCOCIN) IV  1,000 mg IntraVENous Q24H   Continuous   sodium chloride         Allergies  Allergies   Allergen Reactions    Shrimp (Diagnostic) Anaphylaxis    Strawberry Anaphylaxis    Chocolate     Peach [Prunus Persica]         Physical Exam  BP 123/67   Pulse 99   Temp 98.1 F (36.7 C) (Oral)   Resp 16   Ht 1.676 m (5\' 6" )   Wt 59.2 kg (130 lb 8 oz)   SpO2 100%   BMI 21.06 kg/m    General: No acute distress elderly female   Eye: EOMI.  Normal conjunctiva.  HENT: Normocephalic.  Normal hearing.  Moist oral mucosa.   Neck: Supple.  Non-tender.  Lungs: Clear to auscultation.  Breathing non-labored.  Heart: Normal rate, regular rhythm.  No murmur or gallop.   Abdomen: Bowel sounds present x4.  Abdomen soft, non-distended, non-tender to palpation.   Musculoskeletal:  No joint effusions. No LE edema   Skin: Skin is warm, dry, boggy eschar right heel, mild odor, tender to palpation.   Neurologic: Awake.  Alert.  No focal deficits.  No tremors.  CN II-XII grossly intact.  Psychiatric: Calm.  Pleasant and cooperative.     Imaging/Diagnostics Last 7 Days  CT FOOT RIGHT W CONTRAST    Result Date: 08/06/2022  Edema and skin  thickening superficial to the retrocrural calcaneus consistent with cellulitis. No gas within the soft tissues or organized drainable abscess. Subtle areas of loss of cortex could represent early osteomyelitis of the retrocalcaneus.      Lab Results  Recent Labs     08/06/22  1537 08/07/22  0813 08/08/22  0518   WBC 6.8 6.3 6.4   RBC 4.03 3.52* 3.51*   HGB 11.4* 10.0* 9.9*   HCT 32.6* 28.3* 28.6*   MCV 80.9* 80.4* 81.5   RDW 13.4 13.4 13.2   PLT 248 218 211     Recent Labs     08/06/22  1537 08/07/22  0813 08/08/22  0518   NA 141 140 141   K 4.4 3.7 3.6   CL 103 106 105   CO2 28 25 26    BUN 16 11 9    CREATININE 0.8 0.6 0.7   GLUCOSE 121* 84 87     Estimated Creatinine Clearance: 48 mL/min (based on SCr of 0.7 mg/dL).    Recent Labs     08/06/22  1537   AST 16   ALT 13   BILITOT 0.20   ALKPHOS 71       Assessment/Plan    Right heel deep soft tissue ulcer, cellulitis and possible early osteomyelitis.    Admitted with right heel pain.  Unclear how long ulcer has been present. CT confirmed right heel cellulitis, no gas, abscess and possible osteo of retro calcaneous. Has PAD based on arterial studies. Has been evaluated by Dr Sandrea Hughs and plans for operative debridement tomorrow and revascularization as outpatient to improve wound healing. Primary pathogen unknown. MRSA PCR +.     Recommend:   Continue Vancomycin   DC Zosyn  Start Cefepime 2 G IV  q 12 hours  Start oral Flagyl 500 mg TID  FU operative findings, culture results  De escalate antibiotics accordingly   Continue wound care  Place PICC at discharge   Anticipate 2 to 6 weeks of antibiotic therapy based on findings above.     Discussed with Dr Guilford Shi and Dr Aundria Rud    Derenda Fennel, APRN - NP

## 2022-08-08 NOTE — Consults (Signed)
Consult Note            Date:08/08/2022        Patient Name:Allison [name]     Date of Birth:April 26, 1928     Age:89 y.o.    Reason for Consult:  right heel ulcer    Chief Complaint     Chief Complaint   Patient presents with    Wound Check     Pt getting seen for wound on right heel by home health nurse. Goddaughter states last night home health nurse states she noticed drainage and told her it should be checked out at ED for possible infection.            History Obtained From   patient    History of Present Illness   89 yo female with RLE heel eschar x 3 weeks.  Sghe denies claudication.  Pt takes Eliquis for pulmonary embolism/VTE.  Otherwise risk factors significant for hypertension and neuropathy. She denies claudication however admits to RLE rest pain, worse at night.  No LLE symptoms.    Past Medical History     Past Medical History:   Diagnosis Date    DVT (deep venous thrombosis) (HCC)     Hypertension     Pulmonary embolism (HCC) 09/2020        Past Surgical History   History reviewed. No pertinent surgical history.      Medications     Prior to Admission medications    Medication Sig Start Date End Date Taking? Authorizing Provider   apixaban (ELIQUIS) 2.5 MG TABS tablet Take 1 tablet by mouth 2 times daily   Yes [provider]   meclizine (ANTIVERT) 25 MG tablet Take 1 tablet by mouth 3 times daily as needed for Dizziness   Yes [provider]   diclofenac sodium (VOLTAREN) 1 % GEL Apply 2 g topically daily   Yes [provider]   gabapentin (NEURONTIN) 100 MG capsule Take 1 capsule by mouth at bedtime.   Yes [provider]   omeprazole (PRILOSEC) 20 MG delayed release capsule Take 1 capsule by mouth once as needed (GERD) 12/20/21   [provider]   hydrALAZINE (APRESOLINE) 25 MG tablet Take 1 tablet by mouth every 8 hours Hold for SBP<150  Patient taking differently: Take 1 tablet by mouth every 8 hours **Hold for SBP<150** 05/06/21   Sorrell, [employer],  DO   lisinopril (PRINIVIL;ZESTRIL) 40 MG tablet Take 1 tablet by mouth daily 05/06/21   Sorrell, McKenzie Esther, DO   amLODIPine (NORVASC) 10 MG tablet Take 1 tablet by mouth daily 05/07/21   Sorrell, [employer], DO   furosemide (LASIX) 20 MG tablet Take 1 tablet by mouth daily    [provider]   aspirin 81 MG chewable tablet Take 1 tablet by mouth daily    [provider]        Melene Muller ON 08/09/2022] ferrous gluconate (FERGON) tablet 324 mg, Daily with breakfast  amLODIPine (NORVASC) tablet 10 mg, Daily  aspirin chewable tablet 81 mg, Daily  furosemide (LASIX) tablet 20 mg, Daily  lisinopril (PRINIVIL;ZESTRIL) tablet 40 mg, Daily  gabapentin (NEURONTIN) capsule 100 mg, Nightly  sodium chloride flush 0.9 % injection 5-40 mL, 2 times per day  sodium chloride flush 0.9 % injection 5-40 mL, PRN  0.9 % sodium chloride infusion, PRN  potassium chloride (KLOR-CON M) extended release tablet 40 mEq, PRN   Or  potassium bicarb-citric acid (EFFER-K) effervescent tablet 40 mEq, PRN  Or  potassium chloride 10 mEq/100 mL IVPB (Peripheral Line), PRN  magnesium sulfate 2000 mg in water 50 mL IVPB, PRN  ondansetron (ZOFRAN-ODT) disintegrating tablet 4 mg, Q8H PRN   Or  ondansetron (ZOFRAN) injection 4 mg, Q6H PRN  polyethylene glycol (GLYCOLAX) packet 17 g, Daily PRN  acetaminophen (TYLENOL) tablet 650 mg, Q6H PRN   Or  acetaminophen (TYLENOL) suppository 650 mg, Q6H PRN  piperacillin-tazobactam (ZOSYN) 3,375 mg in sodium chloride 0.9 % 100 mL IVPB (mini-bag), Q8H  vancomycin 1000 MG IVPB in 250 mL NS add-vantage, Q24H        Allergies   Shrimp (diagnostic), Strawberry, Chocolate, and Peach [prunus persica]    Social History     Social History       Tobacco History       Smoking Status  Never      Passive Exposure  Never      Smokeless Tobacco Use  Never              Alcohol History       Alcohol Use Status  Not Asked              Drug Use       Drug Use Status  Never              Sexual Activity        Sexually Active  Never                    Family History   History reviewed. No pertinent family history.    Review of Systems   Full 10 point ROS performed and negative other than as above.     Physical Exam   BP 123/67   Pulse 99   Temp 98.1 F (36.7 C) (Oral)   Resp 16   Ht 1.676 m (5\' 6" )   Wt 59.2 kg (130 lb 8 oz)   SpO2 100%   BMI 21.06 kg/m     CONSTITUTIONAL:  awake, alert, cooperative,89 y.o. femaleno apparent distress, and appears stated age  LUNGS:  No increased work of breathing, good air exchange, clear to auscultation bilaterally, no crackles or wheezing  CARDIOVASCULAR:  Normal apical impulse, regular rate and rhythm, normal S1 and S2, no S3 or S4, and no murmur noted,   Back: No kyphosis or scoliosis  Abdomen: Soft nontender nondistended.  No large pulsatile mass.  Neck: Soft nontender nondistended.  No JVD.  Eyes: Pupils equal and reactive to light and accommodation bilaterally. No xanthelasma  MUSCULOSKELETAL:    RLE: There is no redness, warmth, or swelling of the joints.  Full range of motion noted.  Motor strength and sensation is 5 out of 5 all extremities bilaterally.  3 cm heel eschar.  No exposed bone  LLE: There is no redness, warmth, or swelling of the joints.  Full range of motion noted.  Motor strength and sensation is 5 out of 5 all extremities bilaterally.  Tone is normal.  NEUROLOGIC:  Awake, alert, oriented to name, place and time.  Cranial nerves II-XII are grossly intact.      R CFA  2+       L CFA 2+  R pop 1+         L POP 2+  R DP mono         L DP 2+  R PT mono         L PT 2+  R Plantar  mono     L Plantar bi    Labs    CBC:  Recent Labs     08/06/22  1537 08/07/22  0813 08/08/22  0518   WBC 6.8 6.3 6.4   RBC 4.03 3.52* 3.51*   HGB 11.4* 10.0* 9.9*   HCT 32.6* 28.3* 28.6*   MCV 80.9* 80.4* 81.5   RDW 13.4 13.4 13.2   PLT 248 218 211     CHEMISTRIES:  Recent Labs     08/06/22  1537 08/07/22  0813 08/08/22  0518   NA 141 140 141   K 4.4 3.7 3.6   CL 103 106 105   CO2  28 25 26    BUN 16 11 9    CREATININE 0.8 0.6 0.7   GLUCOSE 121* 84 87     PT/INR:No results for input(s): "PROTIME", "INR" in the last 72 hours.  APTT:No results for input(s): "APTT" in the last 72 hours.  LIVER PROFILE:  Recent Labs     08/06/22  1537   AST 16   ALT 13   BILITOT 0.20   ALKPHOS 71       Imaging/Diagnostics   CT FOOT RIGHT W CONTRAST    Result Date: 08/06/2022  Edema and skin thickening superficial to the retrocrural calcaneus consistent with cellulitis. No gas within the soft tissues or organized drainable abscess. Subtle areas of loss of cortex could represent early osteomyelitis of the retrocalcaneus.      ABI/TBI 08/06/2022 right 0.88/0.29.  Left 1.01/0.6    Assessment      Hospital Problems             Last Modified POA    * (Principal) Heel ulcer, right, with necrosis of muscle (HCC) 08/06/2022 Yes       Plan   @POCASSESSPLANORD @     89 yo female with nonhealing right calcaneal eschar.  No evidence of aggressive infection.  Plan for debridement tomorrow with Dr. Sandrea Hughs.  ABI is likely falsely elevated, true pressure is most likely consistent with the TBI of 0.29 (severe arterial insufficiency).  I have recommended a RLE angiogram with intention to treat culprit lesions.  This is best performed as an outpatient due to pattern of disease (tibial, plantar and microvascular).  Recommend discharge as soon as possible after debridement.  We will schedule the procedure with her granddaughter for this coming week.  Recommend high protein, low carb diet with supplements.  Wound care per podiatry.      72-minutes total time was spent preparing to see the patient, reviewing any necessary prior test results, performing a medical appropriate examination and evaluation, counseling and educating the patient and/or time spent ordering medication, tests and/or procedures, in addition to referring and/or communicating with other healthcare professionals and documenting clinical information in electronic or other  health-related records.      Electronically signed by Marveen Reeks, MD on 08/08/22 at 4:50 PM EDT

## 2022-08-09 LAB — CBC WITH AUTO DIFFERENTIAL
Absolute Baso #: 0.1 10*3/uL (ref 0.0–0.2)
Absolute Eos #: 0.4 10*3/uL (ref 0.0–0.5)
Absolute Lymph #: 2.2 10*3/uL (ref 1.0–3.2)
Absolute Mono #: 0.6 10*3/uL (ref 0.3–1.0)
Basophils %: 0.8 % (ref 0.0–2.0)
Eosinophils %: 5.5 % (ref 0.0–7.0)
Hematocrit: 27.8 % — ABNORMAL LOW (ref 34.0–47.0)
Hemoglobin: 9.8 g/dL — ABNORMAL LOW (ref 11.5–15.7)
Immature Grans (Abs): 0.01 10*3/uL (ref 0.00–0.06)
Immature Granulocytes %: 0.2 % (ref 0.0–0.6)
Lymphocytes: 33 % (ref 15.0–45.0)
MCH: 28.5 pg (ref 27.0–34.5)
MCHC: 35.3 g/dL (ref 32.0–36.0)
MCV: 80.8 fL — ABNORMAL LOW (ref 81.0–99.0)
MPV: 10 fL (ref 7.2–13.2)
Monocytes: 9.3 % (ref 4.0–12.0)
NRBC Absolute: 0 10*3/uL (ref 0.000–0.012)
NRBC Automated: 0 % (ref 0.0–0.2)
Neutrophils %: 51.2 % (ref 42.0–74.0)
Neutrophils Absolute: 3.4 10*3/uL (ref 1.6–7.3)
Platelets: 208 10*3/uL (ref 140–440)
RBC: 3.44 x10e6/mcL — ABNORMAL LOW (ref 3.60–5.20)
RDW: 13.2 % (ref 11.0–16.0)
WBC: 6.6 10*3/uL (ref 3.8–10.6)

## 2022-08-09 LAB — BASIC METABOLIC PANEL W/ REFLEX TO MG FOR LOW K
Anion Gap: 10 mmol/L (ref 2–17)
BUN: 11 mg/dL (ref 8–23)
CO2: 25 mmol/L (ref 22–29)
Calcium: 8.4 mg/dL — ABNORMAL LOW (ref 8.5–10.7)
Chloride: 106 mmol/L (ref 98–107)
Creatinine: 0.7 mg/dL (ref 0.5–1.0)
Est, Glom Filt Rate: 81 mL/min/1.73m (ref >=60)
Glucose: 89 mg/dL (ref 70–99)
OSMOLALITY CALCULATED: 280 mOsm/kg (ref 270–287)
Potassium: 3.7 mmol/L (ref 3.5–5.3)
Sodium: 141 mmol/L (ref 135–145)

## 2022-08-09 LAB — POCT GLUCOSE: POC Glucose: 89 mg/dL (ref 65.0–110.0)

## 2022-08-09 LAB — ADD ON LAB TEST

## 2022-08-09 MED ORDER — FENTANYL CITRATE (PF) 100 MCG/2ML IJ SOLN
100 MCG/2ML | INTRAMUSCULAR | Status: DC | PRN
  Administered 2022-08-09 (×2): 25 via INTRAVENOUS

## 2022-08-09 MED ORDER — ONDANSETRON HCL 4 MG/2ML IJ SOLN
4 | Freq: Once | INTRAMUSCULAR | Status: DC | PRN
Start: 2022-08-09 — End: 2022-08-09

## 2022-08-09 MED ORDER — NORMAL SALINE FLUSH 0.9 % IV SOLN
0.9 | INTRAVENOUS | Status: DC | PRN
Start: 2022-08-09 — End: 2022-08-09

## 2022-08-09 MED ORDER — LIDOCAINE HCL (PF) 2 % IJ SOLN
2 | INTRAMUSCULAR | Status: AC
Start: 2022-08-09 — End: ?

## 2022-08-09 MED ORDER — LACTATED RINGERS IV SOLN
INTRAVENOUS | Status: DC | PRN
  Administered 2022-08-09: 17:00:00 via INTRAVENOUS

## 2022-08-09 MED ORDER — CETIRIZINE HCL 10 MG PO TABS
10 MG | Freq: Every day | ORAL | Status: DC
Start: 2022-08-09 — End: 2022-08-10
  Administered 2022-08-09 – 2022-08-10 (×2): 5 mg via ORAL

## 2022-08-09 MED ORDER — FENTANYL CITRATE (PF) 100 MCG/2ML IJ SOLN
100 | INTRAMUSCULAR | Status: DC | PRN
Start: 2022-08-09 — End: 2022-08-09

## 2022-08-09 MED ORDER — CEFEPIME HCL 2 G IV SOLR
2 g | Freq: Two times a day (BID) | INTRAVENOUS | Status: DC
Start: 2022-08-09 — End: 2022-08-10
  Administered 2022-08-09 – 2022-08-10 (×4): 2000 mg via INTRAVENOUS

## 2022-08-09 MED ORDER — HYDRALAZINE HCL 20 MG/ML IJ SOLN
20 | INTRAMUSCULAR | Status: DC | PRN
Start: 2022-08-09 — End: 2022-08-09

## 2022-08-09 MED ORDER — ONDANSETRON HCL 4 MG/2ML IJ SOLN
4 MG/2ML | INTRAMUSCULAR | Status: DC | PRN
  Administered 2022-08-09: 18:00:00 4 via INTRAVENOUS

## 2022-08-09 MED ORDER — METRONIDAZOLE 250 MG PO TABS
250 MG | Freq: Three times a day (TID) | ORAL | Status: DC
Start: 2022-08-09 — End: 2022-08-10
  Administered 2022-08-09 – 2022-08-10 (×5): 500 mg via ORAL

## 2022-08-09 MED ORDER — ONDANSETRON HCL 4 MG/2ML IJ SOLN
4 | INTRAMUSCULAR | Status: AC
Start: 2022-08-09 — End: ?

## 2022-08-09 MED ORDER — DEXAMETHASONE SODIUM PHOSPHATE 4 MG/ML IJ SOLN
4 MG/ML | INTRAMUSCULAR | Status: DC | PRN
  Administered 2022-08-09: 18:00:00 4 via INTRAVENOUS

## 2022-08-09 MED ORDER — PHENYLEPHRINE HCL 1 MG/10ML IV SOSY
1 MG/10ML | INTRAVENOUS | Status: DC | PRN
  Administered 2022-08-09: 18:00:00 100 via INTRAVENOUS

## 2022-08-09 MED ORDER — IPRATROPIUM-ALBUTEROL 0.5-2.5 (3) MG/3ML IN SOLN
Freq: Once | RESPIRATORY_TRACT | Status: DC | PRN
Start: 2022-08-09 — End: 2022-08-09

## 2022-08-09 MED ORDER — LIDOCAINE HCL (PF) 2 % IJ SOLN
2 % | INTRAMUSCULAR | Status: DC | PRN
  Administered 2022-08-09: 17:00:00 60 via INTRAVENOUS

## 2022-08-09 MED ORDER — PROPOFOL 200 MG/20ML IV EMUL
200 | INTRAVENOUS | Status: AC
Start: 2022-08-09 — End: ?

## 2022-08-09 MED ORDER — DEXAMETHASONE SODIUM PHOSPHATE 4 MG/ML IJ SOLN
4 | INTRAMUSCULAR | Status: AC
Start: 2022-08-09 — End: ?

## 2022-08-09 MED ORDER — EPHEDRINE SULFATE (PRESSORS) 50 MG/ML IV SOLN
50 MG/ML | INTRAVENOUS | Status: DC | PRN
  Administered 2022-08-09 (×2): 10 via INTRAVENOUS

## 2022-08-09 MED ORDER — SODIUM CHLORIDE 0.9 % IV SOLN
0.9 | INTRAVENOUS | Status: DC | PRN
Start: 2022-08-09 — End: 2022-08-09

## 2022-08-09 MED ORDER — EPHEDRINE SULFATE (PRESSORS) 50 MG/ML IV SOLN
50 | INTRAVENOUS | Status: AC
Start: 2022-08-09 — End: ?

## 2022-08-09 MED ORDER — LABETALOL HCL 5 MG/ML IV SOLN
5 | INTRAVENOUS | Status: DC | PRN
Start: 2022-08-09 — End: 2022-08-09

## 2022-08-09 MED ORDER — GLYCOPYRROLATE PF 0.4 MG/2ML IJ SOSY
0.4 | INTRAMUSCULAR | Status: AC
Start: 2022-08-09 — End: ?

## 2022-08-09 MED ORDER — SODIUM CHLORIDE (PF) 0.9 % IJ SOLN
0.9 % | INTRAMUSCULAR | Status: DC | PRN
Start: 2022-08-09 — End: 2022-08-09

## 2022-08-09 MED ORDER — BUPIVACAINE HCL (PF) 0.5 % IJ SOLN
0.5 | INTRAMUSCULAR | Status: DC | PRN
Start: 2022-08-09 — End: 2022-08-09
  Administered 2022-08-09: 18:00:00 10 via INTRADERMAL

## 2022-08-09 MED ORDER — HALOPERIDOL LACTATE 5 MG/ML IJ SOLN
5 | Freq: Once | INTRAMUSCULAR | Status: DC | PRN
Start: 2022-08-09 — End: 2022-08-09

## 2022-08-09 MED ORDER — FENTANYL CITRATE (PF) 100 MCG/2ML IJ SOLN
100 | INTRAMUSCULAR | Status: AC
Start: 2022-08-09 — End: ?

## 2022-08-09 MED ORDER — PHENYLEPHRINE HCL 1 MG/10ML IV SOSY
1 | INTRAVENOUS | Status: AC
Start: 2022-08-09 — End: ?

## 2022-08-09 MED ORDER — BUPIVACAINE HCL (PF) 0.5 % IJ SOLN
0.5 | INTRAMUSCULAR | Status: AC
Start: 2022-08-09 — End: 2022-08-09

## 2022-08-09 MED ORDER — LIDOCAINE HCL 1 % IJ SOLN
1 | INTRAMUSCULAR | Status: AC
Start: 2022-08-09 — End: 2022-08-09

## 2022-08-09 MED ORDER — GLYCOPYRROLATE PF 0.4 MG/2ML IJ SOSY
0.4 MG/2ML | INTRAMUSCULAR | Status: DC | PRN
  Administered 2022-08-09: 18:00:00 .2 via INTRAVENOUS

## 2022-08-09 MED ORDER — HYDROMORPHONE HCL 1 MG/ML IJ SOLN
1 | INTRAMUSCULAR | Status: DC | PRN
Start: 2022-08-09 — End: 2022-08-09

## 2022-08-09 MED ORDER — PROPOFOL 200 MG/20ML IV EMUL
200 MG/20ML | INTRAVENOUS | Status: DC | PRN
  Administered 2022-08-09: 17:00:00 70 via INTRAVENOUS

## 2022-08-09 MED ORDER — NORMAL SALINE FLUSH 0.9 % IV SOLN
0.9 | Freq: Two times a day (BID) | INTRAVENOUS | Status: DC
Start: 2022-08-09 — End: 2022-08-09

## 2022-08-09 MED FILL — SENSORCAINE-MPF 0.5 % IJ SOLN: 0.5 % | INTRAMUSCULAR | Qty: 30

## 2022-08-09 MED FILL — CEFEPIME HCL 2 G IV SOLR: 2 g | INTRAVENOUS | Qty: 2

## 2022-08-09 MED FILL — VANCOMYCIN HCL 1 G IV SOLR: 1 g | INTRAVENOUS | Qty: 1000

## 2022-08-09 MED FILL — METRONIDAZOLE 250 MG PO TABS: 250 MG | ORAL | Qty: 2

## 2022-08-09 MED FILL — XYLOCAINE 1 % IJ SOLN: 1 % | INTRAMUSCULAR | Qty: 20

## 2022-08-09 MED FILL — PHENYLEPHRINE HCL (PRESSORS) 1 MG/10ML IV SOSY: 1 MG/0ML | INTRAVENOUS | Qty: 10

## 2022-08-09 MED FILL — DEXAMETHASONE SODIUM PHOSPHATE 4 MG/ML IJ SOLN: 4 MG/ML | INTRAMUSCULAR | Qty: 1

## 2022-08-09 MED FILL — FENTANYL CITRATE (PF) 100 MCG/2ML IJ SOLN: 100 MCG/2ML | INTRAMUSCULAR | Qty: 2

## 2022-08-09 MED FILL — LISINOPRIL 20 MG PO TABS: 20 MG | ORAL | Qty: 2

## 2022-08-09 MED FILL — GLYCOPYRROLATE PF 0.4 MG/2ML IJ SOSY: 0.4 MG/2ML | INTRAMUSCULAR | Qty: 2

## 2022-08-09 MED FILL — FUROSEMIDE 20 MG PO TABS: 20 MG | ORAL | Qty: 1

## 2022-08-09 MED FILL — EPHEDRINE SULFATE (PRESSORS) 50 MG/ML IV SOLN: 50 MG/ML | INTRAVENOUS | Qty: 1

## 2022-08-09 MED FILL — GABAPENTIN 100 MG PO CAPS: 100 MG | ORAL | Qty: 1

## 2022-08-09 MED FILL — MONOJECT FLUSH SYRINGE 0.9 % IV SOLN: 0.9 % | INTRAVENOUS | Qty: 10

## 2022-08-09 MED FILL — FERROUS GLUCONATE 324 (38 FE) MG PO TABS: 324 (38 Fe) MG | ORAL | Qty: 1

## 2022-08-09 MED FILL — DIPRIVAN 200 MG/20ML IV EMUL: 200 MG/20ML | INTRAVENOUS | Qty: 20

## 2022-08-09 MED FILL — ASPIRIN LOW STRENGTH 81 MG PO CHEW: 81 MG | ORAL | Qty: 1

## 2022-08-09 MED FILL — AMLODIPINE BESYLATE 10 MG PO TABS: 10 MG | ORAL | Qty: 1

## 2022-08-09 MED FILL — XYLOCAINE-MPF 2 % IJ SOLN: 2 % | INTRAMUSCULAR | Qty: 5

## 2022-08-09 MED FILL — CETIRIZINE HCL 10 MG PO TABS: 10 MG | ORAL | Qty: 1

## 2022-08-09 MED FILL — ONDANSETRON HCL 4 MG/2ML IJ SOLN: 4 MG/2ML | INTRAMUSCULAR | Qty: 2

## 2022-08-09 MED FILL — ACETAMINOPHEN 325 MG PO TABS: 325 MG | ORAL | Qty: 2

## 2022-08-09 NOTE — Plan of Care (Signed)
Problem: Discharge Planning  Goal: Discharge to home or other facility with appropriate resources  08/09/2022 1200 by Adolph Clutter, Marylee Floras, RN  Outcome: Progressing  Flowsheets (Taken 08/06/2022 2020 by Cari Caraway, RN)  Discharge to home or other facility with appropriate resources: Identify barriers to discharge with patient and caregiver  08/08/2022 2242 by Zachery Dauer, RN  Outcome: Progressing     Problem: Safety - Adult  Goal: Free from fall injury  08/09/2022 1200 by Elea Holtzclaw, Marylee Floras, RN  Outcome: Progressing  Flowsheets (Taken 08/09/2022 1200)  Free From Fall Injury:   Instruct family/caregiver on patient safety   Based on caregiver fall risk screen, instruct family/caregiver to ask for assistance with transferring infant if caregiver noted to have fall risk factors  08/08/2022 2242 by Zachery Dauer, RN  Outcome: Progressing     Problem: Skin/Tissue Integrity  Goal: Absence of new skin breakdown  Description: 1.  Monitor for areas of redness and/or skin breakdown  2.  Assess vascular access sites hourly  3.  Every 4-6 hours minimum:  Change oxygen saturation probe site  4.  Every 4-6 hours:  If on nasal continuous positive airway pressure, respiratory therapy assess nares and determine need for appliance change or resting period.  08/08/2022 2242 by Zachery Dauer, RN  Outcome: Not Progressing

## 2022-08-09 NOTE — Plan of Care (Signed)
Problem: Discharge Planning  Goal: Discharge to home or other facility with appropriate resources  08/09/2022 2156 by Thompson Caul, RN  Outcome: Progressing  Flowsheets (Taken 08/09/2022 2000)  Discharge to home or other facility with appropriate resources:   Identify barriers to discharge with patient and caregiver   Arrange for interpreters to assist at discharge as needed   Refer to discharge planning if patient needs post-hospital services based on physician order or complex needs related to functional status, cognitive ability or social support system   Identify discharge learning needs (meds, wound care, etc)   Arrange for needed discharge resources and transportation as appropriate  08/09/2022 1200 by Agujitas, Marylee Floras, RN  Outcome: Progressing  Flowsheets (Taken 08/06/2022 2020 by Cari Caraway, RN)  Discharge to home or other facility with appropriate resources: Identify barriers to discharge with patient and caregiver     Problem: Skin/Tissue Integrity  Goal: Absence of new skin breakdown  Description: 1.  Monitor for areas of redness and/or skin breakdown  2.  Assess vascular access sites hourly  3.  Every 4-6 hours minimum:  Change oxygen saturation probe site  4.  Every 4-6 hours:  If on nasal continuous positive airway pressure, respiratory therapy assess nares and determine need for appliance change or resting period.  Outcome: Progressing     Problem: ABCDS Injury Assessment  Goal: Absence of physical injury  Outcome: Progressing  Flowsheets (Taken 08/09/2022 2000)  Absence of Physical Injury: Implement safety measures based on patient assessment     Problem: Safety - Adult  Goal: Free from fall injury  08/09/2022 2156 by Thompson Caul, RN  Outcome: Progressing  Flowsheets (Taken 08/09/2022 2000)  Free From Fall Injury:   Instruct family/caregiver on patient safety   Based on caregiver fall risk screen, instruct family/caregiver to ask for assistance with transferring  infant if caregiver noted to have fall risk factors  08/09/2022 1200 by Agujitas, Marylee Floras, RN  Outcome: Progressing  Flowsheets (Taken 08/09/2022 1200)  Free From Fall Injury:   Instruct family/caregiver on patient safety   Based on caregiver fall risk screen, instruct family/caregiver to ask for assistance with transferring infant if caregiver noted to have fall risk factors     Problem: Pain  Goal: Verbalizes/displays adequate comfort level or baseline comfort level  Outcome: Progressing  Flowsheets (Taken 08/09/2022 2017)  Verbalizes/displays adequate comfort level or baseline comfort level:   Encourage patient to monitor pain and request assistance   Assess pain using appropriate pain scale   Administer analgesics based on type and severity of pain and evaluate response   Implement non-pharmacological measures as appropriate and evaluate response   Consider cultural and social influences on pain and pain management   Notify Licensed Independent Practitioner if interventions unsuccessful or patient reports new pain     Problem: Neurosensory - Adult  Goal: Achieves stable or improved neurological status  Outcome: Progressing  Flowsheets (Taken 08/09/2022 2000)  Achieves stable or improved neurological status:   Assess for and report changes in neurological status   Initiate measures to prevent increased intracranial pressure   Maintain blood pressure and fluid volume within ordered parameters to optimize cerebral perfusion and minimize risk of hemorrhage   Monitor temperature, glucose, and sodium. Initiate appropriate interventions as ordered  Goal: Achieves maximal functionality and self care  Outcome: Progressing  Flowsheets (Taken 08/09/2022 2000)  Achieves maximal functionality and self care:   Monitor swallowing and airway patency with patient  fatigue and changes in neurological status   Encourage and assist patient to increase activity and self care with guidance from physical therapy/occupational  therapy   Encourage visually impaired, hearing impaired and aphasic patients to use assistive/communication devices     Problem: Skin/Tissue Integrity - Adult  Goal: Incisions, wounds, or drain sites healing without S/S of infection  Outcome: Progressing  Flowsheets (Taken 08/09/2022 2000)  Incisions, Wounds, or Drain Sites Healing Without Sign and Symptoms of Infection:   ADMISSION and DAILY: Assess and document risk factors for pressure ulcer development   TWICE DAILY: Assess and document skin integrity   TWICE DAILY: Assess and document dressing/incision, wound bed, drain sites and surrounding tissue   Implement wound care per orders   Initiate isolation precautions as appropriate   Initiate pressure ulcer prevention bundle as indicated     Problem: Musculoskeletal - Adult  Goal: Return mobility to safest level of function  Outcome: Progressing  Flowsheets (Taken 08/09/2022 2000)  Return Mobility to Safest Level of Function:   Assess patient stability and activity tolerance for standing, transferring and ambulating with or without assistive devices   Assist with transfers and ambulation using safe patient handling equipment as needed   Ensure adequate protection for wounds/incisions during mobilization   Obtain physical therapy/occupational therapy consults as needed   Instruct patient/family in ordered activity level  Goal: Return ADL status to a safe level of function  Outcome: Progressing  Flowsheets (Taken 08/09/2022 2000)  Return ADL Status to a Safe Level of Function:   Administer medication as ordered   Assess activities of daily living deficits and provide assistive devices as needed   Obtain physical therapy/occupational therapy consults as needed   Assist and instruct patient to increase activity and self care as tolerated     Problem: Genitourinary - Adult  Goal: Absence of urinary retention  Outcome: Progressing  Flowsheets (Taken 08/09/2022 2000)  Absence of urinary retention:   Assess patient's ability  to void and empty bladder   Discuss catheterization for long term situations as appropriate   Discuss with Licensed Independent Practitioner  medications to alleviate retention as needed   Place urinary catheter per Licensed Independent Practitioner order if needed   Monitor intake/output and perform bladder scan as needed

## 2022-08-09 NOTE — Op Note (Signed)
Operative Note      Patient: Allison [name]  Date of Birth: 05-Jun-1928  MRN: [id number]    Date of Procedure: 08/09/2022  Service: Podiatric Surgery  Surgeon: Bernette Redbird, DPM, AACFAS  Assistant: Surgical scrub  Pre-procedure diagnosis:  1. Heel ulceration with necrosis, unstageable, right foot.   Post-procedure diagnosis:  Heel ulceration with necrosis, unstageable, right foot.   Procedure:  1. Excisional debridement of all infected and nonviable tissue, right foot.  Anesthesia: General  Hemostasis: None  Injectables: 10mL 0.25%Marcaine Plain  EBL: Minimal  Condition: Stable     Description of Procedure in Detail:  Patient presents to pre-operative holding [facility] due to heel ulceration with necrosis, unstageable, right foot. All preoperative history and physical and labs were reviewed and no contraindication to the proposed procedure was found. Preoperative consent was obtained, signed, witnessed and placed in the patients chart. Patient confirmed NPO status proper for administration of anesthesia. The operative extremity was marked in indelible ink. All questions regarding surgery were answered and no guarantees to surgical outcome were expressed or implied.      Patient was transferred to the operating room and placed on the operating table in the lateral decubitus position. Anesthesia was administered per anesthesia staff and a local proximal anesthetic block was performed. The right foot and ankle was prepped and draped in the usual aseptic manner. Proper operative timeout was performed.     Attention was directed to the right heel at the level of ulceration. Boggy, necrotic pressure ulcer was noted and using a #15-blade eschar was incised and de-roofed sharply. Incision was carried into the subcutaneous tissue with bleeding noted. Microthrombi were encountered within the wound. No liquefactive tissue was present. Tissue appeared purple and red with areas of granulation. Wound did not probe or track  to bone. No bone exposure present. Wound measured 4.0cm x 2.5cm x 0.4cm Site was flushed using normal sterile saline and tissue specimen placed on back table. Site was dressed using xeroform, DSD and ACE bandage for light compression.     Patient tolerated the procedure without issue and was transferred from the operating room to recovery with vital signs stable.    Electronically signed by Ronnell Guadalajara, DPM on 08/09/2022 at 6:48 PM

## 2022-08-09 NOTE — Anesthesia Post-Procedure Evaluation (Signed)
Department of Anesthesiology  Postprocedure Note    Patient: ALAYAH KNOUFF  MRN: [id number]  Birthdate: 05-04-1928  Date of evaluation: 08/09/2022    Procedure Summary       Date: 08/09/22 Room / Location: [facility] OR 01 / [facility] MAIN OR    Anesthesia Start: 1317 Anesthesia Stop: 1357    Procedure: RIGHT HEEL DEBRIDEMENT (Right: Foot) Diagnosis: Abscess    Surgeons: Ronnell Guadalajara, DPM Responsible Provider: De Burrs, MD    Anesthesia Type: General ASA Status: 3 - Emergent            Anesthesia Type: General    Aldrete Phase I: Aldrete Score: 9    Aldrete Phase II:      Anesthesia Post Evaluation    Patient location during evaluation: PACU  Patient participation: complete - patient participated  Level of consciousness: awake  Pain score: 2  Airway patency: patent  Nausea & Vomiting: no vomiting and no nausea  Cardiovascular status: hemodynamically stable  Respiratory status: acceptable  Hydration status: stable  Comments: Vss  see pacu notes  Pain management: adequate    No notable events documented.

## 2022-08-09 NOTE — Progress Notes (Signed)
Infectious Diseases Progress Note  Date: 08/09/2022  Patient Name: Allison [name]  Date of Birth: June 06, 1928  Age: 89 y.o.  Hospital Day: 2       History of Present Illness    Seen this a.m.  Family member at bedside.  Answered questions.  Plans for OR debridement of right heel ulcer today.  Cultures and labs updated.  Chief complaint: Heel pain    Review of Systems  Constitutional: No fevers, chills, or sweats.  Eye: No recent visual problems.  ENMT: No ear pain.  No nasal congestion.  No sore throat.  Respiratory: No shortness of breath or cough.  Cardiovascular: No chest pain, palpitations, or syncope.  Gastrointestinal: No nausea, vomiting, diarrhea, constipation, or abdominal pain.  Genitourinary: No dysuria or hematuria.       Inpatient Medications  Scheduled   lidocaine        [MAR Hold] ferrous gluconate  324 mg Oral Daily with breakfast    [MAR Hold] cefepime  2,000 mg IntraVENous Q12H    [MAR Hold] metroNIDAZOLE  500 mg Oral 3 times per day    [MAR Hold] amLODIPine  10 mg Oral Daily    [MAR Hold] aspirin  81 mg Oral Daily    [MAR Hold] furosemide  20 mg Oral Daily    [MAR Hold] lisinopril  40 mg Oral Daily    [MAR Hold] gabapentin  100 mg Oral Nightly    [MAR Hold] sodium chloride flush  5-40 mL IntraVENous 2 times per day    [MAR Hold] vancomycin (VANCOCIN) IV  1,000 mg IntraVENous Q24H   Continuous   [MAR Hold] sodium chloride         Physical Exam  BP 118/73   Pulse 94   Temp 98.2 F (36.8 C) (Oral)   Resp 16   Ht 1.676 m (5\' 6" )   Wt 59.2 kg (130 lb 8.2 oz)   SpO2 100%   BMI 21.07 kg/m    General: No acute distress elderly female   Eye: EOMI.  Normal conjunctiva.  HENT: Normocephalic.  Normal hearing.  Moist oral mucosa.   Neck: Supple.  Non-tender.  Lungs: Clear to auscultation.  Breathing non-labored.  Heart: Normal rate, regular rhythm.  No murmur or gallop.   Abdomen: Bowel sounds present x4.  Abdomen soft, non-distended, non-tender to palpation.   Musculoskeletal:  No joint effusions.  No LE edema   Skin: Skin is warm, right heel dressed.    Neurologic: Awake.  Alert.  No focal deficits.  No tremors.  Poor historian.   Psychiatric: Calm.  Pleasant and cooperative.        Imaging/Diagnostics Last 48 Hours  No results found.    Lab Results  Recent Labs     08/07/22  0813 08/08/22  0518 08/09/22  0830   WBC 6.3 6.4 6.6   RBC 3.52* 3.51* 3.44*   HGB 10.0* 9.9* 9.8*   HCT 28.3* 28.6* 27.8*   MCV 80.4* 81.5 80.8*   RDW 13.4 13.2 13.2   PLT 218 211 208     Recent Labs     08/07/22  0813 08/08/22  0518 08/09/22  0830   NA 140 141 141   K 3.7 3.6 3.7   CL 106 105 106   CO2 25 26 25    BUN 11 9 11    CREATININE 0.6 0.7 0.7   GLUCOSE 84 87 89     Estimated Creatinine Clearance: 48 mL/min (based on SCr of 0.7  mg/dL).    Recent Labs     08/06/22  1537   AST 16   ALT 13   BILITOT 0.20   ALKPHOS 71       Assessment/Plan    Right heel deep soft tissue ulcer, cellulitis and possible early osteomyelitis.     Admitted with right heel pain.  Unclear how long ulcer has been present. CT confirmed right heel cellulitis, no gas, abscess and possible osteo of retro calcaneous. MRI deferred.  Has PAD based on arterial studies. Has been evaluated by Dr Sandrea Hughs and plans for operative debridement today and revascularization on Monday per Vascular surgery.  Primary pathogen unknown.  Suspect polymicrobial infection.  MRSA PCR +.      Recommend:   Continue Vancomycin   Continue Cefepime 2 G IV  q 12 hours  Continue  Flagyl 500 mg TID  FU operative findings, culture results  De escalate antibiotics accordingly   Continue wound care  Place PICC at discharge   Anticipate 2 to 6 weeks of antibiotic therapy based on findings above.  HH orders already placed for 6 weeks.   FU with [facility] as OP- Call [phone] for appointment.          Chaston Bradburn Lorette Ang, APRN - NP

## 2022-08-09 NOTE — Care Coordination-Inpatient (Signed)
08/07/22 1034   Service Assessment   Patient Orientation Alert and Oriented   Cognition Alert   History Provided By Patient   Primary Caregiver Family   Support Systems Family Members   PCP Verified by CM Yes  (Dr. Clydene Pugh)   Prior Functional Level Assistance with the following:;Bathing;Dressing;Toileting;Feeding;Housework;Shopping;Mobility   Current Functional Level Assistance with the following:;Bathing;Toileting;Feeding;Housework;Shopping;Mobility   Can patient return to prior living arrangement Yes   Ability to make needs known: Good   Family able to assist with home care needs: Yes   Would you like for me to discuss the discharge plan with any other family members/significant others, and if so, who? Yes  (Tabitha Wilson Fish farm manager))   Social/Functional History   Lives With Family   Type of Home House   Home Layout One level   Home Equipment [place], standard;Cane   Receives Help From Family;Home health  Frances [place])   ADL Assistance Needs assistance   Toileting Needs assistance   Homemaking Assistance Needs assistance   Homemaking Responsibilities No   Transfer Assistance Needs assistance   Active Driver No   Occupation Unemployed   Discharge Planning   Type of Residence House   Patient expects to be discharged to: House     08-07-22 (SG)- Float CM assigned to this pt on this day. Pt admitted for heel ulcer with necrosis of muscle. CM met with pt at bedside to complete the assessment. Pt reports that she lives with family and requires assistance with ADL's. Pt has a walker and a cane at home and is currently open with [facility]. Dr. Clydene Pugh is her PCP and [facility] is the preferred pharmacy. Updates sent to [facility]. Dispo needs pending clinical course.     08/09/22 Reviewed with provider and patient is to undergo an I&D today. She has HH/HI orders for IV abx. Referral and updates sent to [facility] and [facility]. Anticipated DC home Sunday if stable and services available.

## 2022-08-09 NOTE — Progress Notes (Signed)
Acute Care Physical Therapy Treatment Note  Inpatient   (PTA/PT Visit Days : 3)  Time In: 1203 Time Out: 1236 (33 minutes)  Acknowledge Orders  PT Charge Capture    Admitting Diagnosis:  Heel ulcer, right, with necrosis of muscle (HCC) [L97.413]  Pressure injury of skin of right heel, unspecified injury stage [L89.619]  Non-healing open wound of left heel [S91.302A]  Procedure(s) (LRB):  RIGHT HEEL DEBRIDEMENT (Right) Day of Surgery  Treatment Diagnosis: Unsteadiness on feet (R26.81)   Payor: MEDICARE / Plan: MEDICARE PART A AND B / Product Type: *No Product type* /   SUBJECTIVE   Allison [name] is a 89 y.o. female admitted with Heel ulcer, right, with necrosis of muscle (HCC). She  has a past medical history of DVT (deep venous thrombosis) (HCC), Hypertension, and Pulmonary embolism (HCC).  She also  has no past surgical history on file.    Subjective: Pt agreeable- reports eyes hurt when she has them open "they burn"     Additional Pertinent History: Additional Pertinent Hx: Pt admitted c R heel ulcer, also sacral breakdown noted by WOCN.      Social Environment Prior Level of Function   Lives With: Family  Type of Home: House  Home Layout: One level  Home Equipment: Gilmer Mor, Environmental consultant, rolling  Receives Help From: Family, Home health  ADL Assistance: Needs assistance  Homemaking Assistance: Needs assistance  Homemaking Responsibilities: No  Ambulation Assistance: Needs assistance  Transfer Assistance: Needs assistance  Active Driver: No  Occupation: Unemployed   History of Falls: No    Comments:        OBJECTIVE     Vital Signs/Pain Lines/Drains/Precautions   Vital Signs       Pain   Peripheral IV 08/07/22 Left;Anterior Forearm (Active)       External Urinary Catheter (Active)       Precautions/Restrictions  Restrictions/Precautions  Restrictions/Precautions: Fall Risk (Simultaneous filing. User may not have seen previous data.)  Required Braces or Orthoses?: No     Orientation/Cognition Vision/Hearing    Cognition  Overall Cognitive Status: WFL  Arousal/Alertness: Appropriate responses to stimuli  Following Commands: Follows one step commands consistently  Attention Span: Attends with cues to redirect  Insights: Decreased awareness of deficits  Initiation: Requires cues for some  Sequencing: Requires cues for some  Orientation  Orientation Level: Oriented to person, Oriented to place, Oriented to situation       [employer] AM-PACT "6 Clicks" Basic Mobility Inpatient Short Form   How much help is needed turning from your back to your side while in a flat bed without using bedrails?: A Lot  How much help is needed moving from lying on your back to sitting on the side of a flat bed without using bedrails?: A Lot  How much help is needed moving to and from a bed to a chair?: A Lot  How much help is needed standing up from a chair using your arms?: A Lot  How much help is needed walking in hospital room?: A Little  How much help is needed climbing 3-5 steps with a railing?: A Lot  AM-PAC Inpatient Mobility Raw Score : 13  AM-PAC Inpatient T-Scale Score : 36.74  Mobility Inpatient CMS 0-100% Score: 64.91  Mobility Inpatient CMS G-Code Modifier : CL      TREATMENT     Bed Mobility   Rolling to Right: Minimal assistance;Moderate assistance  Supine to Sit: Minimal assistance;Moderate assistance  Sit to Supine: Minimal  assistance  Bed Mobility Comments: Fair bed mobility     Transfer Training   Sit to Stand: Minimal Assistance;Moderate Assistance  Stand to Sit: Minimal Assistance  Comment: Fair mobility     Ambulation   Surface: Level tile  Device: Rolling Walker  Assistance: Minimal assistance;Moderate assistance  Quality of Gait: fair but assist with walker steering  Gait Deviations: Slow Cadence  Distance: 50'     Therapeutic Activity (26 Minutes) CPT 97530: Therapeutic activity included Rolling, Supine to Sit, Ambulation on level ground, Sitting balance , and Standing balance to improve functional Activity  tolerance, Balance, Mobility, and Strength.  Safety: Type of Devices: Left in bed;Bed alarm in place;Call light within reach;Nurse notified     Education:      ASSESSMENT   Assessment:  Pt seen for impaired mobility. Pt need mod assist for bed mobility. Pt slow with limited assist 2; eyes closed. Pt with brief donned prior to amb- Pt positioned back to bed with nursing present. Pt would benefit from continued skilled PT services to improve her function. Cont with POC.       POST ACUTE RECOMMENDATIONS   Setting: Short Term Skilled Nursing Facility  Versus home with HHPT and family    Justification for Recommended Setting: Recommended to help patient obtain maximum functional independence.     Equipment:       Discharge Transportation Recommendations: Stretcher or requires skilled assist for transfer     PLAN   Frequency & Duration: 6 times/week for duration of hospital stay or until stated goals are met, whichever comes first.    Interventions Planned (Treatment may consist of any combination of the following):  Current Treatment Recommendations: Strengthening; ROM; Balance training; Functional mobility training; Transfer training; ADL/Self-care training; Gait training; Home exercise program; Therapeutic activities; Safety education & training      Goals    Short Term Goals Long Term Goals   Time Frame for Short Term Goals: 4 txs from 08/07/22  Short Term Goal 1: Pt will perform sit-stand cga/min A c improved balance.  Short Term Goal 2: Pt will scoot to EOB sba.  Short Term Goal 3: Pt will ambulate 18ft cga/min A c RW.-partially met 08-09-22 Time Frame for Long Term Goals : 8 txs from 08/07/22  Long Term Goal 1: Pt will perform sit-stand sba.  Long Term Goal 2: Pt will ambulate 1ft cga/min A c RW.      Therapist Signature: Massie Maroon, PTA    Date: 08/09/2022

## 2022-08-09 NOTE — Progress Notes (Signed)
[facility] Hospitalist Service     Hospitalist Progress Note     PCP: Arneta Cliche, NP-C   Admission Date: 08/06/2022         Subjective     Hemodynamically stable, afebrile. RA.   Hemoglobin stable.  No growth on blood cultures.    Patient is doing well this morning.  Still with right heel pain.  She endorses that she slept over all night and did not have much in terms of restless leg pain.  She otherwise denies fevers, chills, nausea or vomiting.  She has her eyes closed throughout the initial exam, but continues to answer questions appropriately.  Her grandson is at bedside.  We reviewed the importance of revascularization in addition to debridement IV antibiotics.  He is understanding.  He reiterates that there is someone at the house with her 24 hours a day to assist.     Physical Exam   Vitals: BP 118/73   Pulse 94   Temp 98.2 F (36.8 C) (Oral)   Resp 16   Ht 1.676 m (5\' 6" )   Wt 59.2 kg (130 lb 8.2 oz)   SpO2 100%   BMI 21.07 kg/m Temp (24hrs), Avg:98.5 F (36.9 C), Min:98.1 F (36.7 C), Max:98.8 F (37.1 C)    General - awake and responsive, cooperative with examination. Thin appearing.  Cardio - RRR, without MRG  Pulm - CTA bilaterally, no wheezing, normal respiratory effort   GI - soft, ND NT with normoactive BS, absent guarding or rebound tenderness  Vascular - good peripheral pulses bilateral UE / LE, no edema.   MS - symmetrical muscle strength bilateral upper and lower extremities  Skin - no rashes. Patient with eschar over right heel with loss of soft tissue. Boggy tissue around ulcer wall, painful on palpation. Has increased odor as well. No purulence on bandage.   Neuro - no focal weakness, no slurred speech, no facial droop  Psychiatric - appropriate mood and affect       Labs and Imaging   Data: I have personally reviewed all lab results and independently reviewed imaging studies performed in the past 24 hours.    Hematologic/Coags Chemistries   Recent Labs     08/06/22  1537  08/07/22  0813 08/08/22  0518 08/09/22  0830   WBC 6.8 6.3 6.4 6.6   HGB 11.4* 10.0* 9.9* 9.8*   HCT 32.6* 28.3* 28.6* 27.8*   PLT 248 218 211 208       Lab Results   Component Value Date/Time    PROT 7.2 08/06/2022 03:37 PM    ALBUMIN 3.8 08/06/2022 03:37 PM     No components found for: "HGBA1C"  Lab Results   Component Value Date/Time    INR 1.1 10/09/2020 04:23 PM    PROTIME 14.3 10/09/2020 04:23 PM     Lab Results   Component Value Date/Time    APTT 164.9 10/10/2020 03:39 AM     No results found for: "DDIMER"   Recent Labs     08/06/22  1537 08/07/22  0813 08/08/22  0518 08/09/22  0830   NA 141 140 141 141   K 4.4 3.7 3.6 3.7   CL 103 106 105 106   CO2 28 25 26 25    BUN 16 11 9 11    CREATININE 0.8 0.6 0.7 0.7   ALBUMIN 3.8  --   --   --    BILITOT 0.20  --   --   --  ALKPHOS 71  --   --   --    AST 16  --   --   --    ALT 13  --   --   --        No results for input(s): "GLU" in the last 72 hours.  No results found for: "CPK", "CKMB", "TROPONINI"  Lab Results   Component Value Date/Time    IRON 32 08/06/2022 03:37 PM    FERRITIN 125.0 08/06/2022 03:37 PM        Inflammatory/Respiratory Diabetes   No results found for: "CRP"  No results found for: "ESR"  ABGs:  No results found for: "PHART", "PO2ART", "HCO3", "PCO2ART"   Lab Results   Component Value Date/Time    CREATININE 0.7 08/09/2022 08:30 AM              Vascular ankle brachial index (ABI)   Final Result      CT FOOT RIGHT W CONTRAST   Final Result   Edema and skin thickening superficial to the retrocrural calcaneus consistent    with cellulitis. No gas within the soft tissues or organized drainable abscess.    Subtle areas of loss of cortex could represent early osteomyelitis of the    retrocalcaneus.          Scheduled Meds:   . lidocaine       . [MAR Hold] ferrous gluconate  324 mg Oral Daily with breakfast   . [MAR Hold] cefepime  2,000 mg IntraVENous Q12H   . [MAR Hold] metroNIDAZOLE  500 mg Oral 3 times per day   . [MAR Hold] amLODIPine  10 mg Oral  Daily   . [MAR Hold] aspirin  81 mg Oral Daily   . [MAR Hold] furosemide  20 mg Oral Daily   . [MAR Hold] lisinopril  40 mg Oral Daily   . [MAR Hold] gabapentin  100 mg Oral Nightly   . [MAR Hold] sodium chloride flush  5-40 mL IntraVENous 2 times per day   . [MAR Hold] vancomycin (VANCOCIN) IV  1,000 mg IntraVENous Q24H     Continuous Infusions:   . [MAR Hold] sodium chloride         Assessment and Plan:  Principal Problem:    Heel ulcer, right, with necrosis of muscle (HCC)  Resolved Problems:    * No resolved hospital problems. *    Non-healing right heel ulceration  Cellulitis of the heel  Patient has some tenderness to palpation. Had some bloody drainage at home, but she is on Eliquis. Vitals stable, lactate normal, no leukocytosis or left shift. CT right foot with evidence for cellulitis, but not obvious for osteomyelitis. Called questionable early osteo, but with no WBC other SIRS I have a low suspicion that MRI and further osteo. MRSA positive.   -Initially on Vanc, Zosyn. Transitioned to Cefepime, Vanc, flagyl per ID recommendations.  -Appreciate ongoing ID recommendations. Will require at least 2-6 weeks of IV antibiotics.  appreciate ID recommendations today. Suspect she will need more prolonged IV antibx until her debridement and vascular intervention occurs.   -Appreciate ongoing podiatry recommendations as well as wound care needs.  -Appreciate vascular surgery recommendations. Will be able to pursue revascularization as an OP on Monday. Therefore, getting PICC line placed today post op. Home IV antibiotics to be set up with CM assistance. If tolerates procedure well and is doing well tomorrow a.m. will DC home with family awaiting procedure on Monday.  -Will hold eliquis  until after procedure on Monday.   -Will ask for wound care nursing consult.   -Appreciate PT/OT recommendations.     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 Likely  contributing to her wound as above.   -Appreciate vascular surgery recommendations regarding timing for intervention, and sounds to be planned for this upcoming Monday.      HTN  -Continue home lisinopril, lasix, amlodipine     Hx DVT and PE  -Hold home eliquis as above.     Nightly rest leg pain  Arterial doppler as above.   -Will check iron studies as this could be the culprit with her mildly low microcytic anemia. Especially with her chronic eliquis use she could have some mild chronic blood loss contributing to this. Iron level is low, and will start oral iron supplementation.  -Continue home gabapentin.     Hyperglycemia, resolved    Agree with stage III sacral pressure injury present on admission as documented by wound care RN      Full code. Discussed with patient on admission and she would like to be full code.  Inpatient pending further management of her cellulitis. Once confirmed IV antibx are set up and picc is placed, will DC home with family. Appreciate CM assistance.   SCDs      Shona Needles, MD  08/09/2022 1:07 PM  [facility] Hospitalist Service

## 2022-08-10 LAB — CULTURE, FUNGUS
KOH result: NONE SEEN
KOH result: NONE SEEN

## 2022-08-10 LAB — CBC WITH AUTO DIFFERENTIAL
Absolute Baso #: 0 10*3/uL (ref 0.0–0.2)
Absolute Eos #: 0 10*3/uL (ref 0.0–0.5)
Absolute Lymph #: 1.2 10*3/uL (ref 1.0–3.2)
Absolute Mono #: 0.6 10*3/uL (ref 0.3–1.0)
Basophils %: 0.1 % (ref 0.0–2.0)
Eosinophils %: 0 % (ref 0.0–7.0)
Hematocrit: 28.5 % — ABNORMAL LOW (ref 34.0–47.0)
Hemoglobin: 10 g/dL — ABNORMAL LOW (ref 11.5–15.7)
Immature Grans (Abs): 0.04 10*3/uL (ref 0.00–0.06)
Immature Granulocytes %: 0.5 % (ref 0.0–0.6)
Lymphocytes: 15.8 % (ref 15.0–45.0)
MCH: 28.5 pg (ref 27.0–34.5)
MCHC: 35.1 g/dL (ref 32.0–36.0)
MCV: 81.2 fL (ref 81.0–99.0)
MPV: 10.4 fL (ref 7.2–13.2)
Monocytes: 7.8 % (ref 4.0–12.0)
NRBC Absolute: 0 10*3/uL (ref 0.000–0.012)
NRBC Automated: 0 % (ref 0.0–0.2)
Neutrophils %: 75.8 % — ABNORMAL HIGH (ref 42.0–74.0)
Neutrophils Absolute: 5.8 10*3/uL (ref 1.6–7.3)
Platelets: 223 10*3/uL (ref 140–440)
RBC: 3.51 x10e6/mcL — ABNORMAL LOW (ref 3.60–5.20)
RDW: 13.3 % (ref 11.0–16.0)
WBC: 7.6 10*3/uL (ref 3.8–10.6)

## 2022-08-10 LAB — BASIC METABOLIC PANEL W/ REFLEX TO MG FOR LOW K
Anion Gap: 8 mmol/L (ref 2–17)
BUN: 13 mg/dL (ref 8–23)
CO2: 27 mmol/L (ref 22–29)
Calcium: 8.5 mg/dL (ref 8.5–10.7)
Chloride: 106 mmol/L (ref 98–107)
Creatinine: 0.7 mg/dL (ref 0.5–1.0)
Est, Glom Filt Rate: 81 mL/min/1.73m (ref >=60)
Glucose: 117 mg/dL — ABNORMAL HIGH (ref 70–99)
OSMOLALITY CALCULATED: 282 mOsm/kg (ref 270–287)
Potassium: 4.1 mmol/L (ref 3.5–5.3)
Sodium: 141 mmol/L (ref 135–145)

## 2022-08-10 LAB — CULTURE, TISSUE: FINAL REPORT: NORMAL — AB

## 2022-08-10 LAB — VANCOMYCIN LEVEL, TROUGH: Vancomycin Tr: 13.5 ug/mL (ref 10.0–20.0)

## 2022-08-10 MED ORDER — FERROUS GLUCONATE 324 (38 FE) MG PO TABS
32438 (38 Fe) MG | ORAL_TABLET | Freq: Every day | ORAL | 3 refills | Status: AC
Start: 2022-08-10 — End: ?

## 2022-08-10 MED ORDER — METRONIDAZOLE 500 MG PO TABS
500 | ORAL_TABLET | Freq: Three times a day (TID) | ORAL | 0 refills | Status: DC
Start: 2022-08-10 — End: 2022-08-10

## 2022-08-10 MED ORDER — CIPROFLOXACIN HCL 250 MG PO TABS
250 MG | ORAL_TABLET | Freq: Two times a day (BID) | ORAL | 0 refills | Status: DC
Start: 2022-08-10 — End: 2022-08-10

## 2022-08-10 MED ORDER — AMOXICILLIN-POT CLAVULANATE 875-125 MG PO TABS
875-125 MG | ORAL_TABLET | Freq: Two times a day (BID) | ORAL | 0 refills | Status: AC
Start: 2022-08-10 — End: 2022-08-31

## 2022-08-10 MED ORDER — FLUCONAZOLE 150 MG PO TABS
150 MG | ORAL_TABLET | Freq: Once | ORAL | 0 refills | Status: AC
Start: 2022-08-10 — End: 2022-08-10

## 2022-08-10 MED ORDER — DOXYCYCLINE HYCLATE 100 MG PO TABS
100 MG | ORAL_TABLET | Freq: Two times a day (BID) | ORAL | 0 refills | Status: AC
Start: 2022-08-10 — End: 2022-08-31

## 2022-08-10 MED FILL — MONOJECT FLUSH SYRINGE 0.9 % IV SOLN: 0.9 % | INTRAVENOUS | Qty: 10

## 2022-08-10 MED FILL — CEFEPIME HCL 2 G IV SOLR: 2 g | INTRAVENOUS | Qty: 2

## 2022-08-10 MED FILL — AMLODIPINE BESYLATE 10 MG PO TABS: 10 MG | ORAL | Qty: 1

## 2022-08-10 MED FILL — LISINOPRIL 20 MG PO TABS: 20 MG | ORAL | Qty: 2

## 2022-08-10 MED FILL — ACETAMINOPHEN 650 MG RE SUPP: 650 MG | RECTAL | Qty: 1

## 2022-08-10 MED FILL — CETIRIZINE HCL 10 MG PO TABS: 10 MG | ORAL | Qty: 1

## 2022-08-10 MED FILL — FERROUS GLUCONATE 324 (38 FE) MG PO TABS: 324 (38 Fe) MG | ORAL | Qty: 1

## 2022-08-10 MED FILL — METRONIDAZOLE 250 MG PO TABS: 250 MG | ORAL | Qty: 2

## 2022-08-10 MED FILL — ACETAMINOPHEN 325 MG PO TABS: 325 MG | ORAL | Qty: 2

## 2022-08-10 MED FILL — MONOJECT FLUSH SYRINGE 0.9 % IV SOLN: 0.9 % | INTRAVENOUS | Qty: 40

## 2022-08-10 MED FILL — FUROSEMIDE 20 MG PO TABS: 20 MG | ORAL | Qty: 1

## 2022-08-10 MED FILL — ASPIRIN LOW STRENGTH 81 MG PO CHEW: 81 MG | ORAL | Qty: 1

## 2022-08-10 MED FILL — GABAPENTIN 100 MG PO CAPS: 100 MG | ORAL | Qty: 1

## 2022-08-10 NOTE — Progress Notes (Addendum)
Infectious Diseases Progress Note  Date: 08/10/2022  Patient Name: Allison [name]  Date of Birth: Jun 17, 1928  Age: 89 y.o.  Hospital Day: 3       History of Present Illness    Seen this a.m. no family at bedside.  Tolerating antibiotics.  Right heel dressed.  Operative note reviewed  and cultures updated. Afebrile.       Review of Systems  Constitutional: No fevers, chills, or sweats.  Eye: No recent visual problems.  ENMT: No ear pain.  No nasal congestion.  No sore throat.  Respiratory: No shortness of breath or cough.  Cardiovascular: No chest pain, palpitations, or syncope.  Gastrointestinal: No nausea, vomiting, diarrhea, constipation, or abdominal pain.  Genitourinary: No dysuria or hematuria.       Inpatient Medications  Scheduled   cetirizine  5 mg Oral Daily    ferrous gluconate  324 mg Oral Daily with breakfast    cefepime  2,000 mg IntraVENous Q12H    metroNIDAZOLE  500 mg Oral 3 times per day    amLODIPine  10 mg Oral Daily    aspirin  81 mg Oral Daily    furosemide  20 mg Oral Daily    lisinopril  40 mg Oral Daily    gabapentin  100 mg Oral Nightly    sodium chloride flush  5-40 mL IntraVENous 2 times per day    vancomycin (VANCOCIN) IV  1,000 mg IntraVENous Q24H   Continuous   sodium chloride         Physical Exam  BP (!) 140/66   Pulse 68   Temp 98 F (36.7 C) (Oral)   Resp 18   Ht 1.676 m (5\' 6" )   Wt 59.1 kg (130 lb 4.7 oz)   SpO2 100%   BMI 21.03 kg/m    General: No acute distress elderly female   Eye: EOMI.  Normal conjunctiva.  HENT: Normocephalic.  Normal hearing.  Moist oral mucosa.   Neck: Supple.  Non-tender.    Lungs: Clear to auscultation.  Breathing non-labored.  Heart: Normal rate, regular rhythm.  No murmur or gallop.   Abdomen: Bowel sounds present x4.  Abdomen soft, non-distended, non-tender to palpation.   Musculoskeletal:  No joint effusions. No LE edema   Skin: Skin is warm, right heel dressed.    Neurologic: Awake.  Alert.  No focal deficits.  No tremors.  Poor  historian.   Psychiatric: Calm.  Pleasant and cooperative.        Imaging/Diagnostics Last 48 Hours  No results found.    Lab Results  Recent Labs     08/08/22  0518 08/09/22  0830 08/10/22  0638   WBC 6.4 6.6 7.6   RBC 3.51* 3.44* 3.51*   HGB 9.9* 9.8* 10.0*   HCT 28.6* 27.8* 28.5*   MCV 81.5 80.8* 81.2   RDW 13.2 13.2 13.3   PLT 211 208 223     Recent Labs     08/08/22  0518 08/09/22  0830 08/10/22  0638   NA 141 141 141   K 3.6 3.7 4.1   CL 105 106 106   CO2 26 25 27    BUN 9 11 13    CREATININE 0.7 0.7 0.7   GLUCOSE 87 89 117*     Estimated Creatinine Clearance: 48 mL/min (based on SCr of 0.7 mg/dL).    No results for input(s): "AST", "ALT", "BILIDIR", "BILITOT", "ALKPHOS", "INR" in the last 72 hours.  Assessment/Plan    Right heel deep soft tissue ulcer, cellulitis and possible early osteomyelitis.     Admitted with right heel pain and Eschar covering right heel wound.  Unclear how long ulcer has been present. CT confirmed right heel cellulitis, no gas, abscess and possible osteo of retro calcaneous. MRI deferred.  Has PAD based on arterial studies.  Underwent excisional debridement of all infected and nonviable tissue right foot 4/27 by Dr. Sandrea Hughs.  Operative note reflects microthrombi within wound, no liquefactive tissue present,  and wound did not track to bone.  All necrotic tissue removed.  Plans for revascularization on Monday per Vascular surgery.  Primary pathogen unknown.  Suspect polymicrobial infection.  MRSA PCR +.  Operative tissue culture 4/27 with GPC's and GPR. Currently on empiric  vancomycin, cefepime and oral Flagyl.       Recommend:   Follow-up final culture results  If plans for discharge today in anticipation of revascularization procedure tomorrow can DC on: Augmentin 875 mg bid and doxy 100 mg bid   Based on clinical coarse will determine duration of therapy. (2 to 3 weeks)   Continue diligent wound care and pressure offloading  FU with LCIDS in 2 weeks.       MRSA PCR +     Reflex  colonization.  Contact precautions.          Antibiotic plan discussed with Dr Guilford Shi         Derenda Fennel, APRN - NP

## 2022-08-10 NOTE — Discharge Summary (Signed)
Allison [name]    Patient Name:  Allison [name]    Patient DOB:  June 27, 1928   MRN:  [id number]   Admit date:  08/06/2022   Discharge date:   08/10/2022    Admitting Physician:  Shona Needles, MD    Discharge Physician:  Lanelle Bal, MD    Discharge Diagnoses:  Non-healing right heel ulceration with cellulitis s/p debridement   RLE PAD   HTN   HX of DVT and PE   Consults:   IP CONSULT TO PHARMACY  IP CONSULT TO PHARMACY TECH  IP CONSULT TO PODIATRY  IP CONSULT TO PICC TEAM  IP CONSULT TO VASCULAR SURGERY  IP CONSULT TO INFECTIOUS DISEASES   Code status  Full Code    Disposition:  Home with home health          Hospital Stay   HPI (from admission H&P):     Allison [name] is a 89 y.o. female with a PMHx significant for hx DVT and PE, HTN, peripheral neuropathy, who presented to the hospital with her granddaughter secondary to a nonhealing right heel ulceration.  Patient states that about 3 weeks ago she noticed the ulcer, and it has not gotten better over that.  Time.  She does have pain especially in the right leg at nighttime.  She states it becomes sharp and stabbing pain.  She usually is able to get up and ambulate around the house with her walker, but is unclear how sedentary she is overall.  She does have home health services coming out to her house as well as a home health aide that assists with bathing.  She denies fevers, chills, chest pain, shortness of breath, abdominal pain, diarrhea.  She has been able to ambulate on the right foot, and again endorses that most of her pain is at nighttime.  She has noted that it has had increased drainage recently.     On arrival to the ER patient had stable vitals.  She was afebrile.  No leukocytosis, no lactate.  Renal function appropriate.  No left shift on CBC.  Cultures were obtained, and patient was given some fluids.  Started on vancomycin and Zosyn.  CT scan was performed, but awaiting formal read.  Hospitalist service was called as  there were concerns for need for wound debridement.     Patient no longer smokes.  She does not drink alcohol.  Her granddaughter is at bedside, and states her last dose of Eliquis was this morning.  She is compliant with her medications.  They have been following with [facility], and had an ultrasound recently.  Reviewed chart, it appears that this was done in January 2024.  Preliminary comments on absence of DVT, but no arterial studies.    Hospital Course:     Non-healing right heel ulceration  Cellulitis of the heel  Vitals stable, lactate normal, no leukocytosis or left shift. CT right foot with evidence for cellulitis, but not obvious for osteomyelitis. Called questionable early osteo but low suspicion. MRSA positive. She was on vanco, cefepime, and flagyl per ID recommendations. Podiatry was consulted as well as vascular surgery given PAD RLE. She underwent debridement R heel which showed infection did not reach the bone. Vascular surgery is planning for revascularization as outpatient on 4/29 (day after discharge). Discussed with ID, okay to transition to PO antibiotics given lack of osteo and revascularization occurring tomorrow. Will send augmentin and doxycycline x 2-3 weeks, pending how  wound does. Will have patient follow up closely with Dr. Sandrea Hughs and ID as well as vascular surgery. We have set up wound care via home health. I will follow up wound culture results.      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 Dr. Marijo Sanes was following and planning for outpatient RLE angiogram this upcoming Monday 4/29. I have asked patient to continue to hold eliquis until after this procedure.     Discharge Exam:  Blood pressure (!) 140/66, pulse 68, temperature 98 F (36.7 C), temperature source Oral, resp. rate 18, height 1.676 m (5\' 6" ), weight 59.1 kg (130 lb  4.7 oz), SpO2 100 %. Body mass index is 21.03 kg/m.    General - awake and responsive, cooperative with examination  Cardio - RRR, without MRG  Pulm - CTA bilaterally, no wheezing, normal respiratory effort   GI - soft, ND NT with normoactive BS, absent guarding or rebound tenderness  Vascular - good peripheral pulses bilateral UE / LE, no edema.   MS - symmetrical muscle strength bilateral upper and lower extremities  Skin - no rashes. Right heel ulcer s/p debridement covered with dressing and ACE bandage that were placed after procedure yesterday, no visible drainage   Neuro - no focal weakness, no slurred speech, no facial droop  Psychiatric - appropriate mood and affect    Surgeries/Procedures Performed:  Procedure(s):  RIGHT HEEL DEBRIDEMENT      Microbiology:  Organism   Date Value Ref Range Status   08/31/2020 Escherichia coli  *See Final Report*   (A)  Final       Recent Labs: CBC:   Lab Results   Component Value Date/Time    WBC 7.6 08/10/2022 06:38 AM    RBC 3.51 08/10/2022 06:38 AM    HGB 10.0 08/10/2022 06:38 AM    HCT 28.5 08/10/2022 06:38 AM    MCV 81.2 08/10/2022 06:38 AM    MCH 28.5 08/10/2022 06:38 AM    MCHC 35.1 08/10/2022 06:38 AM    RDW 13.3 08/10/2022 06:38 AM    PLT 223 08/10/2022 06:38 AM     BMP:    Lab Results   Component Value Date/Time    GLUCOSE 117 08/10/2022 06:38 AM    NA 141 08/10/2022 06:38 AM    K 4.1 08/10/2022 06:38 AM    CL 106 08/10/2022 06:38 AM    CO2 27 08/10/2022 06:38 AM    ANIONGAP 8 08/10/2022 06:38 AM    BUN 13 08/10/2022 06:38 AM    CREATININE 0.7 08/10/2022 06:38 AM    CALCIUM 8.5 08/10/2022 06:38 AM    LABGLOM 81 08/10/2022 06:38 AM    GFRAA 51 08/31/2020 12:03 AM       Imaging:  CT FOOT RIGHT W CONTRAST    Result Date: 08/06/2022  Edema and skin thickening superficial to the retrocrural calcaneus consistent with cellulitis. No gas within the soft tissues or organized drainable abscess. Subtle areas of loss of cortex could represent early osteomyelitis of the  retrocalcaneus.    04/29/21    TRANSTHORACIC ECHOCARDIOGRAM (TTE) COMPLETE (CONTRAST/BUBBLE/3D PRN) 05/01/2021  5:32 PM, 05/01/2021 12:00 AM (Final)    Interpretation [name]    Left Ventricle: Normal left ventricular systolic function with a visually estimated EF of 55 - 60%. Left ventricle size is normal. Normal wall thickness. Grossly normal wall  motion. Abnormal diastolic function.    Aortic Valve: Valve structure is normal. Mild sclerosis of the aortic valve cusp.    Mitral Valve: Valve structure is normal. Mild annular calcification of the mitral valve.    Technical qualifiers: Technically difficult study due to low parasternal window.    Signed by: Allie Bossier, MD on 05/01/2021  5:32 PM, Signed by: Unknown Provider Result on 05/01/2021 12:00 AM       Discharge Medications      Current Outpatient Medications   Medication Instructions    amLODIPine (NORVASC) 10 mg, Oral, DAILY    amoxicillin-clavulanate (AUGMENTIN) 875-125 MG per tablet 1 tablet, Oral, 2 TIMES DAILY    apixaban (ELIQUIS) 2.5 mg, Oral, 2 TIMES DAILY    aspirin 81 mg, Oral, DAILY    diclofenac sodium (VOLTAREN) 2 g, Topical, DAILY    doxycycline hyclate (VIBRA-TABS) 100 mg, Oral, 2 TIMES DAILY    ferrous gluconate (FERGON) 324 mg, Oral, DAILY WITH BREAKFAST    fluconazole (DIFLUCAN) 150 mg, Oral, ONCE    furosemide (LASIX) 20 mg, Oral, DAILY    gabapentin (NEURONTIN) 100 mg, Oral, Nightly    hydrALAZINE (APRESOLINE) 25 mg, Oral, EVERY 8 HOURS SCHEDULED, Hold for SBP<150    lisinopril (PRINIVIL;ZESTRIL) 40 mg, Oral, DAILY    meclizine (ANTIVERT) 25 mg, Oral, 3 TIMES DAILY PRN    omeprazole (PRILOSEC) 20 mg, Oral, ONCE PRN      ---  Current Discharge Medication List             Details   ferrous gluconate (FERGON) 324 (38 Fe) MG tablet Take 1 tablet by mouth daily (with breakfast)  Qty: 30 tablet, Refills: 3      amoxicillin-clavulanate (AUGMENTIN) 875-125 MG per tablet Take 1 tablet by mouth 2 times daily for 21 days  Qty: 42 tablet,  Refills: 0      doxycycline hyclate (VIBRA-TABS) 100 MG tablet Take 1 tablet by mouth 2 times daily for 21 days  Qty: 42 tablet, Refills: 0      fluconazole (DIFLUCAN) 150 MG tablet Take 1 tablet by mouth once for 1 dose  Qty: 2 tablet, Refills: 0                Details   apixaban (ELIQUIS) 2.5 MG TABS tablet Take 1 tablet by mouth 2 times daily      meclizine (ANTIVERT) 25 MG tablet Take 1 tablet by mouth 3 times daily as needed for Dizziness      diclofenac sodium (VOLTAREN) 1 % GEL Apply 2 g topically daily      gabapentin (NEURONTIN) 100 MG capsule Take 1 capsule by mouth at bedtime.      omeprazole (PRILOSEC) 20 MG delayed release capsule Take 1 capsule by mouth once as needed (GERD)      hydrALAZINE (APRESOLINE) 25 MG tablet Take 1 tablet by mouth every 8 hours Hold for SBP<150  Qty: 90 tablet, Refills: 0      lisinopril (PRINIVIL;ZESTRIL) 40 MG tablet Take 1 tablet by mouth daily  Qty: 30 tablet, Refills: 0      amLODIPine (NORVASC) 10 MG tablet Take 1 tablet by mouth daily  Qty: 30 tablet, Refills: 0      furosemide (LASIX) 20 MG tablet Take 1 tablet by mouth daily      aspirin 81 MG chewable tablet Take 1 tablet by mouth daily            Discharge Plan   Provider Follow-Up:  [facility])  [address] [place] [address]  [phone]        Derenda Fennel, APRN - NP  [address] [place] [address]  [phone]    Call  Please call office to make a 2-week follow-up appointment once discharged.    Arneta Cliche, NP-C  [address] [place] [address]  [phone]    Schedule an appointment as soon as possible for a visit in 1 week(s)      Ronnell Guadalajara, DPM  [address] [place]  [address]  [place] [address]  [phone]    Schedule an appointment as soon as possible for a visit in 1 week(s)  Call his office for an appointment next week       In process/preliminary  results:  Outstanding Order Results       Date and Time Order Name Status Description    08/09/2022  2:01 PM Culture, Fungus Preliminary     08/09/2022  1:42 PM Culture, Tissue Test Includes: Gram Stain Preliminary     08/07/2022  8:38 AM MRSA by PCR In process     08/07/2022  8:13 AM Culture, Blood 1 Preliminary     08/06/2022  9:21 PM Culture, Blood 1 Preliminary     08/06/2022  9:00 PM Culture, Blood 1 In process     08/06/2022  3:37 PM Blood Culture 1 Preliminary             Patient Instructions     Discharge Activity:  activity as tolerated   Discharge Diet:  ADULT DIET; Regular; 4 carb choices (60 gm/meal)  ADULT ORAL NUTRITION SUPPLEMENT; Lunch; Wound Healing Oral Supplement    Discharge Wound Care: as directed, we also set up wound care via home health as well. Be sure to see Dr. Sandrea Hughs next week in clinic    Special instructions  Be surgery to attend your vascular surgery tomorrow. Please continue to stop taking eliquis until after the procedure, then resume when it's okay per vascular surgery (be sure you ask them please). I confirmed with infectious disease and they are okay with you switching to oral antibiotics since your infection did not go to your bone and because you are getting your vascular surgery tomorrow. I have sent 3 weeks of antibiotics but you will need to be on them for 2-3 weeks until the wound starts to close and heal over (I would let infectious disease or podiatry guide this). Be sure to follow up with Podiatry (Dr. Sandrea Hughs) in a week so that he can follow your wound.     Seek medical attention with any fevers, new or worsening symptoms, or if your heel is not healing.      Time Spent on Discharge:  35 minutes were spent in patient examination, evaluation, counseling as well as medication reconciliation, prescriptions for required medications, discharge plan and follow up.

## 2022-08-10 NOTE — Discharge Instructions (Addendum)
Be surgery to attend your vascular surgery tomorrow. Please continue to stop taking eliquis until after the procedure, then resume when it's okay per vascular surgery (be sure you ask them please). I confirmed with infectious disease and they are okay with you switching to oral antibiotics since your infection did not go to your bone and because you are getting your vascular surgery tomorrow. I have sent 4 weeks of antibiotics but you will need to be on them for 2-3 weeks until the wound starts to close and heal over (I would let infectious disease or podiatry guide this). Be sure to follow up with Podiatry (Dr. Sandrea Hughs) in a week so that he can follow your wound.     Seek medical attention with any fevers, new or worsening symptoms, or if your heel is not healing.

## 2022-08-10 NOTE — Care Coordination-Inpatient (Addendum)
08/07/22 1034   Service Assessment   Patient Orientation Alert and Oriented   Cognition Alert   History Provided By Patient   Primary Caregiver Family   Support Systems Family Members   PCP Verified by CM Yes  (Dr. Clydene Pugh)   Prior Functional Level Assistance with the following:;Bathing;Dressing;Toileting;Feeding;Housework;Shopping;Mobility   Current Functional Level Assistance with the following:;Bathing;Toileting;Feeding;Housework;Shopping;Mobility   Can patient return to prior living arrangement Yes   Ability to make needs known: Good   Family able to assist with home care needs: Yes   Would you like for me to discuss the discharge plan with any other family members/significant others, and if so, who? Yes  (Tabitha Wilson [employer] farm manager))   Social/Functional History   Lives With Family   Type of Home House   Home Layout One level   Home Equipment [place], standard;Cane   Receives Help From Family;Home health  [facility])   ADL Assistance Needs assistance   Toileting Needs assistance   Homemaking Assistance Needs assistance   Homemaking Responsibilities No   Transfer Assistance Needs assistance   Active Driver No   Occupation Unemployed   Discharge Planning   Type of Residence House   Patient expects to be discharged to: House     08-07-22 (SG)- Float CM assigned to this pt on this day. Pt admitted for heel ulcer with necrosis of muscle. CM met with pt at bedside to complete the assessment. Pt reports that she lives with family and requires assistance with ADL's. Pt has a walker and a cane at home and is currently open with [facility]. Dr. Clydene Pugh is her PCP and [facility] is the preferred [facility]. Updates sent to [facility] Dispo needs pending clinical course.     08/09/22 Reviewed with provider and patient is to undergo an I&D today. She has HH/HI orders for IV abx. Referral and updates sent to [facility] and [facility]. Anticipated DC home Sunday if stable and services available.     08/10/22 Reviewed plan  with provider/RN and [facility]. Per MD patient will now DC or oral abx. [facility] cancelled. IM reviewed Update sent to [facility].

## 2022-08-12 LAB — CULTURE, BLOOD 1

## 2023-02-13 DEATH — deceased
